# Patient Record
Sex: Female | Born: 1935
Health system: Southern US, Community
[De-identification: ages and names within clinical notes are randomized; demographics above are authoritative.]

## PROBLEM LIST (undated history)

## (undated) DIAGNOSIS — H9110 Presbycusis, unspecified ear: Secondary | ICD-10-CM

## (undated) DIAGNOSIS — H547 Unspecified visual loss: Secondary | ICD-10-CM

## (undated) DIAGNOSIS — H269 Unspecified cataract: Secondary | ICD-10-CM

## (undated) DIAGNOSIS — F0391 Unspecified dementia with behavioral disturbance: Secondary | ICD-10-CM

## (undated) HISTORY — DX: Unspecified dementia with behavioral disturbance: F03.91

## (undated) HISTORY — DX: Presbycusis, unspecified ear: H91.10

## (undated) HISTORY — DX: Unspecified cataract: H26.9

## (undated) HISTORY — DX: Unspecified visual loss: H54.7

---

## 2010-03-23 HISTORY — PX: CATARACT EXTRACTION: SUR2

## 2015-06-13 ENCOUNTER — Encounter: Payer: Self-pay | Admitting: Family Medicine

## 2015-06-13 ENCOUNTER — Ambulatory Visit (INDEPENDENT_AMBULATORY_CARE_PROVIDER_SITE_OTHER): Payer: Medicare Other | Admitting: Family Medicine

## 2015-06-13 VITALS — BP 153/72 | HR 67 | Temp 98.3°F | Wt 157.4 lb

## 2015-06-13 DIAGNOSIS — Z23 Encounter for immunization: Secondary | ICD-10-CM

## 2015-06-13 DIAGNOSIS — Z7689 Persons encountering health services in other specified circumstances: Secondary | ICD-10-CM

## 2015-06-13 DIAGNOSIS — R413 Other amnesia: Secondary | ICD-10-CM | POA: Diagnosis not present

## 2015-06-13 DIAGNOSIS — Z7189 Other specified counseling: Secondary | ICD-10-CM

## 2015-06-13 NOTE — Progress Notes (Signed)
Patient ID: Ebony PitchSawai Pierce, female   DOB: 09-May-1935, 80 y.o.   MRN: [id number]   [facility] Yolande Jollyaleb G Jamaine Quintin, MD Phone: [phone]  Subjective:   # Encounter to Establish Care - pt. Presents today to establish care.   Medical History - GERD  Surgical History - Tubal Ligation.   Medications - Multivitamin  Allergies - No known allergies  Family History Husband - DM, Brain Cancer - Deceased Parents - HTN  Social History - Does not Smoke or use tobacco products.  - No other illegal substances - No ETOH - No [place] nut.  - Lives in [place], and travels frequently back and forth to [place]. [place].   Upon ROS - daughter mentions that she has poor memory.  - She would like to get her to take the test for citizenship, but she is unsure if she can complete it since her cognitive function has been declining. - She is worried that her mother has dementia.  - She has been leaving the water on, forgetting that she is eating.   All relevant systems were reviewed and were negative unless otherwise noted in the HPI  Past Medical History Reviewed problem list.  Medications- reviewed and updated Current Outpatient Prescriptions  Medication Sig Dispense Refill  . MULTIPLE VITAMIN PO Take by mouth daily.     No current facility-administered medications for this visit.   Chief complaint-noted No additions to family history Social history- patient is a non smoker  Objective: BP 153/72 mmHg  Pulse 67  Temp(Src) 98.3 F (36.8 C) (Oral)  Wt 157 lb 6.4 oz (71.396 kg) Gen: NAD, alert, cooperative with exam HEENT: NCAT, EOMI, PERRL Neck: FROM, supple CV: RRR, good S1/S2, no murmur Resp: CTABL, no wheezes, non-labored Abd: SNTND, BS present, no guarding or organomegaly Ext: No edema, warm, normal tone, moves UE/LE spontaneously Neuro: Alert and oriented, No gross deficits Skin: no rashes no lesions  Assessment/Plan:  #  Healthcare Maintenance - Has had an EGD but no colonoscopy.  - Needs Flu vaccine - Needs Lipid panel - Needs hepatitis screening.  - CMET, CBC - Needs Dexa - Has had negative mammogram in the past.   # Poor Memory - CMET - CBC - B12, TSH, Folate - Hep C - RPR - Needs MMSE at next office visit.  - F/U for full eval in 1 month.

## 2015-06-13 NOTE — Patient Instructions (Signed)
Thanks for coming in today.   Make an appointment for one 2 weeks to one month to return for further mental evaluation.   We will get routine and screening lab work in the mean time.   On your way out today set up an appointment to get labs drawn one day next week.   If you need anything in the meantime just let me know.   Thanks for letting us take care of you.   Sincerely, Devota Pacealeb Ceniyah Thorp, MD Family Medicine - PGY 2

## 2015-06-18 ENCOUNTER — Other Ambulatory Visit: Payer: Medicare Other

## 2015-06-18 DIAGNOSIS — Z7689 Persons encountering health services in other specified circumstances: Secondary | ICD-10-CM

## 2015-06-18 DIAGNOSIS — R413 Other amnesia: Secondary | ICD-10-CM

## 2015-06-18 LAB — CBC
HEMATOCRIT: 42.6 % (ref 36.0–46.0)
HEMOGLOBIN: 14.4 g/dL (ref 12.0–15.0)
MCH: 29.4 pg (ref 26.0–34.0)
MCHC: 33.8 g/dL (ref 30.0–36.0)
MCV: 86.9 fL (ref 78.0–100.0)
MPV: 9.4 fL (ref 8.6–12.4)
Platelets: 345 10*3/uL (ref 150–400)
RBC: 4.9 MIL/uL (ref 3.87–5.11)
RDW: 14.1 % (ref 11.5–15.5)
WBC: 5.5 10*3/uL (ref 4.0–10.5)

## 2015-06-18 LAB — TSH: TSH: 0.91 m[IU]/L

## 2015-06-18 LAB — HEPATITIS PANEL, ACUTE
HCV AB: NEGATIVE
HEP B C IGM: NONREACTIVE
Hep A IgM: NONREACTIVE
Hepatitis B Surface Ag: NEGATIVE

## 2015-06-18 LAB — LIPID PANEL
CHOLESTEROL: 199 mg/dL (ref 125–200)
HDL: 51 mg/dL (ref >=46)
LDL Cholesterol: 124 mg/dL (ref <130)
TRIGLYCERIDES: 118 mg/dL (ref <150)
Total CHOL/HDL Ratio: 3.9 Ratio (ref <=5.0)
VLDL: 24 mg/dL (ref <30)

## 2015-06-18 LAB — COMPLETE METABOLIC PANEL WITH GFR
ALT: 10 U/L (ref 6–29)
AST: 19 U/L (ref 10–35)
Albumin: 3.7 g/dL (ref 3.6–5.1)
Alkaline Phosphatase: 65 U/L (ref 33–130)
BUN: 17 mg/dL (ref 7–25)
CALCIUM: 9.5 mg/dL (ref 8.6–10.4)
CHLORIDE: 103 mmol/L (ref 98–110)
CO2: 26 mmol/L (ref 20–31)
Creat: 0.83 mg/dL (ref 0.60–0.93)
GFR, Est African American: 79 mL/min (ref >=60)
GFR, Est Non African American: 68 mL/min (ref >=60)
Glucose, Bld: 97 mg/dL (ref 65–99)
POTASSIUM: 4.4 mmol/L (ref 3.5–5.3)
Sodium: 141 mmol/L (ref 135–146)
Total Bilirubin: 0.4 mg/dL (ref 0.2–1.2)
Total Protein: 7.2 g/dL (ref 6.1–8.1)

## 2015-06-18 LAB — PHOSPHORUS: Phosphorus: 3.8 mg/dL (ref 2.1–4.3)

## 2015-06-18 LAB — VITAMIN B12: VITAMIN B 12: 265 pg/mL (ref 200–1100)

## 2015-06-18 LAB — MAGNESIUM: Magnesium: 2 mg/dL (ref 1.5–2.5)

## 2015-06-18 NOTE — Progress Notes (Signed)
Labs done today Ebony Pierce 

## 2015-06-19 LAB — RPR

## 2015-06-20 ENCOUNTER — Telehealth: Payer: Self-pay | Admitting: Family Medicine

## 2015-06-20 NOTE — Telephone Encounter (Signed)
Called with normal lab results. Left VM. Told to call back if any questions.   CGM MD

## 2015-07-02 ENCOUNTER — Encounter: Payer: Self-pay | Admitting: Family Medicine

## 2015-07-02 ENCOUNTER — Ambulatory Visit (INDEPENDENT_AMBULATORY_CARE_PROVIDER_SITE_OTHER): Payer: Medicare Other | Admitting: Family Medicine

## 2015-07-02 VITALS — BP 138/75 | HR 66 | Temp 99.2°F | Ht 60.0 in | Wt 157.3 lb

## 2015-07-02 DIAGNOSIS — F03B0 Unspecified dementia, moderate, without behavioral disturbance, psychotic disturbance, mood disturbance, and anxiety: Secondary | ICD-10-CM

## 2015-07-02 DIAGNOSIS — F0390 Unspecified dementia without behavioral disturbance: Secondary | ICD-10-CM | POA: Diagnosis present

## 2015-07-02 DIAGNOSIS — F0391 Unspecified dementia with behavioral disturbance: Secondary | ICD-10-CM

## 2015-07-02 DIAGNOSIS — F03918 Unspecified dementia, unspecified severity, with other behavioral disturbance: Secondary | ICD-10-CM

## 2015-07-02 HISTORY — DX: Unspecified dementia with behavioral disturbance: F03.91

## 2015-07-02 HISTORY — DX: Unspecified dementia, unspecified severity, with other behavioral disturbance: F03.918

## 2015-07-02 NOTE — Assessment & Plan Note (Signed)
Pt. Here for follow up. She has moderate dementia by physical exam, history, and MMSE. She is very young. No known family history of early dementia.  - Daughter needs letter excluding her from taking written exam for citizenship test.  - Considering Aricept, may start soon.  - Referral to Geriatrics clinic for further evaluation and recommendations.  - No medications at this time except multivitamin.  - F/U in 4-6 weeks

## 2015-07-02 NOTE — Patient Instructions (Signed)
Thanks for coming in today.   We will refer you for evaluation in Geriatrics Clinic. Please set up an appointment with them on your way out.   I will write a letter, and the nurse will call you when this is ready.   You are very healthy otherwise.   Follow up with me in 4- 6 weeks.   Thanks for letting us take care of you.   Sincerely, Devota Pacealeb Mehtab Dolberry, MD Family Medicine - PGY 2

## 2015-07-02 NOTE — Progress Notes (Signed)
Patient ID: Ebony Pierce, female   DOB: 1935-06-03, 80 y.o.   MRN: [id number]   [facility] Yolande Jollyaleb G Nettye Flegal, MD Phone: [phone]  Subjective:   # F/U Dementia Evaluation - pt. Is here for follow up.  - She recently had screening labwork in addition to workup for dementia.  - All labs were normal at that time.  - Her daughter says that she is forgetting the faucet on at home at times.  - She forgets that she has taken her vitamins, or sometimes thinks that she has already taken them when she hasn't.  - She is able to dress herself, bathe herself, feed herself, and is involved in helping to cook and clean.  - She lives at home with her daughter and her daughter's husband.  - Her daughter worries about her during the day when she and her husband go to work.  - She speaks some english, but her primary language is [place].  - She graduated [employer] in [place].  - She has no other health problems.  - she does not smoke or drink alcohol.  - No family history of early dementia.  - [place] came [place] the [place] some [place] ago, but travels back and forth from here to [place].   All relevant systems were reviewed and were negative unless otherwise noted in the HPI  Past Medical History Reviewed problem list.  Medications- reviewed and updated Current Outpatient Prescriptions  Medication Sig Dispense Refill  . MULTIPLE VITAMIN PO Take by mouth daily.     No current facility-administered medications for this visit.   Chief complaint-noted No additions to family history Social history- patient is a non smoker  Objective: BP 138/75 mmHg  Pulse 66  Temp(Src) 99.2 F (37.3 C)  Ht 5' (1.524 m)  Wt 157 lb 4.8 oz (71.351 kg)  BMI 30.72 kg/m2  SpO2 97% Gen: NAD, alert, cooperative with exam HEENT: NCAT, EOMI, PERRL, TMs nml Neck: FROM, supple CV: RRR, good S1/S2, no murmur, cap refill <3 Resp: CTABL, no wheezes, non-labored Abd: SNTND, BS present, no guarding or  organomegaly Ext: No edema, warm, normal tone, moves UE/LE spontaneously Neuro: Alert and oriented, No gross deficits, Gait is appropriate, she walks without a cane.  MMSE: Score was 17, though should be noted that language barrier exists.  Skin: no rashes no lesions  Assessment/Plan:  Moderate dementia Pt. Here for follow up. She has moderate dementia by physical exam, history, and MMSE. She is very young. No known family history of early dementia.  - Daughter needs letter excluding her from taking written exam for citizenship test.  - Considering Aricept, may start soon.  - Referral to Geriatrics clinic for further evaluation and recommendations.  - No medications at this time except multivitamin.  - F/U in 4-6 weeks

## 2015-08-06 ENCOUNTER — Encounter: Payer: Self-pay | Admitting: Family Medicine

## 2015-08-06 ENCOUNTER — Ambulatory Visit (INDEPENDENT_AMBULATORY_CARE_PROVIDER_SITE_OTHER): Payer: Medicare Other | Admitting: Family Medicine

## 2015-08-06 VITALS — BP 133/79 | HR 76 | Temp 98.6°F | Wt 160.0 lb

## 2015-08-06 DIAGNOSIS — F0390 Unspecified dementia without behavioral disturbance: Secondary | ICD-10-CM

## 2015-08-06 NOTE — Patient Instructions (Signed)
Thanks for letting us take care of you.   We will get the form for Citizenship test waiver filled out for you and call you when it is ready.   I will refer her to Neuropsychology for evaluation and treatment.   You will be called with the appointment for the specialist.   Thanks for letting us take care of you and so good to see you today.   Sincerely, Devota Pacealeb Aerielle Stoklosa, MD Family Medicine - PGY 2

## 2015-08-06 NOTE — Progress Notes (Signed)
Patient ID: Ebony Pierce, female   DOB: [birth year], 80 y.o.   MRN: [id number]   [facility] Yolande Jollyaleb G Quetzal Meany, MD Phone: [phone]  Subjective:   # F/U Dementia - She returns for follow up.  - Has been more forgetful since last time.  - She does not want to start any new edications.  - no other changes.  - she has been forgetting things, and she will begin crying. - overall they continue to have a difficult time with her.  - No acting out, no other abnormal behaviors.    All relevant systems were reviewed and were negative unless otherwise noted in the HPI  Past Medical History Reviewed problem list.  Medications- reviewed and updated Current Outpatient Prescriptions  Medication Sig Dispense Refill  . MULTIPLE VITAMIN PO Take by mouth daily.     No current facility-administered medications for this visit.   Chief complaint-noted No additions to family history Social history- patient is a non smoker  Objective: BP 133/79 mmHg  Pulse 76  Temp(Src) 98.6 F (37 C) (Oral)  Wt 160 lb (72.576 kg) Gen: NAD, alert, cooperative with exam HEENT: NCAT, EOMI, PERRL, TMs nml Neck: FROM, supple CV: RRR, good S1/S2, no murmur Resp: CTABL, no wheezes, non-labored Abd: SNTND, BS present, no guarding or organomegaly Ext: No edema, warm, normal tone, moves UE/LE spontaneously Neuro: Alert and oriented, No gross deficits Skin: no rashes no lesions  MMSE at last visit.   Assessment/Plan:  # Dementia - unspecified type. Most likely to be AD, but the context of this diagnosis is related to her needing to take a test for citizenship. Would favor having her evaluated by neuropsychology.  - Sidelined Dr. Perley JainMcDiarmid - Bo MerinoGeri attending who mentioned that Neuropsych would probably be a good idea for her.  - Holding off on starting any medication at this time, initial laboratory workup negative.  - No imaging at this time.  - Will work on citizenship documentation  with Dr. Gwendolyn GrantWalden as able.

## 2015-08-16 ENCOUNTER — Ambulatory Visit: Payer: Medicare Other | Admitting: Family Medicine

## 2015-08-21 ENCOUNTER — Encounter: Payer: Self-pay | Admitting: Family Medicine

## 2015-08-21 ENCOUNTER — Ambulatory Visit (INDEPENDENT_AMBULATORY_CARE_PROVIDER_SITE_OTHER): Payer: Medicare Other | Admitting: Family Medicine

## 2015-08-21 VITALS — BP 137/77 | HR 64 | Temp 98.1°F | Wt 159.0 lb

## 2015-08-21 DIAGNOSIS — F0390 Unspecified dementia without behavioral disturbance: Secondary | ICD-10-CM | POA: Diagnosis not present

## 2015-08-21 DIAGNOSIS — F03B0 Unspecified dementia, moderate, without behavioral disturbance, psychotic disturbance, mood disturbance, and anxiety: Secondary | ICD-10-CM

## 2015-08-21 NOTE — Patient Instructions (Signed)
Thanks for coming in today.   We will fill out the paperwork and will send it to the Immigrant law clinic [facility].   We will call you when we have completed the paperwork and sent it to them.   In the meantime, follow up as needed.   Work on creating as familiar a space as possible for her.   Thanks for letting us take care of you.   Sincerely, Devota Pacealeb Narda Fundora, MD Family Medicine -PGY 2

## 2015-08-21 NOTE — Progress Notes (Signed)
Patient ID: Ebony Pierce, female   DOB: 12-31-1935, 80 y.o.   MRN: [id number]   [facility] Yolande Jollyaleb G Emberleigh Reily, MD Phone: [phone]  Subjective:   # Dementia  - Pt. Returns today for follow up evaluation and need for paperwork to be filled out.   PMH: GERD in the past - now resolved PSH: Eye surgery (unknown date - pt. Thinks it was pterygium removal), Tubal ligation (unknown date) Allergies - crab, no known medicine allergies Social - no Tobacco or ETOH Family - Husband (deceased - brain cancer, DMII), Parents - HTN  Hospitalizations - was hospitalized for delivery of her children, and also tubal ligation. Otherwise no significant hospitalization.   Home Life - [place] She was born and raised there. She completed high school there, she can write in [place] but has a hard time seeing.   Came to the [place] after daughter applied for visa in 2009.   - She continues to have poor memory function per daughter.  - The patient agrees that she is forgetful.  - Her poor memory has progressed over the past year.  - She is unable to leave the house by her self per the daughter due to fear that she will get lost.  - she does not drive here in the [place] as the patient and daughter do not feel this would be safe.  - Her daughter now cooks for her as she has begun to forget recipes and how to cook for herself  - She is able to bathe herself - She has forgotten the water on in the bathtub before.  - Additionally, she has left the stove on in the house two times.  - She is hard of hearing. This has been going on for the past 4-5 years with gradual onset.  - She has significant difficulty seeing as well. This also has been of gradual onset.  - She has not fallen at home [place] in the [place].  - She has no numbness or tingling peripherally.   MMSE performed at previous visit - 17.   Interpreter used for the entire visit - [place] : ID [id number] [employer].   All relevant systems were reviewed and were negative unless otherwise noted in the HPI  Past Medical History Reviewed problem list.  Medications- reviewed and updated Current Outpatient Prescriptions  Medication Sig Dispense Refill  . MULTIPLE VITAMIN PO Take by mouth daily.     No current facility-administered medications for this visit.   Chief complaint-noted No additions to family history Social history- patient is a non smoker  Objective: BP 137/77 mmHg  Pulse 64  Temp(Src) 98.1 F (36.7 C) (Oral)  Wt 159 lb (72.122 kg)  SpO2 100% Gen: NAD, alert, cooperative with exam HEENT: NCAT, EOMI, PERRL, Pterygium medial in the right eye without intrusion on the iris, arcus sinilis bilaterally, TM's with tympanic sclerosis without perforation or irritation of the TM or canal. No cerumen.  Neck: FROM, supple, no LAD  CV: RRR, good S1/S2, Grade 2 SEM, no JVD Resp: CTABL though slight crackles in the left lower base, no wheezes, non-labored Abd: SNTND, BS present, no guarding or organomegaly Ext: No edema, warm, normal tone, moves UE/LE spontaneously Neuro: Alert and oriented, CN II-XII tested and in tact, Motor with 5/5 motor in all major muscle groups, no focal sensory deficits, Patellar reflexes 2+ bilaterally, ambulation without diffculity.  Skin: no rashes no lesions  Assessment/Plan:  # Moderate  Dementia - she meets this criteria by history and physical exam.  - Recommend creating as familiar an environment as possible.  - remove trip hazards.  - Pt. Will require ongoing 24 hours supervision. She lives with her daughter and her husband.  - Had referred to neuropsych to see her at last visit.  - Pending neuropsych eval for consideration of medication intervention.   # Paperwork - pt. / daughter Requesting Form [id number] to be filled out - Medical Certification for Disability Exceptions. DHS.  - Will fill out paperwork. - Additionally, referred to [facility] immigration  law clinic.   Visit performed with attending Dr. Renold Don present through the entire visit.   Greater than 40 minutes spent in face to face consultation with the patient at this visit.

## 2015-08-27 ENCOUNTER — Ambulatory Visit: Payer: Medicare Other | Admitting: Family Medicine

## 2015-09-26 ENCOUNTER — Telehealth: Payer: Self-pay | Admitting: Psychology

## 2015-09-26 ENCOUNTER — Telehealth: Payer: Self-pay | Admitting: Family Medicine

## 2015-09-26 NOTE — Telephone Encounter (Signed)
At last visit, pt was told her "case" would be sent to [facility] [employer].  Daughter is following up and wondering what is the status of her case

## 2015-09-26 NOTE — Telephone Encounter (Signed)
Will forward to Dr. Gwendolyn GrantWalden who was present with previous PCP during this visit.  Will need to find out if this referral was done. Jazmin Hartsell,CMA

## 2015-09-26 NOTE — Telephone Encounter (Signed)
Ms. Ebony Pierce was scheduled for neurocognitive evaluation tomorrow on 09/27/15. Patient's daughter called today to cancel stating that she has to go out of town with the [employer] and will not be able to bring her mother in. The daughter stated that she will call back in the future to reschedule.

## 2015-09-27 ENCOUNTER — Ambulatory Visit: Payer: Medicare Other | Admitting: Psychology

## 2015-09-27 NOTE — Telephone Encounter (Signed)
I have forwarded the case to the law office.  Their coordinator is out of town until Tuesday of next week.  They should hear something back then.  They can call their office at Phone: [phone]

## 2015-10-01 NOTE — Telephone Encounter (Signed)
Daughter is aware of this. Ebony Pierce,CMA  

## 2015-11-11 ENCOUNTER — Telehealth: Payer: Self-pay | Admitting: Family Medicine

## 2015-11-11 NOTE — Telephone Encounter (Signed)
Will forward to Dr. Gwendolyn GrantWalden to advise. Brantlee Penn,CMA

## 2015-11-11 NOTE — Telephone Encounter (Signed)
Daughter has talked to [employer] [facility] [employer] and they will not take her mothers case. She needs a "form" completed by Dr Gwendolyn GrantWalden. This form is to be taken to another immigration service that was recommend by [facility].  Daughter says Dr Gwendolyn GrantWalden will know what form she is talking about. Please contact daughter when the form is ready to be picked up.f

## 2015-11-13 NOTE — Telephone Encounter (Signed)
I will make a copy of her form and place it for her to be picked up.  Unfortunately I'm not back in the office until Friday so won't be able to do it until then.

## 2015-11-27 NOTE — Telephone Encounter (Signed)
Daughter is calling back to check the status of the forms she needed to fill out. Please let her know once this is done. jw

## 2015-11-27 NOTE — Telephone Encounter (Signed)
Routing to Dr. Gwendolyn GrantWalden, I did not see this up front.  Just checking to see if you had put it up there. Lamonte SakaiZimmerman Rumple, April D, [place]

## 2015-11-28 NOTE — Telephone Encounter (Signed)
Somehow this fell off my list and out of my inbox.  I will complete this tomorrow.

## 2015-11-28 NOTE — Telephone Encounter (Signed)
Pt's daughter would like someone to call her as soon as form is completed. [phone] Please advise. Thanks! ep

## 2015-12-03 NOTE — Telephone Encounter (Signed)
I have completed this and left it up front for the patient.

## 2015-12-09 NOTE — Telephone Encounter (Signed)
Daughter informed. Will pick up form Tues. Sunday SpillersSharon T Amily Depp, CMA

## 2016-09-16 ENCOUNTER — Encounter: Payer: Self-pay | Admitting: Student

## 2016-09-16 ENCOUNTER — Ambulatory Visit (INDEPENDENT_AMBULATORY_CARE_PROVIDER_SITE_OTHER): Payer: Medicaid Other | Admitting: Student

## 2016-09-16 DIAGNOSIS — H547 Unspecified visual loss: Secondary | ICD-10-CM | POA: Diagnosis not present

## 2016-09-16 DIAGNOSIS — F03B0 Unspecified dementia, moderate, without behavioral disturbance, psychotic disturbance, mood disturbance, and anxiety: Secondary | ICD-10-CM

## 2016-09-16 DIAGNOSIS — F0390 Unspecified dementia without behavioral disturbance: Secondary | ICD-10-CM | POA: Diagnosis present

## 2016-09-16 HISTORY — DX: Unspecified visual loss: H54.7

## 2016-09-16 NOTE — Assessment & Plan Note (Addendum)
Concern for reduced vision - patient to have a formal eye exam with optometry

## 2016-09-16 NOTE — Patient Instructions (Addendum)
Follow up with geriatrics clinic Call the office with questions or concerns

## 2016-09-16 NOTE — Progress Notes (Signed)
   Subjective:    Patient ID: Ebony Pierce, female    DOB: 06/17/1935, 81 y.o.   MRN: [id number]   CC: memory issues  HPI: 81 y/o F presents with daughter for concern of memory issues  Memory issues - seen for this over 1 year ago, at that time MMSE was 17 however limited due to language barrier - she did to not follow up after that - daughter feels she has gotten worse - she is able to dress and toilet her self, but daughter does not trust her with money or to cook   Reduced vision - she feels she has difficulty with vision  - she has not seen an eye doctor  Smoking status reviewed  Review of Systems  Per HPI, else denies recent illness, fever, chest pain, shortness of breath,    Objective:  BP 100/64   Pulse 71   Temp 98.6 F (37 C) (Oral)   Wt 155 lb (70.3 kg)   SpO2 98%   BMI 30.27 kg/m  Vitals and nursing note reviewed  General: NAD Cardiac: RRR, Respiratory: CTAB, normal effort Extremities: no edema or cyanosis. WWP. Skin: warm and dry, no rashes noted Neuro: alert  no focal deficits MMSE performed with score 12, but unable to perform 3 of the 1 point tasks due to language barried ( repeat phrase no ifs ands or buts, read this and do what it says, make up a sentence and write it)  Vision - able to count fingers on the examiners hand with each eye closed successively - able to copy diagram portion of the MMSE without issue   Assessment & Plan:    Moderate dementia Will refer to geriatrics clinic. Appt made today - will test and treat as needed  Reduced vision Concern for reduced vision - patient to have a formal eye exam with optometry    Ebony Pascale A. Kennon RoundsHaney MD, MS Family Medicine Resident PGY-3 Pager [phone]

## 2016-09-16 NOTE — Assessment & Plan Note (Signed)
Will refer to geriatrics clinic. Appt made today - will test and treat as needed

## 2016-09-21 ENCOUNTER — Encounter: Payer: Medicare Other | Admitting: Family Medicine

## 2016-09-24 ENCOUNTER — Ambulatory Visit: Payer: Medicaid Other

## 2016-10-08 ENCOUNTER — Ambulatory Visit: Payer: Medicaid Other

## 2016-10-19 ENCOUNTER — Ambulatory Visit (INDEPENDENT_AMBULATORY_CARE_PROVIDER_SITE_OTHER): Payer: Medicaid Other | Admitting: Family Medicine

## 2016-10-19 ENCOUNTER — Encounter: Payer: Self-pay | Admitting: Family Medicine

## 2016-10-19 VITALS — BP 156/88 | HR 61 | Temp 98.1°F | Ht 60.0 in | Wt 157.0 lb

## 2016-10-19 DIAGNOSIS — H259 Unspecified age-related cataract: Secondary | ICD-10-CM | POA: Diagnosis not present

## 2016-10-19 DIAGNOSIS — H9110 Presbycusis, unspecified ear: Secondary | ICD-10-CM | POA: Insufficient documentation

## 2016-10-19 DIAGNOSIS — E2839 Other primary ovarian failure: Secondary | ICD-10-CM | POA: Diagnosis not present

## 2016-10-19 DIAGNOSIS — H9113 Presbycusis, bilateral: Secondary | ICD-10-CM | POA: Diagnosis not present

## 2016-10-19 DIAGNOSIS — Z23 Encounter for immunization: Secondary | ICD-10-CM

## 2016-10-19 DIAGNOSIS — H547 Unspecified visual loss: Secondary | ICD-10-CM | POA: Diagnosis not present

## 2016-10-19 DIAGNOSIS — R030 Elevated blood-pressure reading, without diagnosis of hypertension: Secondary | ICD-10-CM | POA: Diagnosis not present

## 2016-10-19 HISTORY — DX: Presbycusis, unspecified ear: H91.10

## 2016-10-19 MED ORDER — TETANUS-DIPHTH-ACELL PERTUSSIS 5-2.5-18.5 LF-MCG/0.5 IM SUSP: 0.5000 mL | Freq: Once | INTRAMUSCULAR | 0 refills | Status: AC

## 2016-10-19 NOTE — Patient Instructions (Addendum)
It was good to see you today!  For your eyes, - I have placed a referral for an eye doctor, please let us know if you do not hear back about an appointment in 2 weeks  For your general health - Please get your bone density scan done - You can get your tetanus booster at your pharmacy - Thank you for getting your pneumonia vaccine today   Please check-out at the front desk before leaving the clinic. Make an appointment in 3 months.  Sign up for My Chart to have easy access to your labs results, and communication with your primary care physician.  Feel free to call with any questions or concerns at any time, at [phone].   Take care,  Dr. Leland HerElsia J Yoo, DO [facility]

## 2016-10-19 NOTE — Progress Notes (Signed)
    Subjective:  Ebony Pierce is a 81 y.o. female who [facility].  HPI:  Vision changes - Has h/o cataracts, had R cataract surgery in [place] in the past - Thinks having more age related changes but thinks could also be cataracts. - Blurry vision without peripheral vision loss or double vision.  - no HA, lightheadness, dizziness, or falls - wants to see eye doctor but does not know where to go, would like referral  Hearing loss - Per daughter, patient has been complaining of not being able to hear as well as she used to - Interacts socially appropriately, only rarely does not hear things.  Health maintenance  - Would like PNA vaccine today  - Amenable to getting TDAP at pharmacy - Agreeable to DEXA scan   ROS: Per HPI. No CP, SOB, abd pain, n/v/d  Objective:  Physical Exam: BP (!) 154/88   Pulse 61   Temp 98.1 F (36.7 C) (Oral)   Ht 5' (1.524 m)   Wt 157 lb (71.2 kg)   BMI 30.66 kg/m  Recheck BP 156/88 Gen: NAD, resting comfortably HEENT: [place], AT. PERRL, EOMI. Hearing intact to voice bilaterally CV: RRR with no murmurs appreciated Pulm: NWOB, CTAB with no crackles, wheezes, or rhonchi GI: Normal bowel sounds present. Soft, Nontender, Nondistended. MSK: no edema, cyanosis, or clubbing noted Skin: warm, dry Neuro: grossly normal, moves all extremities Psych: Normal affect and thought content   Assessment/Plan:  Age-related hearing loss Patient interactive and able to participate well in conversation today. Suspect age related decline that is mild, no role for further assessment at this time.  Reduced vision Patient needs formal evaluation by optometry, referral placed.  Elevated blood-pressure reading without diagnosis of hypertension BP today appears more elevated that past visits. Regardless, goal would be <150/90 given patient's age and lack of other risk factors. Will monitor at next visit as well and hold off starting any  medications at this time.  Health maintenance  - PCV13 given today - TDAP sent to pharmacy - DEXA ordered   3 month follow up to recheck BP   Leland HerElsia J Cielle Aguila, DO PGY-2, [facility] 10/19/2016 4:05 PM

## 2016-10-20 DIAGNOSIS — R030 Elevated blood-pressure reading, without diagnosis of hypertension: Secondary | ICD-10-CM | POA: Insufficient documentation

## 2016-10-20 NOTE — Assessment & Plan Note (Signed)
Patient needs formal evaluation by optometry, referral placed.

## 2016-10-20 NOTE — Assessment & Plan Note (Signed)
BP today appears more elevated that past visits. Regardless, goal would be <150/90 given patient's age and lack of other risk factors. Will monitor at next visit as well and hold off starting any medications at this time.

## 2016-10-20 NOTE — Assessment & Plan Note (Signed)
Patient interactive and able to participate well in conversation today. Suspect age related decline that is mild, no role for further assessment at this time.

## 2016-11-12 ENCOUNTER — Ambulatory Visit (INDEPENDENT_AMBULATORY_CARE_PROVIDER_SITE_OTHER): Payer: Medicaid Other | Admitting: Family Medicine

## 2016-11-12 VITALS — BP 128/70 | HR 65 | Temp 98.2°F | Ht 60.0 in | Wt 156.0 lb

## 2016-11-12 DIAGNOSIS — F0391 Unspecified dementia with behavioral disturbance: Secondary | ICD-10-CM

## 2016-11-12 DIAGNOSIS — R413 Other amnesia: Secondary | ICD-10-CM | POA: Diagnosis present

## 2016-11-13 ENCOUNTER — Encounter: Payer: Self-pay | Admitting: Family Medicine

## 2016-11-13 NOTE — Progress Notes (Signed)
Patient ID: Ebony Pierce, female   DOB: January 14, 1936, 81 y.o.   MRN: [id number] [facility]:   Patient is accompanied by: daughter, Remi Deter Interpreter: Simmaly - Lang. Res.  Primary caregiver: daughter and son-in-law Patient's lives with their family. Patient information was obtained from patient, relative(s) and past medical records. History/Exam limitations: dementia. Primary Care Provider: Leland Her, DO Referring provider: Einar Pheasant, DO Reason for referral:  Chief Complaint  Patient presents with  . Memory Loss    History Chief Complaint  Patient presents with  . Memory Loss    HPI by problems:   Cognitive impairment concern What problems with thinking are there?  memory loss  When were the changes first noticed?  last year   Did this change occur abruptly or gradually?  gradual  How have the changes progressed since then?  gradually worsening  Does their level of alertness change throughout the day?  no  Is their speech disorganized, rambling?  no  Has there been any tremors or abnormal movements?  no  Have they had in hallucinations or delusions:  no  Have they appeared more anxious or sad lately?  no  Do they still have interests or activities they enjor doing?  yes  How has their appetite been lately?  show no change  How has their sleep been lately?  No change  Problem behaviors:  Suspiciousness: hides her money, then forgets where she hid it.  Suspicious that it may have been stolen.    Compared to 5 to 10 years ago, how is the patient at:  Problems with Judgment, e.g., problem making decisions, bad financial decisions, problems with thinking? are worsening   Less interested in hobbies or previously enjoyed activities? show no change   Trouble remembering appointments? are worsening    Remembering things about family and friends e.g. names,  occupations, birthdays, addresses?  are  worsening  Remembering things that have happened recently?  are worsening  Recalling conversations a few days later?  are worsening  Remembering what day and month it is? are worsening  Remembering where things are usually kept?  are worsening  Losing things?  are worsening  Learning to use a new gadget or machine around the house,e.g., computer, microwave, remote control?  are worsening  Learning new things in general?  are worsening  Following a story in a book or on TV?  are worsening  Handling money for shopping?  are worsening  Handling financial matters, e.g. their pension,  dealing with the bank?  are worsening  Able to cope with unexpected events?  are worsening  Getting lost?  Not a problem  Asking same questions repeatedly or telling  the same story repeatedly to the same person(s)?  yes     Outpatient Encounter Prescriptions as of 11/12/2016  Medication Sig  . MULTIPLE VITAMIN PO Take by mouth daily.     History Patient Active Problem List   Diagnosis Date Noted  . Elevated blood-pressure reading without diagnosis of hypertension 10/20/2016  . Age-related hearing loss 10/19/2016  . Reduced vision 09/16/2016  . Moderate dementia 07/02/2015   Past Medical History:  Diagnosis Date  . Cataract    s/p R cataract surgery   Past Surgical History:  Procedure Laterality Date  . CATARACT EXTRACTION Right 2012   Family History  Problem Relation Age of Onset  . Hypertension Mother    Social History   Social History  . Marital status: Widowed  Spouse name: N/A  . Number of children: N/A  . Years of education: 34   Social History Main Topics  . Smoking status: Never Smoker  . Smokeless tobacco: Never Used  . Alcohol use No  . Drug use: No  . Sexual activity: Not Currently   Other Topics Concern  . None   Social History Narrative   Lives at home with daughter and son-in-law.     Cardiovascular Risk Factors: None  Personal  History of Seizures: No -  Personal History of Stroke: No -  Personal History of Head Trauma: No -  Personal History of Psychiatric Disorders: No -  Educational History: 12 years formal education  Basic Activities of Daily Living  AADLs IIndependent [employer] Assistance DDependent  Bbathing xx    Ddressing xx    AAmbulation xx    TToileting xx    EEating xx         Instrumental Activities of Daily Living IADL Independent Needs Assistance Dependent  Cooking   x  Housework   x  Manage Medications   x  Manage the telephone   x  Shopping for food, clothes, Meds, etc   x  Use transportation   x  Manage Finances   x    Caregivers in home: daughter   FALLS in last five office visits:  Fall Risk  11/12/2016 09/16/2016 08/21/2015 08/06/2015 07/02/2015  Falls in the past year? No No No No No    Health Maintenance reviewed: Immunization History  Administered Date(s) Administered  . Influenza,inj,Quad PF,6+ Mos 06/13/2015  . Pneumococcal Conjugate-13 10/19/2016   Health Maintenance Topics with due status: Overdue     Topic Date Due   TETANUS/TDAP 07/06/1956   DEXA SCAN 07/07/2002   Health Maintenance Topics with due status: Due On     Topic Date Due   INFLUENZA VACCINE 10/21/2016    Diet: Regular Nutritional supplements: none  ROS Denies fevers/chills; denies changes in appetite; denies changes in weight;  Denies changes hearing / smell / taste; (+) vision change Denies runny nose / ear pain or discharge / sore throat / sinus congestion / cough/w phlegm; Denies chest congestion / wheezing;  Denies chest pain; denies heart beating slower/thumps inside chest; denies racing heart/flutter; Denies dysuria; denies hematuria;  Denies constipation; denies melena/hematochezia; denies diarrhea;  Denies abdominal discomfort/gaseous bloating; denies N/V; denies heart burn;  Denies recent falls/unsteady gait;  Denies unilateral weakness / clumsiness / tingling / numbness; denies  tremors;  Denies sadness / anxiety / suicidal tendencies  Vital Signs Weight: 156 lb (70.8 kg) Body mass index is 30.47 kg/m. CrCl cannot be calculated (Patient's most recent lab result is older than the maximum 21 days allowed.). Body surface area is 1.73 meters squared. Vitals:   11/12/16 1543  BP: 128/70  Pulse: 65  Temp: 98.2 F (36.8 C)  TempSrc: Oral  SpO2: 98%  Weight: 156 lb (70.8 kg)  Height: 5' (1.524 m)   Wt Readings from Last 3 Encounters:  11/12/16 156 lb (70.8 kg)  10/19/16 157 lb (71.2 kg)  09/16/16 155 lb (70.3 kg)    Hearing Screening   Method: Audiometry   125Hz  250Hz  500Hz  1000Hz  2000Hz  3000Hz  4000Hz  6000Hz  8000Hz   Right ear:   40 40 40  40    Left ear:   0 0 0  0    Vision Screening Comments: C/o of blurry vision and waiting to see ophtho.   Physical Examination:  VS reviewed GEN: Alert, Cooperative, Groomed, NAD  HEENT: PERRL; EAC bilaterally not occluded, TM's translucent with normal LM, (+) LR;                No cervical LAN, No thyromegaly, No palpable masses COR: RRR, No M/G/R, No JVD, Normal PMI size and location LUNGS: BCTA, No Acc mm use, speaking in full sentences Gait: No significant path deviation, Step-through present   Mini-Mental State Examination or Montreal Cognitive Assessment:  Patient did  require additional cues or prompts to complete tasks. Patient was cooperative and attentive to testing tasks Patient did  appear motivated to perform well   Labs  Lab Results  Component Value Date   VITAMINB12 265 06/18/2015    No results found for: FOLATE  Lab Results  Component Value Date   TSH 0.91 06/18/2015    No results found for: RPR    Chemistry      Component Value Date/Time   NA 141 06/18/2015 0845   K 4.4 06/18/2015 0845   CL 103 06/18/2015 0845   CO2 26 06/18/2015 0845   BUN 17 06/18/2015 0845   CREATININE 0.83 06/18/2015 0845      Component Value Date/Time   CALCIUM 9.5 06/18/2015 0845   ALKPHOS 65  06/18/2015 0845   AST 19 06/18/2015 0845   ALT 10 06/18/2015 0845   BILITOT 0.4 06/18/2015 0845        Lab Results  Component Value Date   WBC 5.5 06/18/2015   HGB 14.4 06/18/2015   HCT 42.6 06/18/2015   MCV 86.9 06/18/2015   PLT 345 06/18/2015    Results for orders placed or performed in visit on 11/12/16 (from the past 24 hour(s))  Sedimentation Rate   Collection Time: 11/12/16  5:12 PM  Result Value Ref Range   Sed Rate 13 0 - 40 mm/hr  HIV antibody (with reflex)   Collection Time: 11/12/16  5:12 PM  Result Value Ref Range   HIV Screen 4th Generation wRfx Non Reactive Non Reactive  VITAMIN D 25 Hydroxy (Vit-D Deficiency, Fractures)   Collection Time: 11/12/16  5:12 PM  Result Value Ref Range   Vit D, 25-Hydroxy 35.1 30.0 - 100.0 ng/mL  Methylmalonic Acid, Serum   Collection Time: 11/12/16  5:12 PM  Result Value Ref Range   Methylmalonic Acid WILL FOLLOW    Disclaimer: Comment     Imaging No head nor brain imaging   Assessment and Plan: Problem List Items Addressed This Visit    None    Visit Diagnoses    Memory difficulty    -  Primary   Relevant Orders   Sedimentation Rate (Completed)   HIV antibody (with reflex) (Completed)   VITAMIN D 25 Hydroxy (Vit-D Deficiency, Fractures) (Completed)   Methylmalonic Acid, Serum (Completed)      Personal Strengths Active sense of humor Supportive family/friends  Support System Strengths Family   40 minutes face to face where spent in total with counseling / coordination of care took more than 50% of the total time. Counseling involved discussion differential diagnosis, testing, prognosis, adherence, risk reduction, benefits of treatment, instructions, compliance.

## 2016-11-13 NOTE — Assessment & Plan Note (Addendum)
Evidence of progression of cognitive and functional impairments from daughter, Ebony Pierce, report since around last year. MoCa 10/30 greater than two standard deviations from mean.  Cognitive test's limitation in this situation was need for interpretation and question of cross-cultural appropriateness of some test components.  Mild-to-Moderate stage of Dementia with resultant impaired abilities instrumental activities of daily living finances, housekeeping, reading, shopping, telephoning and travel outside home. Patient remains independent in ADLs.   Behavioral and Psychological Symptoms are primarily suspiciousness related to hiding then forgetting where she hid money.  toleratance, reassurance and distraction are ways to manage these situations.  Have patient keep her money in a very conspicuous container/purse or attaching GPS Tile to container helps find the money when the hiding place is forgotten.   - Counseled patient and family regarding the diagnosis of dementia and progressive nature of the neurodegenerative disorder.  Provided packet of materials assembled for patients and families with more information about dementia and resources to assist with coping with impairments and behaviors. Ebony Pierce gave verbal permission to send a referral in to the Alzheimer's Association to contact her about resources and support in the community for caregivers of others with Dementia.    We did not have time to start the discussion with patient and her daughter about advanced planning for patient's financial estate, future legal questions of competency and desired medical care the patient desires currently for resuscitation and for future care, including health care power of attorney agent, should patient become severely incapacitated.  Dr Artist Pais may start these discussions on future office visits.

## 2016-11-15 LAB — HIV ANTIBODY (ROUTINE TESTING W REFLEX): HIV Screen 4th Generation wRfx: NONREACTIVE

## 2016-11-15 LAB — METHYLMALONIC ACID, SERUM: METHYLMALONIC ACID: 1524 nmol/L — AB (ref 0–378)

## 2016-11-15 LAB — SEDIMENTATION RATE: Sed Rate: 13 mm/hr (ref 0–40)

## 2016-11-15 LAB — VITAMIN D 25 HYDROXY (VIT D DEFICIENCY, FRACTURES): VIT D 25 HYDROXY: 35.1 ng/mL (ref 30.0–100.0)

## 2016-12-23 ENCOUNTER — Encounter: Payer: Self-pay | Admitting: Family Medicine

## 2016-12-23 NOTE — Telephone Encounter (Signed)
Daughter is calling to see when the letter might be ready for pick up. She doesn't want to leave her mother there any longer than possible since she doesn't even know where she is at.jw

## 2016-12-29 ENCOUNTER — Telehealth: Payer: Self-pay

## 2016-12-29 NOTE — Telephone Encounter (Signed)
Patient called in to say that she was concerned that her nephew who is 37 would not be considered an adult appropriate to bring her mother back into the [place].  I told her that he is considered an adult her in the states and if there was any problem that his name needs to be added to the letter to let us know.Glennie Hawk

## 2017-04-02 ENCOUNTER — Ambulatory Visit: Payer: Medicare Other | Admitting: Family Medicine

## 2017-04-02 ENCOUNTER — Other Ambulatory Visit: Payer: Self-pay

## 2017-04-02 ENCOUNTER — Encounter: Payer: Self-pay | Admitting: Family Medicine

## 2017-04-02 VITALS — BP 118/80 | HR 68 | Temp 98.4°F | Wt 144.0 lb

## 2017-04-02 DIAGNOSIS — F0391 Unspecified dementia with behavioral disturbance: Secondary | ICD-10-CM | POA: Diagnosis present

## 2017-04-02 NOTE — Progress Notes (Signed)
    Subjective:  Ebony Pierce is a 82 y.o. female who [facility] the [facility] with paperwork.  HPI:  Daughter is historian. Has paperwork for [employer] for Disabled Adults (CAP/DA) that they received and would like some assistance filling out. No new concerns today.   ROS: Per HPI  Objective:  Physical Exam: BP 118/80   Pulse 68   Temp 98.4 F (36.9 C) (Oral)   Wt 144 lb (65.3 kg)   SpO2 98%   BMI 28.12 kg/m   Gen: NAD, resting comfortably Neuro: grossly normal, moves all extremities  Assessment/Plan:  Dementia with behavioral disturbance Precepted with Dr. McDiarmid who recommended submitting form to Sammuel Hineseborah Moore CSW. Form placed in her inbox today. Appreciate assistance. Informed daughter that we would call regarding any updates.   Ebony HerElsia J Saphronia Ozdemir, DO PGY-2, [facility] 04/02/2017 4:52 PM

## 2017-04-02 NOTE — Patient Instructions (Signed)
I'll give the form to our social worker Sammuel HinesDeborah Moore. She will call you if she has any questions.

## 2017-04-02 NOTE — Assessment & Plan Note (Addendum)
Precepted with Dr. McDiarmid who recommended submitting form to Sammuel Hineseborah Moore CSW. Form placed in her inbox today. Appreciate assistance. Informed daughter that we would call regarding any updates.

## 2017-04-06 ENCOUNTER — Telehealth: Payer: Self-pay | Admitting: Licensed Clinical Social Worker

## 2017-04-06 ENCOUNTER — Telehealth: Payer: Self-pay | Admitting: *Deleted

## 2017-04-06 NOTE — Progress Notes (Signed)
Type of Service: Clinical Social Work  Social work consult from Dr. Artist PaisYoo reference assisting patient's daughter with completing CAP application.   LCSW called patient's daughter Remi DeterBo Mills to assess the needs for the above consult.   Says she only needs the medical part of the form completed which is "Conditions and Related Support Needs".  This section is best completed by PCP which ask for ICD coded for diagnosis, Hospitalization, etc.    LCSW will complete other sections of the application based on information received from the daughter. Form placed in mailbox with blue tabs for the section PCP need to complete. Update provided to PCP via in-basket.    Sammuel Hineseborah Garl Speigner, LCSW Licensed Clinical Social Worker [facility]   [phone] 11:51 AM

## 2017-04-06 NOTE — Telephone Encounter (Signed)
-----   Message from Leland HerElsia J Yoo, DO sent at 04/06/2017  1:51 PM EST ----- Regarding: CAP application   ----- Message ----- From: Soundra PilonMoore, Deborah H, LCSW Sent: 04/06/2017  10:36 AM To: Leighton Roachodd D McDiarmid, MD, Leland HerElsia J Yoo, DO Subject: CAP application                                 I reviewed the form and called patient's dau for clarification on how I could help with completing it. Dau. states you told her I needed to complete the form.  Says she only needs the medical part completed which is "Conditions and Related Support Needs".  She states she can complete everything else.   This section is best completed by PCP which ask for ICD coded for diagnosis, Hospitalization, etc.    I will complete the sections based on information the daughter shared with me over the phone and place the form back in your mailbox with blue tabs for the section you need to complete.  Please let me know if you have any question.  Gavin Poundeborah    ----- Message ----- From: Leland HerYoo, Elsia J, DO Sent: 04/02/2017   6:35 PM To: Soundra Piloneborah H Moore, LCSW  Deborah,  Dr. McDiarmid suggested that I bring this form to you. Please let me know if you need anything from me.   Thank you, Jeneen RinksElsia Yoo

## 2017-04-06 NOTE — Telephone Encounter (Signed)
Daughter informed that paperwork is completed and ready for pick up.  Storie Heffern,CMA

## 2017-04-06 NOTE — Progress Notes (Signed)
Form completed and ready for pick up. Can daughter please be informed that I will place up at front desk.

## 2017-04-07 NOTE — Progress Notes (Signed)
Type of Service: Clinical Social Work  LCSW received phone call from patient's daughter Morton PetersBo, states appreciative of assistance with form.   The following was discussed Personal Care Services and other options while she waits for CAP services.    Plan: Daughter will think about PCS as an option and call back if she decides to move forward with the application/referral.  Sammuel Hineseborah Cordney Barstow, LCSW Licensed Clinical Social Worker [facility]   [phone] 3:36 PM

## 2017-05-26 ENCOUNTER — Ambulatory Visit: Payer: Medicare Other | Admitting: Internal Medicine

## 2017-06-24 ENCOUNTER — Other Ambulatory Visit: Payer: Self-pay

## 2017-06-24 ENCOUNTER — Encounter: Payer: Self-pay | Admitting: Family Medicine

## 2017-06-24 ENCOUNTER — Ambulatory Visit (INDEPENDENT_AMBULATORY_CARE_PROVIDER_SITE_OTHER): Payer: Medicare Other | Admitting: Family Medicine

## 2017-06-24 VITALS — BP 118/68 | HR 70 | Temp 98.1°F | Wt 146.4 lb

## 2017-06-24 DIAGNOSIS — Z23 Encounter for immunization: Secondary | ICD-10-CM

## 2017-06-24 DIAGNOSIS — Z0001 Encounter for general adult medical examination with abnormal findings: Secondary | ICD-10-CM | POA: Diagnosis not present

## 2017-06-24 MED ORDER — TETANUS-DIPHTH-ACELL PERTUSSIS 5-2-15.5 LF-MCG/0.5 IM SUSP: 0.5000 mL | Freq: Once | INTRAMUSCULAR | 0 refills | Status: AC

## 2017-06-24 NOTE — Progress Notes (Signed)
Subjective:  Ebony Pierce is a 82 y.o. female who presents to the [facility] today for annual wellness. Daughter is historian.  HPI:  Overall doing well recently. Paperwork for CAP services was denied and while hopeful for appeal would like to proceed with Personal Care Services. No acute concerns. Wants to see a dentist. Needs to see an eye doctor for her cataracts. No acute vision changes, has difficulty seeing at baseline. Still has memory problems and hearing problems but has not significantly worsened.   ROS: per HPI, otherwise all systems reviewed and negative  Past Medical History:  Diagnosis Date  . Age-related hearing loss 10/19/2016  . Cataract    s/p R cataract surgery  . Dementia with behavioral disturbance 07/02/2015   MoCA 10/30 (Administered with assistance of [place] interpreter in exam room (11/12/16)  . Reduced vision 09/16/2016   Social History   Socioeconomic History  . Marital status: Widowed    Spouse name: Not on file  . Number of children: Not on file  . Years of education: 101  . Highest education level: Not on file  Occupational History  . Not on file  Social Needs  . Financial resource strain: Not on file  . Food insecurity:    Worry: Not on file    Inability: Not on file  . Transportation needs:    Medical: Not on file    Non-medical: Not on file  Tobacco Use  . Smoking status: Never Smoker  . Smokeless tobacco: Never Used  Substance and Sexual Activity  . Alcohol use: No    Alcohol/week: 0.0 oz  . Drug use: No  . Sexual activity: Not Currently  Lifestyle  . Physical activity:    Days per week: 0 days    Minutes per session: Not on file  . Stress: Not on file  Relationships  . Social connections:    Talks on phone: Not on file    Gets together: Not on file    Attends religious service: Not on file    Active member of club or organization: Not on file    Attends meetings of clubs or organizations: Not on file    Relationship status:  Not on file  . Intimate partner violence:    Fear of current or ex partner: Not on file    Emotionally abused: Not on file    Physically abused: Not on file    Forced sexual activity: Not on file  Other Topics Concern  . Not on file  Social History Narrative   Lives at home with daughter and son-in-law.    Enjoys karaoke     Objective:  Physical Exam: BP 118/68   Pulse 70   Temp 98.1 F (36.7 C) (Oral)   Wt 146 lb 6.4 oz (66.4 kg)   SpO2 99%   BMI 28.59 kg/m   Gen: NAD, resting comfortably CV: RRR with no murmurs appreciated Pulm: NWOB, CTAB with no crackles, wheezes, or rhonchi GI: Normal bowel sounds present. Soft, Nontender, Nondistended. MSK: no edema, cyanosis, or clubbing noted Skin: warm, dry Neuro: grossly normal, moves all extremities Psych: Normal affect and thought content   Assessment/Plan:  Encounter for general adult medical examination with abnormal findings Overall stable and doing well.  -Personal Care Services form completed and placed in fax pile today.  -tetanus booster sent to patient pharmacy -instructed daughter to take patient for DEXA -dental resources list given today  -[place] to [place] was [place] in July 2018,  daughter to call if needs new referral    Leland HerElsia J Pamelia Botto, DO PGY-2, [facility] 06/24/2017 3:15 PM

## 2017-06-24 NOTE — Patient Instructions (Addendum)
[facility]  [address], [place], [place] [address] Phone: [phone]  Melatonin at bedtime. Available over the counter.  Please get your bone density scan.  Dental Resources Dentistry from the Heart Free dental services provided to individuals in need.  13  DHMoore 06/11/17  [email or web address]    Free Dentistry Day Free dental days hosted by dental practices. http://www.freedentistryday.org/dates.php [address] [place] [facility]) [phone] Portable free dental clinics through [facility] . Provides free dental care. To see upcoming clinic dates, visit   [email or web address].ncdental.org/meetings-events/ncmissionsof-mercy    Donated Dental Services Applicants must not be able to afford dental care.  Applicants must be permanently disabled, elderly, or medically fragile.  Accepting applications for Eli Lilly and Companymilitary veterans and/or people who cannot receive essential medical treatment due to their dental condition, e.g., a kidney transplant. [phone] RipHit.[email or web address]-content/uploads/[email or web address]/08/[place].pdf Applications being accepted in [facility]    The [place] [employer]'s (AACDCF) Give Back a Smile (GBAS) program heals some of the most devastating effects of domestic and sexual violence by restoring the smiles of adult women and men who have suffered dental injuries from a former intimate partner/spouse, family member, or due to sexual assault. [phone] or http://www.aacd.com/aboutGBAS    [facility] [facility] [phone] ext. [phone] or jhoneycutt@ncbaptist .org    [facility] Erskine Emery/[facility] Card  (need referral from PCP)  Monday - Tuesday (8am-5pm)Evening Clinics (Tuesday and Thursday) [address]  [place], [place] [address] [phone] . Must be eligible for the [facility]     [facility]   [phone]  ext [phone]  Services offered  include: Oral prophylaxis (teeth cleaning), Pit and fissure sealants, Dental x-rays, Fluoride treatments, Plaque control instruction . Appointment times vary semester-to-semester    [facility] [phone] Information, Appointments and fees [phone] [facility] Information Desk February - June ONLY: clinic provides additional services, including complete exams, fillings, extractions, root canals, crowns, and temporary partial fillings.     [facility]  14  [facility] 06/11[facility]17  [address] [place], [place] [address] Free dental clinic provided by the [facility] [employer] [facility]. GreatReverseMortgage.[email or web address]://www.med.unc.edu/shac/services/clinics/dental Patients selected by a lottery system.  Those with urgent dental needs should call [facility] at [phone]  Monday-Friday 8:00 am-5:00 pm.  Modest fee charged

## 2017-06-25 DIAGNOSIS — Z0001 Encounter for general adult medical examination with abnormal findings: Secondary | ICD-10-CM | POA: Insufficient documentation

## 2017-06-25 LAB — BASIC METABOLIC PANEL
BUN/Creatinine Ratio: 13 (ref 12–28)
BUN: 14 mg/dL (ref 8–27)
CO2: 25 mmol/L (ref 20–29)
Calcium: 9.8 mg/dL (ref 8.7–10.3)
Chloride: 102 mmol/L (ref 96–106)
Creatinine, Ser: 1.04 mg/dL — ABNORMAL HIGH (ref 0.57–1.00)
GFR calc Af Amer: 59 mL/min/{1.73_m2} — ABNORMAL LOW (ref >59)
GFR, EST NON AFRICAN AMERICAN: 51 mL/min/{1.73_m2} — AB (ref >59)
GLUCOSE: 94 mg/dL (ref 65–99)
POTASSIUM: 4.9 mmol/L (ref 3.5–5.2)
SODIUM: 140 mmol/L (ref 134–144)

## 2017-06-25 LAB — CBC
HEMATOCRIT: 42.8 % (ref 34.0–46.6)
Hemoglobin: 14.1 g/dL (ref 11.1–15.9)
MCH: 31.3 pg (ref 26.6–33.0)
MCHC: 32.9 g/dL (ref 31.5–35.7)
MCV: 95 fL (ref 79–97)
Platelets: 343 10*3/uL (ref 150–379)
RBC: 4.5 x10E6/uL (ref 3.77–5.28)
RDW: 12.8 % (ref 12.3–15.4)
WBC: 5.8 10*3/uL (ref 3.4–10.8)

## 2017-06-25 NOTE — Assessment & Plan Note (Signed)
Overall stable and doing well.  -Personal Care Services form completed and placed in fax pile today.  -tetanus booster sent to patient pharmacy -instructed daughter to take patient for DEXA -dental resources list given today  -[facility] [facility] [place] [facility] in July 2018, daughter to call if needs new referral

## 2017-06-28 ENCOUNTER — Encounter: Payer: Self-pay | Admitting: Family Medicine

## 2017-12-28 ENCOUNTER — Telehealth: Payer: Self-pay | Admitting: *Deleted

## 2017-12-28 DIAGNOSIS — F0390 Unspecified dementia without behavioral disturbance: Secondary | ICD-10-CM

## 2017-12-28 NOTE — Telephone Encounter (Signed)
Acknowledged. Would like to discuss with Dr. Perley Jain

## 2017-12-28 NOTE — Telephone Encounter (Signed)
Pt lmovm for a callback.  No additional info left.   LMOVM for callback and more info. Fleeger, Maryjo [place], CMA

## 2017-12-28 NOTE — Telephone Encounter (Signed)
pts daughter in law calls to discuss dementia meds.  These were discussed @ her Bo Merino appt last year and at the time she was not interested.    She states that her mom has gotten worse and will leave the house when she is not there.  She is interested in trying the medications now.  Will forward to Dr. McDiarmid and Dr. Artist Pais.  Fleeger, Maryjo Rochester, CMA

## 2017-12-29 MED ORDER — DONEPEZIL HCL 5 MG PO TABS: 5.0000 mg | ORAL_TABLET | Freq: Every day | ORAL | 1 refills | Status: DC

## 2017-12-29 NOTE — Telephone Encounter (Signed)
Pt daughter in law is returning Dr. Olegario Messier call.

## 2017-12-29 NOTE — Telephone Encounter (Signed)
Attempted to call daughter, no response.  Left a message asking for her to call back

## 2017-12-29 NOTE — Addendum Note (Signed)
Addended by: Leland Her on: 12/29/2017 03:35 PM   Modules accepted: Orders

## 2017-12-29 NOTE — Telephone Encounter (Signed)
Will forward to MD. Jazmin Hartsell,CMA  

## 2017-12-29 NOTE — Telephone Encounter (Signed)
Pt daughter is caling back and said if Dr. Artist Pais is going to call her before 5:30 please call her work office number which is [phone]. When she calls this number please ask for Bo.

## 2017-12-29 NOTE — Telephone Encounter (Signed)
Late entry. When I had spoken to the daughter earlier she mentioned wanted to contact Gavin Pound CSW regarding reapplication for CAP services. Will forward.

## 2017-12-29 NOTE — Telephone Encounter (Signed)
The difference in placebo and active arms for dementia medications is about 3 points on a 70 point scale.  Global assessments of improvement by physician or family have shown no difference.  While there is a significant difference with new starts or acetylcholinesterase inhibitors in advanced dementia, but the effect is likely less than in new starts in earlier dementia.  With that said, if a medication is started, have an agreed to target behavior against which the success of the treatment may be judged, like out of chair more, less agitated, more cooperative with care, etc..    If in the judgement of the caretaker, there is no improvement in this target behavior at 2 months, then stop the medication.   If there is judged to be a difference, then consider continuing it for a year, with a trial off at the end to see if it is still perceived to be helping.

## 2017-12-29 NOTE — Telephone Encounter (Signed)
Called and spoke with daughter. Patient memory appears to have worsened as daughter went to store and the patient ended up at the neighbor's house. Discussed trial for aricept for 2 months, target behavior will be hopefully will be less repetitive questions.  She currently keeps repeating the same thing over and over again and sometimes gets paranoid. Discussed that if patient is at risk or harming herself or others that daughter needs to contact the office.

## 2017-12-30 NOTE — Telephone Encounter (Signed)
Gavin Pound CSW recommended that daughter will need to apply for CAP program at [facility]. The CAP program has a long waiting list. Daughter was informed of this in the past but may need a reminder. Can daughter please be informed.

## 2018-01-03 NOTE — Telephone Encounter (Signed)
Spoke with daughter and reminded her about the CAP process.  She voiced understanding and will call them today. Jazmin Hartsell,CMA

## 2018-01-28 ENCOUNTER — Telehealth: Payer: Self-pay | Admitting: Family Medicine

## 2018-01-28 NOTE — Telephone Encounter (Signed)
Form for CAP services found in paper inbox. Physician section completed but missing patient/daughter signatures. Placed upfront for pick up along with information regarding Personal Care Services. Please inform daughter to pick up form.  If daughter would like PCS then can initiate that process as well. Needs a visit within 90 days, if they want PCS then please help them schedule an appointment.

## 2018-01-31 NOTE — Telephone Encounter (Signed)
Daughter informed that form is ready for pick up.  Jazmin Hartsell,CMA

## 2018-04-04 ENCOUNTER — Other Ambulatory Visit: Payer: Self-pay

## 2018-04-04 DIAGNOSIS — F0390 Unspecified dementia without behavioral disturbance: Secondary | ICD-10-CM

## 2018-04-05 MED ORDER — DONEPEZIL HCL 5 MG PO TABS: 5.0000 mg | ORAL_TABLET | Freq: Every day | ORAL | 0 refills | Status: DC

## 2018-04-05 NOTE — Telephone Encounter (Signed)
Needs follow up appt to see if any improvement on aricept.

## 2018-04-06 NOTE — Telephone Encounter (Signed)
Tried to call daughter Ebony Pierce but there was no answer or voicemail available.  Mescal Flinchbaugh,CMA

## 2018-04-14 ENCOUNTER — Ambulatory Visit (INDEPENDENT_AMBULATORY_CARE_PROVIDER_SITE_OTHER): Payer: Medicare Other | Admitting: Family Medicine

## 2018-04-14 ENCOUNTER — Encounter: Payer: Self-pay | Admitting: Family Medicine

## 2018-04-14 ENCOUNTER — Other Ambulatory Visit: Payer: Self-pay

## 2018-04-14 DIAGNOSIS — R634 Abnormal weight loss: Secondary | ICD-10-CM | POA: Diagnosis not present

## 2018-04-14 DIAGNOSIS — H547 Unspecified visual loss: Secondary | ICD-10-CM

## 2018-04-14 DIAGNOSIS — F0390 Unspecified dementia without behavioral disturbance: Secondary | ICD-10-CM | POA: Diagnosis not present

## 2018-04-14 DIAGNOSIS — Z638 Other specified problems related to primary support group: Secondary | ICD-10-CM | POA: Diagnosis not present

## 2018-04-14 DIAGNOSIS — F0391 Unspecified dementia with behavioral disturbance: Secondary | ICD-10-CM

## 2018-04-14 HISTORY — DX: Abnormal weight loss: R63.4

## 2018-04-14 MED ORDER — DONEPEZIL HCL 10 MG PO TABS: 10.0000 mg | ORAL_TABLET | Freq: Every day | ORAL | 3 refills | Status: DC

## 2018-04-14 NOTE — Assessment & Plan Note (Signed)
Daughter has not yet been to any support groups.  She is feeling very stressed.

## 2018-04-14 NOTE — Progress Notes (Signed)
Established Patient Office Visit  Subjective:  Patient ID: Ebony Pierce, female    DOB: 08/09/1935  Age: 83 y.o. MRN: [id number]  CC:  Chief Complaint  Patient presents with  . not eating much    HPI Ebony Pierce presents for worsening dementia and more 1. Worsening dementia.  Less communicative per daughter who is also acting a Nurse, learning disabilitytranslator.  Started on aricept x 1 month.  No side effects but has not yet seen improvement.  Has had the blood work for reversible causes of dementia.  No head imaging yet.   2. Weight loss.  Forgets to eat.  Lives with daughter who prepares breakfast and lunch then goes to work.  Comes home and mom has often not eaten.  When I mentioned, daughter agrees that mom does not drink much either.  Reviewed flow sheet.  Has 20 lb wt loss over past 2 years but still has normal BMI 3. Visual loss.  Previous cataract surgery.  Seems to be worsening vision.  Will refer back to ophthalmology.  Past Medical History:  Diagnosis Date  . Age-related hearing loss 10/19/2016  . Cataract    s/p R cataract surgery  . Dementia with behavioral disturbance 07/02/2015   [facility] 10/30 (Administered with assistance of [place] interpreter in exam room (11/12/16)  . Reduced vision 09/16/2016    Past Surgical History:  Procedure Laterality Date  . CATARACT EXTRACTION Right 2012    Family History  Problem Relation Age of Onset  . Hypertension Mother     Social History   Socioeconomic History  . Marital status: Widowed    Spouse name: Not on file  . Number of children: Not on file  . Years of education: 6812  . Highest education level: Not on file  Occupational History  . Not on file  Social Needs  . Financial resource strain: Not on file  . Food insecurity:    Worry: Not on file    Inability: Not on file  . Transportation needs:    Medical: Not on file    Non-medical: Not on file  Tobacco Use  . Smoking status: Never Smoker  . Smokeless tobacco: Never Used    Substance and Sexual Activity  . Alcohol use: No    Alcohol/week: 0.0 standard drinks  . Drug use: No  . Sexual activity: Not Currently  Lifestyle  . Physical activity:    Days per week: 0 days    Minutes per session: Not on file  . Stress: Not on file  Relationships  . Social connections:    Talks on phone: Not on file    Gets together: Not on file    Attends religious service: Not on file    Active member of club or organization: Not on file    Attends meetings of clubs or organizations: Not on file    Relationship status: Not on file  . Intimate partner violence:    Fear of current or ex partner: Not on file    Emotionally abused: Not on file    Physically abused: Not on file    Forced sexual activity: Not on file  Other Topics Concern  . Not on file  Social History Narrative   Lives at home with daughter and son-in-law.    Enjoys karaoke     Outpatient Medications Prior to Visit  Medication Sig Dispense Refill  . MULTIPLE VITAMIN PO Take by mouth daily.    Marland Kitchen. donepezil (ARICEPT) 5 MG tablet Take 1  tablet (5 mg total) by mouth at bedtime. 30 tablet 0   No facility-administered medications prior to visit.     No Known Allergies  ROS Review of Systems    Objective:    Physical Exam  BP (!) 114/58   Pulse 62   Temp 98.1 F (36.7 C) (Oral)   Ht 5' (1.524 m)   Wt 140 lb 6.4 oz (63.7 kg)   SpO2 96%   BMI 27.42 kg/m  Wt Readings from Last 3 Encounters:  04/14/18 140 lb 6.4 oz (63.7 kg)  06/24/17 146 lb 6.4 oz (66.4 kg)  04/02/17 144 lb (65.3 kg)  Lungs clear [place] RRR without m or g   Health Maintenance Due  Topic Date Due  . TETANUS/TDAP  07/06/1956  . DEXA SCAN  07/07/2002  . PNA vac Low Risk Adult (2 of 2 - PPSV23) 10/19/2017  . INFLUENZA VACCINE  10/21/2017    There are no preventive care reminders to display for this patient.  Lab Results  Component Value Date   TSH 0.91 06/18/2015   Lab Results  Component Value Date   WBC 5.8  06/24/2017   HGB 14.1 06/24/2017   HCT 42.8 06/24/2017   MCV 95 06/24/2017   PLT 343 06/24/2017   Lab Results  Component Value Date   NA 140 06/24/2017   K 4.9 06/24/2017   CO2 25 06/24/2017   GLUCOSE 94 06/24/2017   BUN 14 06/24/2017   CREATININE 1.04 (H) 06/24/2017   BILITOT 0.4 06/18/2015   ALKPHOS 65 06/18/2015   AST 19 06/18/2015   ALT 10 06/18/2015   PROT 7.2 06/18/2015   ALBUMIN 3.7 06/18/2015   CALCIUM 9.8 06/24/2017   Lab Results  Component Value Date   CHOL 199 06/18/2015   Lab Results  Component Value Date   HDL 51 06/18/2015   Lab Results  Component Value Date   LDLCALC 124 06/18/2015   Lab Results  Component Value Date   TRIG 118 06/18/2015   Lab Results  Component Value Date   CHOLHDL 3.9 06/18/2015   No results found for: HGBA1C    Assessment & Plan:   Problem List Items Addressed This Visit    Visual loss   Relevant Orders   Ambulatory referral to Ophthalmology   Dementia with behavioral disturbance (HCC)   Relevant Medications   donepezil (ARICEPT) 10 MG tablet    Other Visit Diagnoses    Dementia without behavioral disturbance, unspecified dementia type (HCC)       Relevant Medications   donepezil (ARICEPT) 10 MG tablet      Meds ordered this encounter  Medications  . donepezil (ARICEPT) 10 MG tablet    Sig: Take 1 tablet (10 mg total) by mouth at bedtime.    Dispense:  30 tablet    Refill:  3    Follow-up: No follow-ups on file.    Moses Manners, MD

## 2018-04-14 NOTE — Progress Notes (Addendum)
Subjective:     Ebony Pierce, is a 83 y.o. female   History provider by patient and daughter Daughter translated for patient.  Chief Complaint  Patient presents with  . not eating much    HPI:  Reduced Appetite  Ebony Pierce is an 83 yo F with Dementia accompanied by Daughter who presents with complaints of eating less, worsened vision, and continued memory impairment. Her daughter states every morning she cooks her mother breakfast and lunch. Over the last week, she has come home in the evening to find both meals were either wholly untouched or only slightly eaten. When asked about not eating, Pt tells daughter she forgot to eat, gets full fast, or did not have an appetite. Some days her daughter has come home for lunch and has eaten with her mother, which leads to Pt eating most of a meal. She had 2 teeth pulled in the last year but has healed well and reports minimal difficulty chewing, no pain. She reports no diarrhea, blood in stool, nausea, vomiting. She has lost about 15 pounds over last 2 years.  Memory Loss  Daughter also reports continued memory loss with little to no benefit of Donepezil. She finds her mother is often brushing her teeth multiple times a day as she forgets she has already done it. One incident stands out where daughter stated she was going to [employer]. While she was at the store, she received a call from a neighbor who said Pt had gone to her house in tears as she could not find her daughter. Daughter also reports occasional urinary incontinence and repeating same statements multiple times.  Vision Loss Patient's main complaint is with her vision. She has a hx of cataracts including surgery in [place] 3-4 years prior to fix. However now she reports decreased vision BL with poor night vision. She reports no eye pain, itchiness, or increased tearing.  ROS negative besides previously mentioned in HPI  Patient's history was reviewed and updated as  appropriate: allergies, current medications, past family history, past medical history, past social history, past surgical history and problem list.     Objective:     BP (!) 114/58   Pulse 62   Temp 98.1 F (36.7 C) (Oral)   Ht 5' (1.524 m)   Wt 140 lb 6.4 oz (63.7 kg)   SpO2 96%   BMI 27.42 kg/m   Physical Exam Constitutional:      General: She is not in acute distress. Eyes:     Comments: BL cloudy lens, white lesion in R eye   Cardiovascular:     Rate and Rhythm: Normal rate. Rhythm irregular.     Heart sounds: Normal heart sounds.  Pulmonary:     Effort: Pulmonary effort is normal.     Breath sounds: Normal breath sounds.  Abdominal:     General: Bowel sounds are normal.     Tenderness: There is no abdominal tenderness.  Neurological:     Mental Status: She is alert.       Assessment & Plan:   Ms. Zablocki is an 83yo F who presents with worsening dementia and vision loss. While dementia is unspecified, reversible causes have been ruled out and MOCA score of 10/30 in 2018 supports diagnosis of dementia. Vision loss is likely due to cataracts given visible cloudiness and lesion in lens.  Dementia -Discussed with daughter the worsening trajectory of Pt's memory loss -Increase Donepezil dose to 10mg  daily -Follow up in 1 month to  evaluate benefit of meds -Will fill out paperwork for Personal Care Services  Cataracts -Will refer to Ophthalmology  Supportive care and return precautions reviewed.  No follow-ups on file.  Barbara Cower, Medical Student

## 2018-04-14 NOTE — Assessment & Plan Note (Signed)
Refer back to ophthalmology 

## 2018-04-14 NOTE — Patient Instructions (Addendum)
Your mom has dementia.  It will get worse over time.  I hope the medicine helps some. I sent in a new prescription for a higher dose of the medicine she on.  She can use up the pills she has by taking two pills per day. See [place] again in one month to see if the higher dose is helping.  There is a second medicine that we might add at that time.   I will fill out the form for personal care services and call when it is ready.   We commonly see weight loss with dementia.  You are right, she needs to be reminded to eat.  She has not lost an important amount weight yet.  I would like it if she would quit losing weight.  You might want to look into dementia support groups.  ARDA - is [employer]heimers and Related Disorders Association support groups.

## 2018-04-14 NOTE — Assessment & Plan Note (Signed)
Increase aricept

## 2018-04-14 NOTE — Assessment & Plan Note (Signed)
Almost certainly due to progressive dementia.

## 2018-04-15 ENCOUNTER — Telehealth: Payer: Self-pay | Admitting: *Deleted

## 2018-04-15 NOTE — Telephone Encounter (Signed)
Dr. Leveda Anna has completed his portion of form and returned to RN clinic.  Clinical portion completed and placed in Fax pile.  Copy made for batch scanning. Fleeger, Maryjo Rochester, CMA

## 2018-05-18 ENCOUNTER — Ambulatory Visit: Payer: Medicare Other | Admitting: Family Medicine

## 2018-05-25 ENCOUNTER — Ambulatory Visit (INDEPENDENT_AMBULATORY_CARE_PROVIDER_SITE_OTHER): Payer: Medicare Other | Admitting: Family Medicine

## 2018-05-25 ENCOUNTER — Encounter: Payer: Self-pay | Admitting: Family Medicine

## 2018-05-25 ENCOUNTER — Other Ambulatory Visit: Payer: Self-pay

## 2018-05-25 VITALS — BP 120/66 | Temp 98.3°F | Wt 139.0 lb

## 2018-05-25 DIAGNOSIS — Z23 Encounter for immunization: Secondary | ICD-10-CM | POA: Diagnosis not present

## 2018-05-25 DIAGNOSIS — F0391 Unspecified dementia with behavioral disturbance: Secondary | ICD-10-CM | POA: Diagnosis not present

## 2018-05-25 DIAGNOSIS — H547 Unspecified visual loss: Secondary | ICD-10-CM | POA: Diagnosis not present

## 2018-05-25 NOTE — Progress Notes (Signed)
    Subjective:  Ebony Pierce is a 83 y.o. female who presents to the [facility] today for dementia follow-up.  Daughter is historian.  HPI:  Seems to be improving on increased Aricept dosing.  She is taking 10 mg daily, compliant and tolerating well.  She is still having some memory issues and forgetfulness.  She will try to bathe herself and forget to rinse out the soap.  She will forget to eat. Daughter only recently found out that they have been denied for personal care services because the patient had received the mail and did not know what it was and hid it. They have not yet heard back about scheduling for an eye appointment. Patient fell recently and scraped up her right shin.  Daughter thinks it is because she could not see where she was going and there are many steps in their house.  They have been putting railings up through CAP services.  ROS: Per HPI   Objective:  Physical Exam: BP 120/66   Temp 98.3 F (36.8 C) (Oral)   Wt 139 lb (63 kg)   BMI 27.15 kg/m   Gen: NAD, resting comfortably CV: RRR with no murmurs appreciated Pulm: NWOB, CTAB with no crackles, wheezes, or rhonchi GI: Normal bowel sounds present. Soft, Nontender, Nondistended. MSK: no edema, cyanosis, or clubbing noted Skin: warm, dry. Shallow 2 small abrasions scabbed over and healing well on R shin without erythema.  Assessment/Plan:  Dementia with behavioral disturbance Doing better on increased dose of aricept. Continue at 10mg  daily. Daughter to appeal PCS denial.  Visual loss Referral went to [facility] Daughter to call and inquire about scheduling appointment.   Leland Her, DO PGY-3, [facility] 05/25/2018 3:52 PM

## 2018-05-25 NOTE — Assessment & Plan Note (Signed)
Doing better on increased dose of aricept. Continue at 10mg  daily. Daughter to appeal PCS denial.

## 2018-05-25 NOTE — Patient Instructions (Addendum)
[facility] Address: [address] [place], [place], [place] [address] Phone: [phone]  Continue aricept

## 2018-05-25 NOTE — Assessment & Plan Note (Signed)
Referral went to [facility]. Daughter to call and inquire about scheduling appointment.

## 2018-07-05 ENCOUNTER — Telehealth: Payer: Self-pay | Admitting: Family Medicine

## 2018-07-05 DIAGNOSIS — F0390 Unspecified dementia without behavioral disturbance: Secondary | ICD-10-CM

## 2018-07-05 NOTE — Telephone Encounter (Signed)
Daughter states that patient has been accepted for CAP services. Needs shower chair and shampoo chair. Placed printed rx up front for pickup by daughter

## 2019-01-03 ENCOUNTER — Other Ambulatory Visit: Payer: Self-pay

## 2019-01-03 ENCOUNTER — Ambulatory Visit (INDEPENDENT_AMBULATORY_CARE_PROVIDER_SITE_OTHER): Payer: Medicare Other | Admitting: Family Medicine

## 2019-01-03 ENCOUNTER — Encounter: Payer: Self-pay | Admitting: Family Medicine

## 2019-01-03 VITALS — BP 122/58 | HR 80 | Wt 138.8 lb

## 2019-01-03 DIAGNOSIS — F05 Delirium due to known physiological condition: Secondary | ICD-10-CM

## 2019-01-03 DIAGNOSIS — Z23 Encounter for immunization: Secondary | ICD-10-CM

## 2019-01-03 DIAGNOSIS — F0391 Unspecified dementia with behavioral disturbance: Secondary | ICD-10-CM

## 2019-01-03 NOTE — Patient Instructions (Addendum)
Today we gave you a card to speak to our financial coordinator.  Were also including some information below people you can call.     Insurance can be expensive---there are resources for you!   You can always call our clinic to schedule an appointment with Leory Plowman, who can help with insurance questions and applications for assistance programs.  You can also schedule with her at the front desk.   Department of Social Services in [place] Address: [address], [place], [place] [address] Phone: [phone]  Medication [place] (Front Desk at [facility]) about this  [employer] you to obtain medications from the health department at low cost and see doctors in the [employer]  Your orange card gives you access to everything that the [facility] has to offer. All you need to do to take part in your health care is to give [place] a call at [phone].  Pharmaceutical Companies--- [employer] and [employer] have insulin available---please check out online resources for this    We also gave you a written prescription for the shower chair, and told that we do not think that there is anything that requires significant treatment going on with mom's feet.

## 2019-01-05 ENCOUNTER — Telehealth: Payer: Self-pay

## 2019-01-05 DIAGNOSIS — Z23 Encounter for immunization: Secondary | ICD-10-CM | POA: Insufficient documentation

## 2019-01-05 DIAGNOSIS — F05 Delirium due to known physiological condition: Secondary | ICD-10-CM | POA: Insufficient documentation

## 2019-01-05 MED ORDER — MELATONIN 5 MG PO CAPS: 1.0000 | ORAL_CAPSULE | Freq: Every day | ORAL | 0 refills | Status: DC

## 2019-01-05 NOTE — Telephone Encounter (Signed)
Community message sent to [employer] and Darlina Guys @ [facility] to process DME order for shower stool.

## 2019-01-05 NOTE — Assessment & Plan Note (Signed)
Patient and daughter both say that patient often gets confused.  They mention that she does not have much of an appetite although there is minimal weight loss over the last 6 months.  They are requesting assistance with getting home health aides and resources given her advancing dementia.  Her daughter does have a job.  I filled out [place] access program referral sheet which will be faxed over to [facility].  I have also printed DME shower chair form for patient to hand to her home health program.  I will refer this chart to social work to ensure that patient is connected with home health agency as I was unable to locate one in the epic chart.

## 2019-01-05 NOTE — Progress Notes (Signed)
 *  Patient was offered video interpreter but refused and requested that her daughter translate for her instead. Subjective:  Ebony Pierce is a 83 y.o. female who presents to the [facility] today with a chief complaint of wanting a DME order for shower chair and home health assistance.Marland Kitchen   HPI: Sundowning Daughter says her mom will often wander in the middle the night, she has been safe so far but her daughter is concerned that she is not sleeping.  They would like to talk about getting something for sleep.  Dementia with behavioral disturbance Patient and daughter both say that patient often gets confused.  They mention that she does not have much of an appetite although there is minimal weight loss over the last 6 months.  They are requesting assistance with getting home health aides and resources given her advancing dementia.  Her daughter does have a job, she thinks that they are establishing with the cap program but is unsure, she is also stating that she thinks she has a home health agency but does not remember their name.  Need for immunization against influenza Consents to flu shot  Objective:  Physical Exam: BP (!) 122/58   Pulse 80   Wt 138 lb 12.8 oz (63 kg)   SpO2 96%   BMI 27.11 kg/m   Gen: NAD, resting comfortably CV: RRR with no murmurs appreciated Pulm: NWOB, CTAB with no crackles, wheezes, or rhonchi GI: Normal bowel sounds present. Soft, Nontender, Nondistended. MSK: no edema, cyanosis, or clubbing noted Skin: warm, dry Neuro: grossly normal, moves all extremities Psych: Pleasant and conversing but clear signs of dementia, not able to give reliable history herself.  No results found for this or any previous visit (from the past 72 hour(s)).   Assessment/Plan:  Need for immunization against influenza Consents to flu shot  Sundowning Daughter says her mom will often wander in the middle the night, she has been safe so far but her daughter is concerned that she is  not sleeping.  They would like to talk about getting something for sleep.  We will start with 5 mg melatonin which she said she could buy over-the-counter.  Dementia with behavioral disturbance Patient and daughter both say that patient often gets confused.  They mention that she does not have much of an appetite although there is minimal weight loss over the last 6 months.  They are requesting assistance with getting home health aides and resources given her advancing dementia.  Her daughter does have a job.  I filled out [place] access program referral sheet which will be faxed over to [facility].  I have also printed DME shower chair form for patient to hand to her home health program.  I will refer this chart to social work to ensure that patient is connected with home health agency as I was unable to locate one in the epic chart.   Sherene Sires, DO FAMILY MEDICINE RESIDENT - PGY3 01/05/2019 9:09 AM

## 2019-01-05 NOTE — Assessment & Plan Note (Signed)
Consents to flu shot

## 2019-01-05 NOTE — Assessment & Plan Note (Signed)
Daughter says her mom will often wander in the middle the night, she has been safe so far but her daughter is concerned that she is not sleeping.  They would like to talk about getting something for sleep.  We will start with 5 mg melatonin which she said she could buy over-the-counter.

## 2019-01-10 ENCOUNTER — Telehealth: Payer: Self-pay | Admitting: Licensed Clinical Social Worker

## 2019-01-10 NOTE — Telephone Encounter (Signed)
   Care Coordination Phone outreach Note Social Work   01/10/2019 Name: Ebony Pierce MRN: [id number] DOB: 1935-08-20  Ebony Pierce is a 83 y.o. year old female who sees Sherene Sires, DO for primary care. LCSW was consulted for assistance with care coordination related to shower chair and home health. Called patient's daughter to assess needs and barriers. Assessment : Patient received shower chair yesterday.  No home health needs at this time.  Patient is receiving [employer] for disabled adults ( CAPS) via the department of social services who is paying patient's daughter to provider personal care needs. Per daughter they do not have a need for home health services at this time. Interventions:Patient interviewed and appropriate assessments performed Plan: No further follow up required: by LCSW    Dr. Criss Rosales has been informed of this outreach and plan.  Casimer Lanius, LCSW Clinical Social Worker [place] [facility] [place]   [phone] 2:17 PM

## 2019-01-11 NOTE — Telephone Encounter (Signed)
[facility] received and will process now . Ebony Pierce, CMA  

## 2019-05-02 ENCOUNTER — Telehealth: Payer: Self-pay | Admitting: Family Medicine

## 2019-05-02 NOTE — Telephone Encounter (Signed)
Daughter called to speak with doctor to questions if it is ok for her mother to get the COVID shot.  Daughter wants you to call her at [phone]. Mother's appointment is Friday. vb

## 2019-05-03 NOTE — Telephone Encounter (Signed)
LVM to have a return call to our office. If pt calls, please give her the information below or have her speak with Melvenia Beam or April. Sunday Spillers, CMA  Marthenia Rolling, DO  [facility] 16 hours ago (6:49 PM)   Please call patient, " Dr. Parke Simmers is working overnights and he apologizes that he was unable to call you himself. He said that he is reviewed your mom's chart and that he does not see any reason for her to not get the coronavirus vaccine. He does want to mention that some people have some muscle aches and flulike symptoms right after getting it but that it is generally well-tolerated. If you have a more specific question we can schedule you to speak with one of the physicians for the day on a telemedicine visit."   Message text

## 2019-05-03 NOTE — Telephone Encounter (Signed)
LVM to call office to inform pt/daughter of below.  If she calls back please inform her and if she does want to schedule an appointment please assist in getting this scheduled.Florrie Ramires Zimmerman Rumple, CMA               Bandon, LeChee, DO  P [facility]  Caller: Unspecified (Yesterday, 9:47 AM)  Please call patient, " Dr. Parke Simmers is working overnights and he apologizes that he was unable to call you himself. He said that he is reviewed your mom's chart and that he does not see any reason for her to not get the coronavirus vaccine. He does want to mention that some people have some muscle aches and flulike symptoms right after getting it but that it is generally well-tolerated. If you have a more specific question we can schedule you to speak with one of the physicians for the day on a telemedicine visit."

## 2019-05-04 NOTE — Telephone Encounter (Signed)
2nd attempt to reach pt/daughter to inform her of below.  VM was left. Lamberto Dinapoli Zimmerman Rumple, CMA

## 2019-05-04 NOTE — Telephone Encounter (Signed)
Pt returned call from nurse line and informed of below.   Veronda Prude, RN

## 2019-09-12 ENCOUNTER — Ambulatory Visit (INDEPENDENT_AMBULATORY_CARE_PROVIDER_SITE_OTHER): Payer: Medicare Other | Admitting: Family Medicine

## 2019-09-12 ENCOUNTER — Other Ambulatory Visit: Payer: Self-pay

## 2019-09-12 ENCOUNTER — Encounter: Payer: Self-pay | Admitting: Family Medicine

## 2019-09-12 VITALS — BP 112/60 | HR 82 | Ht 60.0 in | Wt 152.2 lb

## 2019-09-12 DIAGNOSIS — H547 Unspecified visual loss: Secondary | ICD-10-CM

## 2019-09-12 DIAGNOSIS — H9193 Unspecified hearing loss, bilateral: Secondary | ICD-10-CM

## 2019-09-12 DIAGNOSIS — F0390 Unspecified dementia without behavioral disturbance: Secondary | ICD-10-CM | POA: Diagnosis not present

## 2019-09-12 DIAGNOSIS — R5383 Other fatigue: Secondary | ICD-10-CM

## 2019-09-12 DIAGNOSIS — D539 Nutritional anemia, unspecified: Secondary | ICD-10-CM

## 2019-09-12 NOTE — Patient Instructions (Signed)
Today we tested your hearing and vision, your vision in the left eye was a little bit decreased but not too bad.  We are still going to send you to the eye doctor because I really want to make sure that your eye health is as good as possible given that you are having trouble seeing out of the right eye at all.  Were going to send you to a person called an audiologist to check you out for a hearing aid.  I will let you know how the labs come back and see if they give [place] any indication of why you might be getting tired when you walk around.  But I do want to say that walking around for 20 minutes is still actually pretty impressive and I like to encourage that you stay active as possible  Dr. Parke Simmers

## 2019-09-13 ENCOUNTER — Telehealth: Payer: Self-pay | Admitting: *Deleted

## 2019-09-13 ENCOUNTER — Ambulatory Visit (INDEPENDENT_AMBULATORY_CARE_PROVIDER_SITE_OTHER): Payer: Medicare Other

## 2019-09-13 DIAGNOSIS — R5383 Other fatigue: Secondary | ICD-10-CM | POA: Insufficient documentation

## 2019-09-13 DIAGNOSIS — D539 Nutritional anemia, unspecified: Secondary | ICD-10-CM | POA: Diagnosis not present

## 2019-09-13 DIAGNOSIS — H9193 Unspecified hearing loss, bilateral: Secondary | ICD-10-CM | POA: Insufficient documentation

## 2019-09-13 DIAGNOSIS — E538 Deficiency of other specified B group vitamins: Secondary | ICD-10-CM

## 2019-09-13 LAB — BASIC METABOLIC PANEL
BUN/Creatinine Ratio: 10 — ABNORMAL LOW (ref 12–28)
BUN: 9 mg/dL (ref 8–27)
CO2: 23 mmol/L (ref 20–29)
Calcium: 9.1 mg/dL (ref 8.7–10.3)
Chloride: 104 mmol/L (ref 96–106)
Creatinine, Ser: 0.93 mg/dL (ref 0.57–1.00)
GFR calc Af Amer: 66 mL/min/{1.73_m2} (ref >59)
GFR calc non Af Amer: 57 mL/min/{1.73_m2} — ABNORMAL LOW (ref >59)
Glucose: 101 mg/dL — ABNORMAL HIGH (ref 65–99)
Potassium: 3.6 mmol/L (ref 3.5–5.2)
Sodium: 140 mmol/L (ref 134–144)

## 2019-09-13 LAB — CBC
Hematocrit: 20.9 % — ABNORMAL LOW (ref 34.0–46.6)
Hemoglobin: 7 g/dL — CL (ref 11.1–15.9)
MCH: 35 pg — ABNORMAL HIGH (ref 26.6–33.0)
MCHC: 33.5 g/dL (ref 31.5–35.7)
MCV: 105 fL — ABNORMAL HIGH (ref 79–97)
NRBC: 1 % — ABNORMAL HIGH (ref 0–0)
Platelets: 186 10*3/uL (ref 150–450)
RBC: 2 x10E6/uL — CL (ref 3.77–5.28)
WBC: 3.3 10*3/uL — ABNORMAL LOW (ref 3.4–10.8)

## 2019-09-13 LAB — VITAMIN B12: Vitamin B-12: <50 pg/mL — ABNORMAL LOW (ref 232–1245)

## 2019-09-13 MED ORDER — CYANOCOBALAMIN 1000 MCG/ML IJ SOLN
1000.0000 ug | INTRAMUSCULAR | Status: DC | PRN
  Administered 2019-09-13 – 2020-09-17 (×7): 1000 ug via INTRAMUSCULAR

## 2019-09-13 NOTE — Progress Notes (Signed)
SUBJECTIVE:   CHIEF COMPLAINT / HPI: Fatigue  Fatigue No shortness of breath, angina, claudication.  Patient has started to get fatigue 20 to 30 minutes into walking around the store as opposed to her prior baseline of hours.  This is been a slow decline over months.  No injuries, sick symptoms, signs of bleeding  Vision decreased Long-term right eye blindness, daughter says that her mom had a surgery in [place] for which she thinks was a cataract.  Mom has been complaining that she has to hold items closer to her to be able to see with her left eye.  Bilateral hearing loss Patient with increasing need to have people speak louder and louder volumes to her.  She did fail her hearing screening and so we will refer to audiology for evaluation for potential of hearing aid.  This has been a slow decline with no rapid increase or recent injury/illness  PERTINENT  PMH / PSH: High methylmalonic acid, low/normal B12  OBJECTIVE:   BP 112/60    Pulse 82    Ht 5' (1.524 m)    Wt 152 lb 3.2 oz (69 kg)    SpO2 98%    BMI 29.72 kg/m   General: Alert, hard of hearing, pleasant, no distress Cardiac: Regular rate and rhythm, no pitting edema Pulmonary: No increased work of breathing, no cough, no wheeze, good air movement throughout Gait: Patient was able to ambulate with a stable gait without assistance, no claudication or chest pain/shortness of breath described in a walk around her clinic. 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  EOMI intact bilaterally, sclera clear, visual acuity 20/50 left eye, right eye with no vision  ASSESSMENT/PLAN:   Macrocytic anemia Anemia to 7 with fatigue complaints, patient now only able to walk 20 to 30 minutes before she gets tired as opposed hours which has been her long-term baseline.  This is been a slow decline over the last few months.  No known bleeding.  Does have a history of high methylmalonic acid and low normal B12.     B12 still pending, have ordered B12 shots to get started.  Discussed with daughter this plan and that if the B12 comes back normal we will need to continue further diagnostic work-up.  Will do 1 shot per week for 4 weeks and then go to monthly assuming B12 still stays low.  We will get follow-up labs in mid July along with follow-up appointment with Dr. Foster Simpson will be assuming her care.  Discussed with daughter how important it is for this care plan not to be interrupted because we are still unsure if it will be B12 although it seems most likely issue.  Fatigue No shortness of breath, angina, claudication.  Patient has started to get fatigue 20 to 30 minutes into walking around the store as opposed to her prior baseline of hours.  This is been a slow decline over months.  No injuries, sick symptoms, signs of bleeding  When further described in problem list for macrocytic anemia problem  Vision decreased Long-term right eye blindness, daughter says that her mom had a surgery in [place] for which she thinks was a cataract.  Mom has been complaining that she has to hold items closer to her to be able to see with her left eye.  Visual acuity on the left 20/50, will refer to ophthalmology.  No other left eye abnormality identified in my exam  Bilateral hearing loss Patient  with increasing need to have people speak louder and louder volumes to her.  She did fail her hearing screening and so we will refer to audiology for evaluation for potential of hearing aid.  This has been a slow decline with no rapid increase or recent injury/illness     Sherene Sires, [place]

## 2019-09-13 NOTE — Assessment & Plan Note (Signed)
No shortness of breath, angina, claudication.  Patient has started to get fatigue 20 to 30 minutes into walking around the store as opposed to her prior baseline of hours.  This is been a slow decline over months.  No injuries, sick symptoms, signs of bleeding  When further described in problem list for macrocytic anemia problem

## 2019-09-13 NOTE — Progress Notes (Signed)
Patient presents in nurse clinic for B12 injection (1 of 4.)   Injection given RD, site unremarkable.   Patient scheduled for #2 on 7/1 @3 :30.

## 2019-09-13 NOTE — Assessment & Plan Note (Signed)
Long-term right eye blindness, daughter says that her mom had a surgery in [place] for which she thinks was a cataract.  Mom has been complaining that she has to hold items closer to her to be able to see with her left eye.  Visual acuity on the left 20/50, will refer to ophthalmology.  No other left eye abnormality identified in my exam

## 2019-09-13 NOTE — Assessment & Plan Note (Signed)
Patient with increasing need to have people speak louder and louder volumes to her.  She did fail her hearing screening and so we will refer to audiology for evaluation for potential of hearing aid.  This has been a slow decline with no rapid increase or recent injury/illness

## 2019-09-13 NOTE — Telephone Encounter (Signed)
-----   Message from Marthenia Rolling, DO sent at 09/13/2019  9:38 AM EDT ----- Dr. Nobie Putnam, this patient will need you to complete her f/u for anemia (looking like b12 so far, hope to confirm prior to leaving).  [facility], please schedule with Dr. Foster Simpson mid july

## 2019-09-13 NOTE — Assessment & Plan Note (Signed)
Anemia to 7 with fatigue complaints, patient now only able to walk 20 to 30 minutes before she gets tired as opposed hours which has been her long-term baseline.  This is been a slow decline over the last few months.  No known bleeding.  Does have a history of high methylmalonic acid and low normal B12.    B12 still pending, have ordered B12 shots to get started.  Discussed with daughter this plan and that if the B12 comes back normal we will need to continue further diagnostic work-up.  Will do 1 shot per week for 4 weeks and then go to monthly assuming B12 still stays low.  We will get follow-up labs in mid July along with follow-up appointment with Dr. Foster Simpson will be assuming her care.  Discussed with daughter how important it is for this care plan not to be interrupted because we are still unsure if it will be B12 although it seems most likely issue.

## 2019-09-14 ENCOUNTER — Ambulatory Visit: Payer: Medicare Other

## 2019-09-14 NOTE — Telephone Encounter (Signed)
Contacted pt and appointment scheduled for 10/13/2019. Ebony Pierce, CMA

## 2019-09-21 ENCOUNTER — Other Ambulatory Visit: Payer: Self-pay

## 2019-09-21 ENCOUNTER — Ambulatory Visit (INDEPENDENT_AMBULATORY_CARE_PROVIDER_SITE_OTHER): Payer: Medicare Other

## 2019-09-21 DIAGNOSIS — D539 Nutritional anemia, unspecified: Secondary | ICD-10-CM

## 2019-09-21 DIAGNOSIS — E538 Deficiency of other specified B group vitamins: Secondary | ICD-10-CM

## 2019-09-21 NOTE — Progress Notes (Signed)
Patient presents in nurse clinic for B12 injection (2 of 4.)   Injection given LD, site unremarkable.   Patient scheduled for #3 on 7/8 @3 :30.

## 2019-09-27 ENCOUNTER — Ambulatory Visit: Payer: Medicare Other | Admitting: Audiologist

## 2019-09-28 ENCOUNTER — Ambulatory Visit (INDEPENDENT_AMBULATORY_CARE_PROVIDER_SITE_OTHER): Payer: Medicare Other

## 2019-09-28 ENCOUNTER — Other Ambulatory Visit: Payer: Self-pay

## 2019-09-28 DIAGNOSIS — E538 Deficiency of other specified B group vitamins: Secondary | ICD-10-CM

## 2019-09-28 DIAGNOSIS — D539 Nutritional anemia, unspecified: Secondary | ICD-10-CM | POA: Diagnosis not present

## 2019-09-29 ENCOUNTER — Ambulatory Visit: Payer: Medicare Other | Admitting: Audiologist

## 2019-09-29 MED ORDER — CYANOCOBALAMIN 1000 MCG/ML IJ SOLN: 1000.0000 ug | Freq: Once | INTRAMUSCULAR | Status: DC

## 2019-09-29 NOTE — Progress Notes (Signed)
Pt is here for a b12 injection today.    Date of last office visit that b12 was discussed 09/12/2019  Last injection was 09/21/2019  Injection given in right deltoid, pt tolerated well and scheduled nurse visit for Thursday, 7/15 at 1530.  *Delay in charting, nurse visit on 07/08 at 1530.   Veronda Prude, RN

## 2019-10-02 ENCOUNTER — Ambulatory Visit: Payer: Medicare Other | Admitting: Audiologist

## 2019-10-05 ENCOUNTER — Other Ambulatory Visit: Payer: Self-pay

## 2019-10-05 ENCOUNTER — Ambulatory Visit (INDEPENDENT_AMBULATORY_CARE_PROVIDER_SITE_OTHER): Payer: Medicare Other

## 2019-10-05 DIAGNOSIS — D539 Nutritional anemia, unspecified: Secondary | ICD-10-CM

## 2019-10-05 DIAGNOSIS — E538 Deficiency of other specified B group vitamins: Secondary | ICD-10-CM | POA: Diagnosis not present

## 2019-10-05 NOTE — Progress Notes (Signed)
Pt is here for a b12 injection today.    Date of last office visit that b12 was discussed 09/12/2019  Last injection was 09/28/2019  Injection given in left deltoid, pt tolerated well and is scheduled for follow up office visit next Friday 7/23.   Veronda Prude, RN

## 2019-10-13 ENCOUNTER — Ambulatory Visit (INDEPENDENT_AMBULATORY_CARE_PROVIDER_SITE_OTHER): Payer: Medicare Other | Admitting: Family Medicine

## 2019-10-13 ENCOUNTER — Other Ambulatory Visit: Payer: Self-pay

## 2019-10-13 ENCOUNTER — Encounter: Payer: Self-pay | Admitting: Family Medicine

## 2019-10-13 VITALS — BP 128/68 | HR 71 | Wt 156.0 lb

## 2019-10-13 DIAGNOSIS — E538 Deficiency of other specified B group vitamins: Secondary | ICD-10-CM | POA: Diagnosis not present

## 2019-10-13 DIAGNOSIS — D519 Vitamin B12 deficiency anemia, unspecified: Secondary | ICD-10-CM

## 2019-10-13 NOTE — Assessment & Plan Note (Signed)
Patient presents with her daughter.  Reports that since receiving her B12 infusions once a week over the last 4 weeks she feels like her energy has gotten much better.  She is not near as fatigued.  She is able to ambulate without difficulty.  Her strength has also improved.  Denies any bleeding.  Has no other concerns at this time.  B12 levels at last check were less than 50.  Supplementation was discussed at previous visits and she has completed 4 weeks of infusions.  We will check B12 levels today and if they are within normal limits continue monthly B12 infusions. -Vitamin B12 levels collected -CBC ordered to assess anemia status -Strict return precautions given

## 2019-10-13 NOTE — Progress Notes (Signed)
    SUBJECTIVE:   CHIEF COMPLAINT / HPI:   B12 deficiency Patient with known B12 deficiency.  Was seen on 6/22 and started on weekly injections of B12.  Patient reports that since that time she has felt that her fatigue has greatly improved.  Her weakness is also greatly improved.  Denies any other issues or concerns at this time.  Says that she was just here to get her B12 level checked.  Other preventative measures were discussed at this time such as Tdap and COVID-19 vaccine as well as referral for audiology.  Patient reports that she is not going to see the audiologist and she does not want her Tdap vaccine right now.  Patient has had her Covid vaccine since February but does not have the card with her.  OBJECTIVE:   BP 128/68   Pulse 71   Wt 156 lb (70.8 kg)   SpO2 98%   BMI 30.47 kg/m   General: Well-appearing, no acute distress, 84 year old female Cardiac: Regular rate and rhythm, no murmur noted Respiratory: Normal work of breathing, lungs clear to auscultation bilaterally Abdomen: Soft, nontender, positive bowel sounds MSK: Patient has good upper extremity strength with no drift noted.  Patient is able to ambulate without difficulty. ASSESSMENT/PLAN:   B12 deficiency anemia Patient presents with her daughter.  Reports that since receiving her B12 infusions once a week over the last 4 weeks she feels like her energy has gotten much better.  She is not near as fatigued.  She is able to ambulate without difficulty.  Her strength has also improved.  Denies any bleeding.  Has no other concerns at this time.  B12 levels at last check were less than 50.  Supplementation was discussed at previous visits and she has completed 4 weeks of infusions.  We will check B12 levels today and if they are within normal limits continue monthly B12 infusions. -Vitamin B12 levels collected -CBC ordered to assess anemia status -Strict return precautions given     Derrel Nip, MD [facility]  [facility]  \

## 2019-10-13 NOTE — Patient Instructions (Addendum)
It was wonderful to meet you today!  I am glad that you are feeling better after your B12 injections.  We will check your B12 levels today and I will call you with those results.  Once we have the results we will determine if you need more supplementation right now or if we should continue with the monthly injections.  If you have any questions or concerns please feel free to call our clinic.  I hope you have a wonderful afternoon!  ????????????????????????????! ??????????????????????????????????? B12 ?????? ????????????????? B12 ??????????????????????????????????????? ??????????????????? ??????????????????????????????????????????????????????????? ?????????????????????????? ??????????????????????????? ? ????????????????????????? ?????????????????????????????????????! W?n n?? m?n wi??es?? m?k th?? d?? phb khu?! C?h?n d?c? th?? khu? r??s??k d? k?h??n h?l?ngc?k c?h?d B12 k?hxng khu? re? ca trwc s?xb rad?b B12 k?hxng khu? w?n n?? la c?h?n ca thor h?? khu? phe???x c?ng p?hl me???x d?? p?hll?ph?h? l?w re? ca phic?r?? ?? khu? t?xngk?r x?h??r s?erim phemteim n? txn n?? h?r??x m?? h?r??x re? khwr c?h?d t?x p? thuk de??xn h??k khu? m? kh?t?h?m h?r??x k??x k?ngwl d? pord thor tidt?x khlinik k?hxng re? c?h?n h?w?ng ?? khu? ca m? ch?wng b??y th?? yxd ye??ym!

## 2019-10-14 LAB — CBC
Hematocrit: 35.3 % (ref 34.0–46.6)
Hemoglobin: 11.5 g/dL (ref 11.1–15.9)
MCH: 31.8 pg (ref 26.6–33.0)
MCHC: 32.6 g/dL (ref 31.5–35.7)
MCV: 98 fL — ABNORMAL HIGH (ref 79–97)
Platelets: 386 10*3/uL (ref 150–450)
RBC: 3.62 x10E6/uL — ABNORMAL LOW (ref 3.77–5.28)
RDW: 15 % (ref 11.7–15.4)
WBC: 6.6 10*3/uL (ref 3.4–10.8)

## 2019-10-14 LAB — VITAMIN B12: Vitamin B-12: 833 pg/mL (ref 232–1245)

## 2019-12-15 ENCOUNTER — Telehealth: Payer: Self-pay

## 2019-12-15 NOTE — Telephone Encounter (Signed)
Patient's daughter calls nurse line with questions regarding medication management. Daughter wants to make sure it is okay for mother to continue daily multi vitamin with melatonin supplement. Advised daughter that there were no interactions between multi vitamin and melatonin and that provider Parke Simmers) recommended that patient could take 5 mg tablet melatonin nightly.   Daughter also has questions regarding B12 injections. Per last labs, B12 levels were normal. Please advise if patient is to continue monthly injections   To PCP  Veronda Prude, RN

## 2019-12-15 NOTE — Telephone Encounter (Signed)
The patient's B12 levels are within normal limits due to her monthly B12 injections.  She will need to continue these injections monthly.  I called the patient's daughter and discussed this with her and she says that she will call the clinic to schedule this appointment.

## 2020-01-31 ENCOUNTER — Ambulatory Visit (INDEPENDENT_AMBULATORY_CARE_PROVIDER_SITE_OTHER): Payer: Medicare Other

## 2020-01-31 ENCOUNTER — Telehealth: Payer: Self-pay

## 2020-01-31 ENCOUNTER — Other Ambulatory Visit: Payer: Self-pay

## 2020-01-31 DIAGNOSIS — D539 Nutritional anemia, unspecified: Secondary | ICD-10-CM | POA: Diagnosis not present

## 2020-01-31 DIAGNOSIS — D519 Vitamin B12 deficiency anemia, unspecified: Secondary | ICD-10-CM

## 2020-01-31 NOTE — Progress Notes (Signed)
Patient presents in nurse clinic for monthly b12 injections.   Injection administered RD without complication.  Will due again in 1 month. Advised daughter to come in before they go to [place].

## 2020-01-31 NOTE — Telephone Encounter (Signed)
FMLA and Disability Parking Placard form dropped off for at front desk for completion.  Verified that patient section of form has been completed.  Last DOS/WCC with PCP was 01/31/20. Placed form in team folder to be completed by clinical staff.  [employer]

## 2020-01-31 NOTE — Telephone Encounter (Signed)
Clinical info completed on [employer] and FMLA forms.  Placed form's in Dr. Geanie Logan box for completion.  Sunday Spillers, CMA

## 2020-02-05 NOTE — Telephone Encounter (Signed)
Called patient regarding form completion.  She reports that her mother does not have any difficulty ambulating and does not use a walker or cane.  She reports she is not sure if the patient would qualify for a disability placard but she thought she would try.  From the conversation that we had it sounds like she may not need the disability placard so I will not be filling that form out at this time.  Regarding the FMLA paperwork she left paperwork to fill out for the patient rather than for a caregiver.  She also did not bring me the form that I need to sign because she did not realize that the signature page was on the last page.  I have an appointment scheduled with this patient next Monday 11/22 and her daughter will bring the paperwork to that appointment to be filled out.

## 2020-02-12 ENCOUNTER — Encounter: Payer: Self-pay | Admitting: Family Medicine

## 2020-02-12 ENCOUNTER — Other Ambulatory Visit: Payer: Self-pay

## 2020-02-12 ENCOUNTER — Ambulatory Visit (INDEPENDENT_AMBULATORY_CARE_PROVIDER_SITE_OTHER): Payer: Medicare Other | Admitting: Family Medicine

## 2020-02-12 VITALS — BP 132/70 | HR 67 | Ht 60.0 in | Wt 156.0 lb

## 2020-02-12 DIAGNOSIS — Z638 Other specified problems related to primary support group: Secondary | ICD-10-CM

## 2020-02-12 DIAGNOSIS — Z23 Encounter for immunization: Secondary | ICD-10-CM | POA: Diagnosis not present

## 2020-02-12 NOTE — Patient Instructions (Signed)
It was wonderful to see you today!  I did not hear any wheeze in your mother's physical exam and she does not seem short of breath.  If you notice shortness of breath or worsening wheezing please seek medical attention but otherwise there is nothing to do at this time.  Regarding the FMLA paperwork I have filled it out to the best of my ability.  Please let me know if there is anything that needs to be changed.  I hope you have a wonderful afternoon and a safe trip if I do not see you before then!

## 2020-02-12 NOTE — Progress Notes (Signed)
    SUBJECTIVE:   CHIEF COMPLAINT / HPI:   Concerns for wheezing Patient's daughter reports that her husband was concerned that he heard the patient wheezing while she was walking around.  The patient's daughter reports she has not heard any of the wheezing and the patient does not complain of any shortness of breath or difficulty breathing.  FMLA paperwork Patient's daughter needs caregiver FMLA paperwork filled out.  The patient is going to [place] December 20 for 6 weeks.  Her daughter is her primary caregiver who assist her with ADLs such as bathing, going to the restroom, helping her get dressed.  She would be unable to travel to [place] without her daughter's assistance.  OBJECTIVE:   BP 132/70   Pulse 67   Ht 5' (1.524 m)   Wt 156 lb (70.8 kg)   SpO2 98%   BMI 30.47 kg/m   General: Well-appearing 84 year old female in no acute distress Respiratory: No wheezes noted, normal work of breathing, lungs clear to auscultation bilaterally, moist mucous membranes with no rhinorrhea or upper airway congestion noted Cardiac: Regular rate and rhythm Abdomen: Soft, nontender, positive bowel sounds MSK: Patient is able to ambulate without difficulty.  She does not become short of breath with ambulation.  ASSESSMENT/PLAN:   Caregiver role strain Patient's daughter is requesting FMLA paperwork so that she can assist her mother with travel to [place].  FMLA paperwork completed today at this visit.   Concern for wheezing Patient has no shortness of breath or signs of wheezing on exam today.  She ambulates without any increased work of breathing.  Lung exam is reassuring. -Recommend follow-up as needed -No need for treatment at this time given she has no respiratory symptoms  Derrel Nip, MD [facility]

## 2020-02-13 NOTE — Assessment & Plan Note (Signed)
Patient's daughter is requesting FMLA paperwork so that she can assist her mother with travel to [place].  FMLA paperwork completed today at this visit.

## 2020-02-20 ENCOUNTER — Encounter: Payer: Self-pay | Admitting: Family Medicine

## 2020-03-01 ENCOUNTER — Ambulatory Visit (INDEPENDENT_AMBULATORY_CARE_PROVIDER_SITE_OTHER): Payer: Medicare Other

## 2020-03-01 ENCOUNTER — Other Ambulatory Visit: Payer: Self-pay

## 2020-03-01 DIAGNOSIS — D539 Nutritional anemia, unspecified: Secondary | ICD-10-CM

## 2020-03-01 DIAGNOSIS — D519 Vitamin B12 deficiency anemia, unspecified: Secondary | ICD-10-CM

## 2020-03-01 MED ORDER — CYANOCOBALAMIN 1000 MCG/ML IJ SOLN: 1000.0000 ug | Freq: Once | INTRAMUSCULAR | 0 refills | Status: AC

## 2020-03-01 NOTE — Progress Notes (Signed)
Patient present in nurse clinic for monthly B12 injection.   Injection administered LD without complication.   Patient leaving for [place] on 12/20 and will return in February.   Advised to call when they get back for apt.

## 2020-03-08 DIAGNOSIS — Z20822 Contact with and (suspected) exposure to covid-19: Secondary | ICD-10-CM | POA: Diagnosis not present

## 2020-03-10 DIAGNOSIS — Z20822 Contact with and (suspected) exposure to covid-19: Secondary | ICD-10-CM | POA: Diagnosis not present

## 2020-05-13 ENCOUNTER — Other Ambulatory Visit: Payer: Self-pay

## 2020-05-13 ENCOUNTER — Ambulatory Visit (INDEPENDENT_AMBULATORY_CARE_PROVIDER_SITE_OTHER): Payer: Medicare Other | Admitting: Family Medicine

## 2020-05-13 ENCOUNTER — Encounter: Payer: Self-pay | Admitting: Family Medicine

## 2020-05-13 VITALS — BP 139/81 | HR 76 | Ht 60.0 in | Wt 150.8 lb

## 2020-05-13 DIAGNOSIS — Z23 Encounter for immunization: Secondary | ICD-10-CM | POA: Diagnosis not present

## 2020-05-13 DIAGNOSIS — F0391 Unspecified dementia with behavioral disturbance: Secondary | ICD-10-CM

## 2020-05-13 MED ORDER — DONEPEZIL HCL 10 MG PO TABS: 10.0000 mg | ORAL_TABLET | Freq: Every day | ORAL | 3 refills | Status: DC

## 2020-05-13 NOTE — Patient Instructions (Signed)
It was a pleasure seeing you today and I am glad that you had a great trip to [place].  I want her mother to start taking her donepezil.  I have placed a referral for her to be seen in our geriatrics clinic.  I also want her to take her melatonin nightly but if this does not help her we can consider other medications.  I do not want her taking any more of the medication she was prescribed from [place].  I hope you have a wonderful afternoon!

## 2020-05-13 NOTE — Progress Notes (Signed)
    SUBJECTIVE:   CHIEF COMPLAINT / HPI:   Worsening dementia along with medication concerns Patient's daughter reports that they recently returned from [place].  She says that on the way to [place] and as well as on the way back she kept getting out of her seat confused about where she was thinking she was at home.  She would be be sleeping just prior to these episodes of confusion.  They went to the doctor while in [place] and the doctor prescribed her lorazepam, Seroquel, Haldol, and sertraline.  She reports that her mother got extremely lethargic after taking these medications and that she took them for short period but has not been taking them recently out of concern.  She also reports that she has not been taking the donepezil out of concerns for side effects from that.  She was okay with the 5 mg dose but is concerned about the 10 mg dose.  The patient's daughter reports that she still helps the patient with her ADLs and that she is doing fine from that standpoint.  She will occasionally become disoriented but is easily reoriented to where she is.  She never gets anxious or violent.  OBJECTIVE:   BP 139/81   Pulse 76   Ht 5' (1.524 m)   Wt 150 lb 12.8 oz (68.4 kg)   SpO2 96%   BMI 29.45 kg/m   General: Pleasant appearing 85 year old female, no acute distress Cardiac: Regular rate and rhythm Respiratory: Normal work of breathing Psych: Patient is oriented to place and person but not time.  The language barrier makes it difficult but with translator she reports that she is in [place] and at a doctor's office.  ASSESSMENT/PLAN:   Dementia with behavioral disturbance Patient's daughter reports that she is getting more confused more often.  She was lethargic on the medications that were prescribed in [place].  Recommended that the patient discontinue these medications.  She is going to initiate the donepezil 10 mg daily.  I have also placed a referral for geriatrics clinic at the [facility]  which the patient's daughter is open to.  The patient's daughter is also concerned because the patient will sometimes sleep through the night but also sometimes has difficulty falling asleep.  Recommended she try melatonin.  Could consider some type of medication for sleep in the future.     Derrel Nip, MD [facility]

## 2020-05-13 NOTE — Assessment & Plan Note (Addendum)
Patient's daughter reports that she is getting more confused more often.  She was lethargic on the medications that were prescribed in [place].  Recommended that the patient discontinue these medications.  She is going to initiate the donepezil 10 mg daily.  I have also placed a referral for geriatrics clinic at the [facility] which the patient's daughter is open to.  The patient's daughter is also concerned because the patient will sometimes sleep through the night but also sometimes has difficulty falling asleep.  Recommended she try melatonin.  Could consider some type of medication for sleep in the future.

## 2020-05-17 ENCOUNTER — Telehealth: Payer: Self-pay

## 2020-05-17 DIAGNOSIS — F0391 Unspecified dementia with behavioral disturbance: Secondary | ICD-10-CM

## 2020-05-17 NOTE — Telephone Encounter (Signed)
Patient's daughter calls nurse line regarding medication side effects of Donepezil 10 mg. Reports that after increasing the dose from 5 mg to 10 mg that mother is having fatigue and dizziness. States that she held the dose last night and only gave melatonin and symptoms went away. Patient is currently asymptomatic.   Daughter would like to decrease rx back to 5 mg. Also, daughter wants to make sure that mother can take donepezil with melatonin.   Please advise.   Veronda Prude, RN

## 2020-05-17 NOTE — Telephone Encounter (Signed)
Called patients daughter regarding medication concerns.  She reports that she was feeling lightheaded and dizzy before she took the medication and that she has had this occur on several occasions.  It typically occurs after she does not eat her dinner.  She reports that she will try the medication again tonight and if it is making her dizzy she can break the pills in half and she can take 5 mg rather than 10.  She will let me know the outcome of this.  I also discussed with her that it is safe to take donepezil and melatonin together.  She had no further questions or concerns.

## 2020-05-20 NOTE — Telephone Encounter (Signed)
Patient's daughter calls nurse line regarding concerns for patient having headache over the last two days. Daughter believes this is related to the 10 mg Aricept. Daughter is going to trial discontinuing Aricept, as she believes that the two are related.   Daughter will call back to update provider as needed. Return precautions given.   Veronda Prude, RN

## 2020-05-22 ENCOUNTER — Other Ambulatory Visit: Payer: Self-pay | Admitting: *Deleted

## 2020-05-22 DIAGNOSIS — Z0189 Encounter for other specified special examinations: Secondary | ICD-10-CM

## 2020-05-22 NOTE — Progress Notes (Signed)
Referral placed for assessment of needs prior to geri appt in April.  Daughter [name] spoke with LCSW in 2019 regarding CAP forms.  Riyanna Crutchley,CMA

## 2020-05-28 ENCOUNTER — Telehealth: Payer: Self-pay | Admitting: *Deleted

## 2020-05-28 NOTE — Chronic Care Management (AMB) (Signed)
  Care Management   Note  05/28/2020 Name: Johnae Friley MRN: [id number] DOB: November 06, 1935  Kikue Gerhart is a 85 y.o. year old female who is a primary care patient of Derrel Nip, MD. I reached out to Raoul Pitch by phone today in response to a referral sent by Ms. Anjolie Power's health plan.    Ms. Highley was given information about care management services today including:  1. Care management services include personalized support from designated clinical staff supervised by her physician, including individualized plan of care and coordination with other care providers 2. 24/7 contact phone numbers for assistance for urgent and routine care needs. 3. The patient may stop care management services at any time by phone call to the office staff.  Patient daughter Remi Deter  agreed to services and verbal consent obtained.   Follow up plan: Telephone appointment with care management team member scheduled for:07/04/2020  [facility] Guide, Embedded Care Coordination [facility] Management

## 2020-06-03 ENCOUNTER — Telehealth: Payer: Self-pay

## 2020-06-03 NOTE — Telephone Encounter (Signed)
-----   Message from Henri Medal, [place] sent at 05/15/2020  9:34 AM EST ----- Regarding: RE: geri clinic I have scheduled her for 4-21 at 2pm.  Can you please let her daughter BO know about it?  Thanks    ----- Message ----- From: McDiarmid, Leighton Roach, MD Sent: 05/14/2020  12:14 PM EST To: Henri Medal, CMA, Derrel Nip, MD Subject: FW: geri clinic                                Let's schedule the patient for evaluation of possible behavioral symptoms of dementia. Thank you ----- Message ----- From: Henri Medal, CMA Sent: 05/14/2020  10:11 AM EST To: Leighton Roach McDiarmid, MD Subject: geri clinic                                    Hi,   Can you review this patient from Dr. Nobie Putnam for dementia?  Let me know if they need to be scheduled and I will work on getting that done.    Thanks [employer]

## 2020-06-03 NOTE — Telephone Encounter (Signed)
Spoke with pts daughter [name]. Informed of Geri appt on 4/21 at 2:00. I also informed that a packet will be sent to her address with her name on it. To please fill out and bring to appt along with all medication currently take and ones not taking. Aquilla Solian, CMA

## 2020-07-04 ENCOUNTER — Ambulatory Visit: Payer: Medicare Other | Admitting: Licensed Clinical Social Worker

## 2020-07-04 ENCOUNTER — Encounter: Payer: Self-pay | Admitting: Licensed Clinical Social Worker

## 2020-07-04 DIAGNOSIS — Z7189 Other specified counseling: Secondary | ICD-10-CM

## 2020-07-04 DIAGNOSIS — Z719 Counseling, unspecified: Secondary | ICD-10-CM

## 2020-07-04 DIAGNOSIS — Z139 Encounter for screening, unspecified: Secondary | ICD-10-CM

## 2020-07-04 NOTE — Chronic Care Management (AMB) (Signed)
Care Management Clinical Social Work  [facility] Psychosocial     07/04/2020 Name: Ebony Pierce MRN: [id number] DOB: [birth year]  Ebony Pierce is a 85 y.o. year old female who sees Derrel Nip, MD for primary care.    Engaged with patient's daughter by telephone for initial visit in response to a referral from Dr. McDiarmid for social work care coordination services to assess needs, support and barriers to care prior to [facility] clinic appointment  Ms.  Glasby 's daughter was given information about Care Management services today including:  1. Care Management services includes personalized support from designated clinical staff supervised by her physician, including individualized plan of care and coordination with other care providers 2. 24/7 contact phone numbers for assistance for urgent and routine care needs.  3. The patient may stop case management services at any time by phone call to the office staff. Patient's daughter agreed to services and consent obtained. Marland Kitchen   SDOH (Social Determinants of Health) assessments performed: Yes: No needs identified  Advanced Directives Status: See Care Plan for related entries.    (Look under patient's history for social information from assessment). Patient  was accompanied by her daughter during the phone visit. Daughte provided all information Assessment: Daughter's main concern is patient having difficulty sleeping,  She is taking 5mg  of melatonin but doesn't seem to be working.  Daughter also expressed concerns with Donepezil 10mg  stats she has discontinued this medication due to patient complaining of headache. Daughter was instructed to bring all medication to [facility] clinic appointment.  Plan: LCSW will share information with Geri clinic providers and f/u with patient's daughter in 2 weeks.  Review of patient past medical history, allergies, medications, and health status, including review of relevant consultants reports was  performed today as part of a comprehensive evaluation and provision of chronic care management and care coordination services.   Conditions to be addressed/monitored: Dementia; Advance Directive Patient Care Plan: General Social Work (Adult)  Problem Identified: No Advance Directive   Goal: Effective Long-Term Care Planning   Start Date: 07/04/2020  This Visit's Progress: On track  Priority: Medium  Current barriers:   . Patient does not have an advance directive.  . Needs education, support and coordination in order to meet this need. Clinical Goal(s): Over the next 30 days, the patient and daughter will review e-mailed EMMI education on advance directive, complete advance directive packet and have notarized.  Interventions: . Collaboration with PCP regarding development and update of comprehensive plan of care as evidenced by provider attestation and co-signature. [facility] care team collaboration (see longitudinal plan of care) . A voluntary discussion about advanced care planning including importance of advanced directives, healthcare proxy and living will was discussed with the patient's daughter.  07/06/2020 EMMI educational information on Advance Directives as well as an mailed advance directive packet. . Community resource involvement encouraged Patient Goals/Self-Care Activities : Over the next 14 days . Review mailed EMMI education on Advance Directive  . Complete Advance Directive packet,  . Call LCSW if you have questions  . Have advance directive notarized and provide a copy to provider office      Outpatient Encounter Medications as of 07/04/2020  Medication Sig  . donepezil (ARICEPT) 10 MG tablet Take 1 tablet (10 mg total) by mouth at bedtime.  . Melatonin 5 MG CAPS Take 1 capsule (5 mg total) by mouth at bedtime.  . MULTIPLE VITAMIN PO Take by mouth daily.   Facility-Administered Encounter Medications as of  07/04/2020  Medication  . cyanocobalamin  ((VITAMIN B-12)) injection 1,000 mcg   Sammuel Hines, LCSW Care Management & Coordination  [facility] Family [facility] / [facility]   [phone] 3:12 PM

## 2020-07-04 NOTE — Patient Instructions (Signed)
  Ms. Mckenna  it was nice speaking with you. Please call me directly if you have questions about the goals we discussed. Goals Addressed            This Visit's Progress   . Effective Long-Term Care Planning       Patient Goals/Self-Care Activities : Over the next 14 days . Review mailed EMMI education on Advance Directive  . Complete Advance Directive packet,  . Call LCSW if you have questions  . Have advance directive notarized and provide a copy to provider office      Ms. Wilhelmi received Care Coordination services today:  1. Care Coordination services include personalized support from designated clinical staff supervised by her physician, including individualized plan of care and coordination with other care providers 2. 24/7 contact [phone] for assistance for urgent and routine care needs. 3. Care Coordination are voluntary services and be declined at any time by calling the office.  Patient verbalizes understanding of instructions provided today.    Follow up plan: Appointment scheduled for SW follow up with client by phone on: 07/18/20  Soundra Pilon, LCSW  [phone]

## 2020-07-11 ENCOUNTER — Ambulatory Visit (INDEPENDENT_AMBULATORY_CARE_PROVIDER_SITE_OTHER): Payer: Medicare Other | Admitting: Family Medicine

## 2020-07-11 ENCOUNTER — Encounter: Payer: Self-pay | Admitting: *Deleted

## 2020-07-11 ENCOUNTER — Other Ambulatory Visit: Payer: Self-pay

## 2020-07-11 DIAGNOSIS — F0391 Unspecified dementia with behavioral disturbance: Secondary | ICD-10-CM | POA: Diagnosis not present

## 2020-07-11 DIAGNOSIS — H547 Unspecified visual loss: Secondary | ICD-10-CM | POA: Diagnosis not present

## 2020-07-11 DIAGNOSIS — R634 Abnormal weight loss: Secondary | ICD-10-CM | POA: Diagnosis not present

## 2020-07-11 MED ORDER — MIRTAZAPINE 7.5 MG PO TABS: 7.5000 mg | ORAL_TABLET | Freq: Every day | ORAL | 2 refills | Status: DC

## 2020-07-11 NOTE — Progress Notes (Addendum)
S:Pharmacy consulted to complete medication reconciliation for geriatric clinic. Patient arrives in good spirits accompaniedwith herdaughter.Medication reconciliation completed by patient's daughter and brought medications to visit today.Medication adherence appears to be good with a multivitamin and melatonin nightly. Daughter reports patient was initially prescribed donepezil (Aricept) 5 mg daily, however never started due to daughter's fear of potential side-effects. Daughter reports after last PCP visit in February 2022, donepezil was increased to 10 mg daily and after 3-4 days of taking donepezil, patient reported headaches and daughter discontinued donepezil. Additionally, daughter reports patient is not sleeping well at night and inquired about increasing melatonin to 10 mg daily.   Medication Issues Identified: 1. Pt not taking donepezil 10 mg daily as prescribed - of note, pt never started the 5 mg dose prior to taking 10 mg  Plan: 1. Will defer to provider whether to restart donepezil (aricept) at a lower dose of 5 mg daily or discontinue therapy 2. Will defer to provider regarding insomnia management   Coralyn Helling, PharmD Candidate Isaias Sakai, PGY1 HSPAL Resident Fabio Neighbors, PharmD, BCPS, PGY2 Ambulatory Care Resident

## 2020-07-12 ENCOUNTER — Encounter: Payer: Self-pay | Admitting: Family Medicine

## 2020-07-12 NOTE — Progress Notes (Addendum)
1  Physical Exam Vitals and nursing note reviewed. Exam conducted with a chaperone present.  Constitutional:      General: She is not in acute distress.    Appearance: She is not ill-appearing or toxic-appearing.  Neck:     Vascular: No carotid bruit.  Cardiovascular:     Rate and Rhythm: Normal rate and regular rhythm.     Pulses: Normal pulses.     Heart sounds: No murmur (systolic ejection) heard.   Pulmonary:     Effort: Pulmonary effort is normal. No respiratory distress.     Breath sounds: Normal breath sounds. No wheezing.  Musculoskeletal:     Cervical back: No tenderness.  Skin:    Capillary Refill: Capillary refill takes less than 2 seconds.  Neurological:     General: No focal deficit present.     Mental Status: She is alert. She is confused.     GCS: GCS eye subscore is 4. GCS verbal subscore is 5. GCS motor subscore is 6.     Cranial Nerves: No cranial nerve deficit.     Sensory: Sensation is intact. No sensory deficit.     Motor: No weakness.     Coordination: Coordination normal. Finger-Nose-Finger Test normal.     Gait: Gait normal.     Deep Tendon Reflexes:     Reflex Scores:      Bicep reflexes are 3+ on the right side and 3+ on the left side.      Brachioradialis reflexes are 2+ on the right side and 2+ on the left side.      Patellar reflexes are 2+ on the right side and 2+ on the left side.

## 2020-07-15 ENCOUNTER — Encounter: Payer: Self-pay | Admitting: Family Medicine

## 2020-07-15 NOTE — Assessment & Plan Note (Signed)
Weight stable. Resolved

## 2020-07-15 NOTE — Assessment & Plan Note (Signed)
Established problem Mild-to-moderate dementia with dependence in iADLs and requiring some supervision for ADLs.  Behavioral issues primary related to anxiety which is a symptom of dementia.   We discussed coping strategies with caretaker.  Recommended a trial of mirtazapine 7.5 mg at bedtime initial dose titrating to effect and tolerance with target behavior her knocking on her daughter's bedroom door and ease of falling asleep at bedtime.    daughter told she may continue to use 5-10 mg of melatonin with the mirtazapine as needed.  RTC 2-4 weeks with PCP to assess effect and tolerance of mirtazapine  Note than patient has not been taking either the 5 nor 10 mg dose of donepezil - see pharmacy team note attached.

## 2020-07-15 NOTE — Assessment & Plan Note (Signed)
Uncertain if patient ever went to ophthalmology for evaluation of left eye cataract.

## 2020-07-15 NOTE — Progress Notes (Signed)
Provider:  Acquanetta Belling, MD Location:      Place of Service:     PCP: Derrel Nip, MD Patient Care Team: Derrel Nip, MD as PCP - General (Family Medicine) Soundra Pilon, LCSW as Social Worker (Licensed Clinical Social Worker)  Extended Emergency Contact Information Primary Emergency Contact: Clydia Llano [place] Home Phone: [phone] Mobile Phone: [phone] Relation: Daughter  Code Status: DNR Goals of Care: Advanced Directive information Advanced Directives 07/11/2020  Does Patient Have a Medical Advance Directive? No  Does patient want to make changes to medical advance directive? -  Would patient like information on creating a medical advance directive? No - Patient declined     [facility] Geriatrics Clinic:   Patient is accompanied by Dgt, Remi Deter Primary caregiver:  daughter Patient's Currently living arrangement:  daughter and SIL Patient information was obtained from historical medical records, lab reports, office notes and radiology reports  . History/Exam limitations:  dementia and communication barrier Language Primary Care Provider:   Janene Madeira Referring provider:  Janene Madeira Reason for referral:  Nocturnal behavior  ----------------------------------------------------------------------------------------------------------------------------------------------------------------------------------------------------------------------------------------------------------------   HPI by problems:  Chief Complaint  Patient presents with  . Medication Management   History obtained from daughter, Remi Deter Patient with prior diagnosis of dementia, 11/13/2016 in [facility] geri clinic. Ms Brayton Caves. Was independent in all her ADLs but dependent in her iADLs at that time. Currently, Ms Brayton Caves needs only supervision for her ADLs.   Her daughter believes there has been decline in her thinking and functional ability since that time.   Bo's  concern that brought her for the consult visit is her mother's behavior at bedtime.  Her mother will go to bed around 9 pm, then is up over the next hours knocking on her daughter's bedroom.  Her daughter wakes to go to work at 5 am; she becomes irritated with her mother because this behavior.   Her mother worries about her and her families safety a good deal according to [facility]. She frequently inquires if the doors and windows are locked.  She does not go out on her own, though going out shopping and to restaurants was once a favorite activity.   She can become irritable and stubborn when family tries to get her to do something she does not want to do.  Her daughter has tried melatonin 5 mg at bedtime that sometimes helps patient to fall asleep.  There has been no evidence of delusions or hallucinations. She is not wandering at night. No tearfulness, hand ringing, no sad facial affect, eating well per daughter   Patient has not been taking donepezil, 5 or 10 mg tablets.  See Pharmacy Team note attached.      Outpatient Encounter Medications as of 07/11/2020  Medication Sig  . Melatonin 5 MG CAPS Take 1 capsule (5 mg total) by mouth at bedtime.  . MULTIPLE VITAMIN PO Take by mouth daily.   Facility-Administered Encounter Medications as of 07/11/2020  Medication  . cyanocobalamin ((VITAMIN B-12)) injection 1,000 mcg    History Patient Active Problem List   Diagnosis Date Noted  . Weight loss, non-intentional 04/14/2018    Priority: High  . Dementia with behavioral disturbance (HCC) 07/02/2015    Priority: High  . B12 deficiency anemia 10/13/2019    Priority: Medium  . Macrocytic anemia 09/13/2019    Priority: Medium  . Fatigue 09/13/2019    Priority: Medium  . Sundowning 01/05/2019    Priority: Medium  . Caregiver  role strain 04/14/2018    Priority: Medium  . Bilateral hearing loss 09/13/2019    Priority: Low  . Age-related hearing loss 10/19/2016    Priority: Low  . Vision  decreased 09/16/2016    Priority: Low   Past Medical History:  Diagnosis Date  . Age-related hearing loss 10/19/2016  . Cataract    s/p R cataract surgery  . Dementia with behavioral disturbance (HCC) 07/02/2015   [facility] 10/30 (Administered with assistance of [place] interpreter in exam room (11/12/16)  . Reduced vision 09/16/2016   Past Surgical History:  Procedure Laterality Date  . CATARACT EXTRACTION Right 2012   Family History  Problem Relation Age of Onset  . Hypertension Mother    Social History   Socioeconomic History  . Marital status: Widowed    Spouse name: Not on file  . Number of children: Not on file  . Years of education: 97  . Highest education level: Not on file  Occupational History  . Not on file  Tobacco Use  . Smoking status: Never Smoker  . Smokeless tobacco: Never Used  Vaping Use  . Vaping Use: Never used  Substance and Sexual Activity  . Alcohol use: No    Alcohol/week: 0.0 standard drinks  . Drug use: No  . Sexual activity: Not Currently  Other Topics Concern  . Not on file  Social History Narrative   Lives at home with daughter and son-in-law.    Enjoys Retail banker and walking     Primary family support persons is daughter who is also her paid Merchandiser, retail.    Transportation to appointments provided by daughter [name].   Receives the following community support services SSI, food stamps and CAPS services.       Social Determinants of Health   Financial Resource Strain: Low Risk   . Difficulty of Paying Living Expenses: Not hard at all  Food Insecurity: No Food Insecurity  . Worried About Programme researcher, broadcasting/film/video in the Last Year: Never true  . Ran Out of Food in the Last Year: Never true  Transportation Needs: No Transportation Needs  . Lack of Transportation (Medical): No  . Lack of Transportation (Non-Medical): No  Physical Activity: Not on file  Stress: Not on file  Social Connections: Not on file     Basic Activities of Daily Living  Dressing:  Self-care Eating: Self-care Ambulation: Self-care Toileting: Self-care Bathing: Partial assistance  Instrumental Activities of Daily Living Shopping: Total assistance House/Yard Work: Total assistance Administration of medications: Total assistance Finances: Total assistance Telephone: Total assistance Transportation: Total assistance  Caregivers in home: daughter and SIL - they work on different shift.    FALLS in last five office visits:  Fall Risk  07/11/2020 05/13/2020 02/12/2020 09/12/2019 01/03/2019  Falls in the past year? 0 1 0 1 0  Number falls in past yr: 0 1 0 0 -  Injury with Fall? - 0 - 0 -  Follow up - - - Falls evaluation completed -    Health Maintenance reviewed: Immunization History  Administered Date(s) Administered  . Influenza,inj,Quad PF,6+ Mos 06/13/2015, 05/25/2018, 01/03/2019, 05/13/2020  . PFIZER(Purple Top)SARS-COV-2 Vaccination 05/05/2019, 05/30/2019, 02/12/2020  . Pneumococcal Conjugate-13 10/19/2016  . Pneumococcal Polysaccharide-23 05/25/2018  . Tdap 05/13/2020   Health Maintenance Topics with due status: Overdue     Topic Date Due   DEXA SCAN Never done     Vital Signs Weight: 147 lb (66.7 kg) Body mass index is 28.71 kg/m. CrCl cannot be calculated (  Patient's most recent lab result is older than the maximum 21 days allowed.). Body surface area is 1.68 meters squared. Vitals:   07/11/20 1403  Weight: 147 lb (66.7 kg)  Height: 5' (1.524 m)   Wt Readings from Last 3 Encounters:  07/11/20 147 lb (66.7 kg)  05/13/20 150 lb 12.8 oz (68.4 kg)  02/12/20 156 lb (70.8 kg)    Hearing Screening   Method: Audiometry   125Hz  250Hz  500Hz  1000Hz  2000Hz  3000Hz  4000Hz  6000Hz  8000Hz   Right ear:   Fail Fail Fail  Fail    Left ear:   Fail Fail Fail  Fail      Visual Acuity Screening   Right eye Left eye Both eyes  Without correction:  20/70   With correction:     Comments: Cataract in right eye  See medical student physical exam that was  conducted with in-room supervision by Dr C. Anderson.    No flowsheet data found.  No flowsheet data found.      Montreal Cognitive Assessment  11/13/2016  Visuospatial/ Executive (0/5) 1  Naming (0/3) 1  Attention: Read list of digits (0/2) 2  Attention: Read list of letters (0/1) 0  Attention: Serial 7 subtraction starting at 100 (0/3) 1  Language: Repeat phrase (0/2) 2  Language : Fluency (0/1) 0  Abstraction (0/2) 0  Delayed Recall (0/5) 0  Orientation (0/6) 2  Total 9  Adjusted Score (based on education) 10     Labs   Lab Results  Component Value Date   VITAMINB12 833 10/13/2019   CrCl cannot be calculated (Patient's most recent lab result is older than the maximum 21 days allowed.).   No results found for: HGBA1C   @10RELATIVEDAYS @  Hearing Screening   Method: Audiometry   125Hz  250Hz  500Hz  1000Hz  2000Hz  3000Hz  4000Hz  6000Hz  8000Hz   Right ear:   Fail Fail Fail  Fail    Left ear:   Fail Fail Fail  Fail      Visual Acuity Screening   Right eye Left eye Both eyes  Without correction:  20/70   With correction:     Comments: Cataract in right eye   39 minutes face to face were spent in total with interdisciplinary discussion, patient and caretaker counseling and coordination of care took more than 20 minutes. The Geriatric interdisciplinary team meet to discuss the patient's assessment, problem list, and recommendations.  The interdisciplinary team consisted of representatives from medicine, pharmacy,  and social work. The interdisciplinary team meet with the patient and caretakers to review the team's findings, assessments, and recommendations.  [email or web address]

## 2020-07-18 ENCOUNTER — Ambulatory Visit: Payer: Medicare Other | Admitting: Licensed Clinical Social Worker

## 2020-07-18 NOTE — Chronic Care Management (AMB) (Signed)
Care Management   Clinical Social Work Note  07/18/2020 Name: Ebony Pierce MRN: [id number] DOB: 1935-05-28  Ebony Pierce is a 85 y.o. year old female who is a primary care patient of Derrel Nip, MD. The CCM team was consulted to assist the patient with chronic disease management and/or care coordination needs related to: Advanced Directive Education and assessment of needs for [facility] clinic.   Engaged with patient's daughter by telephone for follow up visit in response to provider referral for social work chronic care management and care coordination services.   Consent to Services:  The patient and daughter was given information about Chronic Care Management services, agreed to services, and gave verbal consent prior to initiation of services.  Please see initial visit note for detailed documentation.   Patient agreed to services and consent obtained.   Assessment: Patient's daughter Ebony Pierce provided all information during this encounter.. See Care Plan below for interventions and patient self-care actives. Recent life changes Ebony Pierce: none reported Recommendation: Patient may benefit from, and is in agreement to bring advance directive to next office appointment.  Follow up Plan: Patient does not require or desire continued follow-up. Will contact the office if needed, Patient may benefit from and is in agreement for CCM LCSW to remain part of care team for the next 90 days.  If no needs are identified in the next 90 days, CCM LCSW will disconnect from the care team.     Review of patient past medical history, allergies, medications, and health status, including review of relevant consultants reports was performed today as part of a comprehensive evaluation and provision of chronic care management and care coordination services.     SDOH (Social Determinants of Health) assessments and interventions performed:    Advanced Directives Status: See Care Plan for related  entries.  CCM Care Plan  No Known Allergies  Outpatient Encounter Medications as of 07/18/2020  Medication Sig  . donepezil (ARICEPT) 10 MG tablet Take 1 tablet (10 mg total) by mouth at bedtime. (Patient not taking: No sig reported)  . Melatonin 5 MG CAPS Take 1 capsule (5 mg total) by mouth at bedtime.  . mirtazapine (REMERON) 7.5 MG tablet Take 1 tablet (7.5 mg total) by mouth at bedtime.  . MULTIPLE VITAMIN PO Take by mouth daily.   Facility-Administered Encounter Medications as of 07/18/2020  Medication  . cyanocobalamin ((VITAMIN B-12)) injection 1,000 mcg    Patient Active Problem List   Diagnosis Date Noted  . B12 deficiency anemia 10/13/2019  . Macrocytic anemia 09/13/2019  . Bilateral hearing loss 09/13/2019  . Fatigue 09/13/2019  . Sundowning 01/05/2019  . Caregiver role strain 04/14/2018  . Age-related hearing loss 10/19/2016  . Vision decreased 09/16/2016  . Dementia with behavioral disturbance (HCC) 07/02/2015    Conditions to be addressed/monitored: Dementia;   Care Plan : General Social Work (Adult)  Updates made by Ebony Pilon, LCSW since 07/18/2020 12:00 AM  Problem: No Advance Directive   Goal: Effective Long-Term Care Planning   Start Date: 07/04/2020  Recent Progress: On track  Priority: Medium  Current barriers:   . Patient does not have an advance directive.  . Needs education, support and coordination in order to meet this need. Clinical Goal(s): Over the next 30 days, the patient and daughter will review e-mailed EMMI education on advance directive, complete advance directive packet and have notarized.  Interventions: . Inter-disciplinary care team and PCP collaboration (see longitudinal plan of care) . A voluntary discussion about  advanced care planning including importance of advanced directives, healthcare proxy and living will was discussed with the patient's daughter. Answered all questions . Reminded daughter of 08/13/20 appointment   Patient Goals/Self-Care Activities : Over the next 30 days . Complete Advance Directive packet,  . Call LCSW if you have questions  . Have advance directive notarized at providers office during appointment 08/13/20      Ebony Hines, LCSW Care Management & Coordination  [facility]   [phone] 2:03 PM

## 2020-07-18 NOTE — Patient Instructions (Signed)
Visit Information  Goals Addressed            This Visit's Progress   . Effective Long-Term Care Planning   On track    Patient Goals/Self-Care Activities :  . Complete Advance Directive packet,  . Call LCSW if you have questions  . Have advance directive notarized at providers office during appointment 08/13/20      Patient's daughter verbalizes understanding of instructions provided today.   No further follow up required: by LCSW at this time  Sammuel Hines, [facility] Care Management & Coordination  [phone]

## 2020-08-13 ENCOUNTER — Other Ambulatory Visit: Payer: Self-pay

## 2020-08-13 ENCOUNTER — Ambulatory Visit (INDEPENDENT_AMBULATORY_CARE_PROVIDER_SITE_OTHER): Payer: Medicare Other | Admitting: Family Medicine

## 2020-08-13 ENCOUNTER — Encounter: Payer: Self-pay | Admitting: Family Medicine

## 2020-08-13 VITALS — BP 128/74 | HR 74 | Ht 60.0 in | Wt 149.8 lb

## 2020-08-13 DIAGNOSIS — F0391 Unspecified dementia with behavioral disturbance: Secondary | ICD-10-CM

## 2020-08-13 DIAGNOSIS — E538 Deficiency of other specified B group vitamins: Secondary | ICD-10-CM | POA: Diagnosis not present

## 2020-08-13 DIAGNOSIS — E785 Hyperlipidemia, unspecified: Secondary | ICD-10-CM

## 2020-08-13 DIAGNOSIS — F05 Delirium due to known physiological condition: Secondary | ICD-10-CM

## 2020-08-13 DIAGNOSIS — D519 Vitamin B12 deficiency anemia, unspecified: Secondary | ICD-10-CM | POA: Diagnosis not present

## 2020-08-13 MED ORDER — MELATONIN 5 MG PO CAPS: 1.0000 | ORAL_CAPSULE | Freq: Every day | ORAL | 0 refills | Status: AC

## 2020-08-13 NOTE — Patient Instructions (Signed)
It was wonderful seeing you today.  I am sorry that that medication did not help much with the sleep but it is okay for you to continue giving your mother 10 mg melatonin nightly.  I am collecting some lab work and will let you know if we need to resume the vitamin B12 injections.  I will also let you know if there are any other abnormalities in her lab work.  If you have any questions or concerns please feel free to call the clinic.  I hope you have a wonderful afternoon!

## 2020-08-13 NOTE — Progress Notes (Signed)
    SUBJECTIVE:   CHIEF COMPLAINT / HPI:   Patient presents for follow-up after geriatric clinic Patient was recently seen in the geriatric clinic for further evaluation of her dementia.  The major concern was the fact that the patient repeatedly woke up and would knock on her daughter's door concerned that the doors and windows were not locked.  After full evaluation geriatric clinic they recommended a trial of mirtazapine 7.5 mg daily on top of 5-10 mg of melatonin that the patient was taking nightly.  Patient's daughter reports that she has not been giving the mirtazapine because it took too long to work and that the 10 mg of melatonin works better.  Vitamin B12 deficiency Patient's daughter is concerned about if her mother needs continued B12 shots.  It has been almost 1 year since her last B12 check.  We will collect lab work today to determine the need for future injections.  Denies any new symptoms of signs of deficiency.  OBJECTIVE:   BP 128/74   Pulse 74   Ht 5' (1.524 m)   Wt 149 lb 12.8 oz (67.9 kg)   SpO2 97%   BMI 29.26 kg/m   General: Pleasant well-appearing 85 year old female in no acute distress Cardiac: Regular rate and rhythm, no murmurs appreciated Respiratory: Normal breathing, lungs clear to auscultation bilaterally Abdomen: Soft, nontender, positive bowel sounds MSK: Ambulates without difficulty   ASSESSMENT/PLAN:   Dementia with behavioral disturbance [employer] Patient's daughter reports that the patient has been doing well but that she has not been giving her the mirtazapine because it was not effective.  She has been giving her 10 mg of melatonin which is effective.  She will continue using 10 mg melatonin and I have discontinued the mirtazapine on her med list.  We will follow-up as needed.  B12 deficiency anemia Patient has not been receiving her monthly B12 injections and her daughter is concerned that she may need further injections.  Checking a B12 level  today but feel that she will most likely need this.  B12 level came back at 99.  I have ordered B12 injections which the patient will receive weekly for 1 month followed by monthly.  We we will recheck a vitamin B12 level in 6 months. -Patient has standing order for B12 injections - Weekly B12 injections 1000 mcg for 4 weeks followed by monthly injections - Recheck B12 level in 6 months     Ebony Nip, MD [facility]

## 2020-08-14 ENCOUNTER — Telehealth: Payer: Self-pay | Admitting: Family Medicine

## 2020-08-14 DIAGNOSIS — E538 Deficiency of other specified B group vitamins: Secondary | ICD-10-CM

## 2020-08-14 LAB — LIPID PANEL
Chol/HDL Ratio: 3.1 ratio (ref 0.0–4.4)
Cholesterol, Total: 166 mg/dL (ref 100–199)
HDL: 54 mg/dL (ref >39)
LDL Chol Calc (NIH): 98 mg/dL (ref 0–99)
Triglycerides: 74 mg/dL (ref 0–149)
VLDL Cholesterol Cal: 14 mg/dL (ref 5–40)

## 2020-08-14 LAB — CBC
Hematocrit: 43.5 % (ref 34.0–46.6)
Hemoglobin: 14.1 g/dL (ref 11.1–15.9)
MCH: 28 pg (ref 26.6–33.0)
MCHC: 32.4 g/dL (ref 31.5–35.7)
MCV: 87 fL (ref 79–97)
Platelets: 332 10*3/uL (ref 150–450)
RBC: 5.03 x10E6/uL (ref 3.77–5.28)
RDW: 13.5 % (ref 11.7–15.4)
WBC: 6.2 10*3/uL (ref 3.4–10.8)

## 2020-08-14 LAB — COMPREHENSIVE METABOLIC PANEL
ALT: 16 IU/L (ref 0–32)
AST: 19 IU/L (ref 0–40)
Albumin/Globulin Ratio: 1.5 (ref 1.2–2.2)
Albumin: 4.3 g/dL (ref 3.6–4.6)
Alkaline Phosphatase: 80 IU/L (ref 44–121)
BUN/Creatinine Ratio: 17 (ref 12–28)
BUN: 15 mg/dL (ref 8–27)
Bilirubin Total: <0.2 mg/dL (ref 0.0–1.2)
CO2: 21 mmol/L (ref 20–29)
Calcium: 10 mg/dL (ref 8.7–10.3)
Chloride: 104 mmol/L (ref 96–106)
Creatinine, Ser: 0.86 mg/dL (ref 0.57–1.00)
Globulin, Total: 2.9 g/dL (ref 1.5–4.5)
Glucose: 92 mg/dL (ref 65–99)
Potassium: 4.5 mmol/L (ref 3.5–5.2)
Sodium: 143 mmol/L (ref 134–144)
Total Protein: 7.2 g/dL (ref 6.0–8.5)
eGFR: 67 mL/min/{1.73_m2} (ref >59)

## 2020-08-14 LAB — VITAMIN B12: Vitamin B-12: 99 pg/mL — ABNORMAL LOW (ref 232–1245)

## 2020-08-14 MED ORDER — CYANOCOBALAMIN 1000 MCG/ML IJ SOLN: 1000.0000 ug | Freq: Once | INTRAMUSCULAR | Status: DC

## 2020-08-14 NOTE — Assessment & Plan Note (Signed)
Patient's daughter reports that the patient has been doing well but that she has not been giving her the mirtazapine because it was not effective.  She has been giving her 10 mg of melatonin which is effective.  She will continue using 10 mg melatonin and I have discontinued the mirtazapine on her med list.  We will follow-up as needed.

## 2020-08-14 NOTE — Telephone Encounter (Signed)
Attempted to call patient's daughter regarding lab results.  Her vitamin B12 is low and she needs to really start the B12 treatment that she was previously on.  She will need weekly injections for 1 month followed by monthly injections.  She will need a repeat vitamin B12 in approximately 6 months orders have been placed for this.

## 2020-08-14 NOTE — Assessment & Plan Note (Signed)
Patient has not been receiving her monthly B12 injections and her daughter is concerned that she may need further injections.  Checking a B12 level today but feel that she will most likely need this.  B12 level came back at 99.  I have ordered B12 injections which the patient will receive weekly for 1 month followed by monthly.  We we will recheck a vitamin B12 level in 6 months. -Patient has standing order for B12 injections - Weekly B12 injections 1000 mcg for 4 weeks followed by monthly injections - Recheck B12 level in 6 months

## 2020-08-20 ENCOUNTER — Encounter: Payer: Self-pay | Admitting: Family Medicine

## 2020-09-03 ENCOUNTER — Ambulatory Visit (INDEPENDENT_AMBULATORY_CARE_PROVIDER_SITE_OTHER): Payer: Medicare Other

## 2020-09-03 ENCOUNTER — Other Ambulatory Visit: Payer: Self-pay

## 2020-09-03 DIAGNOSIS — D518 Other vitamin B12 deficiency anemias: Secondary | ICD-10-CM | POA: Diagnosis not present

## 2020-09-03 MED ORDER — CYANOCOBALAMIN 1000 MCG/ML IJ SOLN
1000.0000 ug | Freq: Once | INTRAMUSCULAR | Status: AC
  Administered 2020-09-03: 1000 ug via INTRAMUSCULAR

## 2020-09-03 NOTE — Progress Notes (Signed)
Patient presents for weekly B12 injection. (1 of 4) Injection given LD without complication.  Patient scheduled for 6/21 for next injection.  Reminder card given.

## 2020-09-10 ENCOUNTER — Other Ambulatory Visit: Payer: Self-pay

## 2020-09-10 ENCOUNTER — Ambulatory Visit (INDEPENDENT_AMBULATORY_CARE_PROVIDER_SITE_OTHER): Payer: Medicare Other

## 2020-09-10 DIAGNOSIS — E538 Deficiency of other specified B group vitamins: Secondary | ICD-10-CM | POA: Diagnosis not present

## 2020-09-11 MED ORDER — CYANOCOBALAMIN 1000 MCG/ML IJ SOLN
1000.0000 ug | Freq: Once | INTRAMUSCULAR | Status: AC
  Administered 2020-09-10: 1000 ug via INTRAMUSCULAR

## 2020-09-11 NOTE — Progress Notes (Signed)
Pt is here for a b12 injection today.    Date of last office visit that b12 was discussed 08/13/2020  Last injection was 09/03/2020  Injection given in RD, pt tolerated well and scheduled next weekly B12 injection for 6/28 at 1530.   Veronda Prude, RN

## 2020-09-17 ENCOUNTER — Other Ambulatory Visit: Payer: Self-pay

## 2020-09-17 ENCOUNTER — Ambulatory Visit (INDEPENDENT_AMBULATORY_CARE_PROVIDER_SITE_OTHER): Payer: Medicare Other

## 2020-09-17 DIAGNOSIS — D539 Nutritional anemia, unspecified: Secondary | ICD-10-CM

## 2020-09-17 DIAGNOSIS — D518 Other vitamin B12 deficiency anemias: Secondary | ICD-10-CM

## 2020-09-17 NOTE — Progress Notes (Signed)
Pt is here for a b12 injection today.     Date of last office visit that b12 was discussed 08/13/2020.   Last injection was 09/10/2020.   Injection given in LD without complication.   #4 injection scheduled for 7/5.

## 2020-09-24 ENCOUNTER — Ambulatory Visit: Payer: Medicare Other

## 2020-09-24 ENCOUNTER — Other Ambulatory Visit: Payer: Self-pay

## 2020-09-28 DIAGNOSIS — Z20822 Contact with and (suspected) exposure to covid-19: Secondary | ICD-10-CM | POA: Diagnosis not present

## 2020-10-01 ENCOUNTER — Other Ambulatory Visit: Payer: Medicare Other

## 2020-10-01 ENCOUNTER — Other Ambulatory Visit: Payer: Self-pay

## 2020-10-01 DIAGNOSIS — E538 Deficiency of other specified B group vitamins: Secondary | ICD-10-CM

## 2020-10-02 LAB — VITAMIN B12: Vitamin B-12: 669 pg/mL (ref 232–1245)

## 2020-11-03 ENCOUNTER — Encounter: Payer: Self-pay | Admitting: Family Medicine

## 2020-11-04 MED ORDER — PAXLOVID 10 X 150 MG & 10 X 100MG PO TBPK: 2.0000 | ORAL_TABLET | Freq: Two times a day (BID) | ORAL | 0 refills | Status: AC

## 2020-11-04 NOTE — Telephone Encounter (Signed)
Patient's daughter calls nurse line regarding patient testing positive for COVID this morning.   Symptom onset Friday, 8/12. Reports cough and fatigue.   Been giving OTC dayquil and nyquil. Denies current [facility]. Advised of supportive measures and provided with ED precautions.   Please advise if patient would be a good candidate for infusion therapy or oral antivitals.   Veronda Prude, RN

## 2021-02-13 ENCOUNTER — Encounter (HOSPITAL_COMMUNITY): Payer: Self-pay | Admitting: *Deleted

## 2021-02-13 ENCOUNTER — Other Ambulatory Visit: Payer: Self-pay

## 2021-02-13 ENCOUNTER — Emergency Department (HOSPITAL_COMMUNITY): Payer: Medicare Other

## 2021-02-13 ENCOUNTER — Emergency Department (HOSPITAL_COMMUNITY)
Admission: EM | Admit: 2021-02-13 | Discharge: 2021-02-13 | Discharge status: Against Medical Advice | Payer: Medicare Other | Attending: Emergency Medicine | Admitting: Emergency Medicine

## 2021-02-13 DIAGNOSIS — R9431 Abnormal electrocardiogram [ECG] [EKG]: Secondary | ICD-10-CM | POA: Diagnosis not present

## 2021-02-13 DIAGNOSIS — R0602 Shortness of breath: Secondary | ICD-10-CM | POA: Diagnosis not present

## 2021-02-13 DIAGNOSIS — F0390 Unspecified dementia without behavioral disturbance: Secondary | ICD-10-CM | POA: Insufficient documentation

## 2021-02-13 DIAGNOSIS — R1013 Epigastric pain: Secondary | ICD-10-CM | POA: Diagnosis not present

## 2021-02-13 DIAGNOSIS — R61 Generalized hyperhidrosis: Secondary | ICD-10-CM | POA: Diagnosis not present

## 2021-02-13 DIAGNOSIS — R110 Nausea: Secondary | ICD-10-CM | POA: Insufficient documentation

## 2021-02-13 DIAGNOSIS — R109 Unspecified abdominal pain: Secondary | ICD-10-CM | POA: Insufficient documentation

## 2021-02-13 LAB — URINALYSIS, ROUTINE W REFLEX MICROSCOPIC
Bacteria, UA: NONE SEEN
Bilirubin Urine: NEGATIVE
Glucose, UA: NEGATIVE mg/dL
Hgb urine dipstick: NEGATIVE
Ketones, ur: 5 mg/dL — AB
Nitrite: NEGATIVE
Protein, ur: NEGATIVE mg/dL
Specific Gravity, Urine: 1.02 (ref 1.005–1.030)
pH: 6 (ref 5.0–8.0)

## 2021-02-13 LAB — CBC WITH DIFFERENTIAL/PLATELET
Abs Immature Granulocytes: 0.05 10*3/uL (ref 0.00–0.07)
Basophils Absolute: 0 10*3/uL (ref 0.0–0.1)
Basophils Relative: 0 %
Eosinophils Absolute: 0 10*3/uL (ref 0.0–0.5)
Eosinophils Relative: 0 %
HCT: 50.3 % — ABNORMAL HIGH (ref 36.0–46.0)
Hemoglobin: 15.6 g/dL — ABNORMAL HIGH (ref 12.0–15.0)
Immature Granulocytes: 1 %
Lymphocytes Relative: 7 %
Lymphs Abs: 0.6 10*3/uL — ABNORMAL LOW (ref 0.7–4.0)
MCH: 28.3 pg (ref 26.0–34.0)
MCHC: 31 g/dL (ref 30.0–36.0)
MCV: 91.3 fL (ref 80.0–100.0)
Monocytes Absolute: 0.3 10*3/uL (ref 0.1–1.0)
Monocytes Relative: 4 %
Neutro Abs: 8.5 10*3/uL — ABNORMAL HIGH (ref 1.7–7.7)
Neutrophils Relative %: 88 %
Platelets: 305 10*3/uL (ref 150–400)
RBC: 5.51 MIL/uL — ABNORMAL HIGH (ref 3.87–5.11)
RDW: 13.5 % (ref 11.5–15.5)
WBC: 9.5 10*3/uL (ref 4.0–10.5)
nRBC: 0 % (ref 0.0–0.2)

## 2021-02-13 LAB — COMPREHENSIVE METABOLIC PANEL
ALT: 17 U/L (ref 0–44)
AST: 28 U/L (ref 15–41)
Albumin: 4.1 g/dL (ref 3.5–5.0)
Alkaline Phosphatase: 69 U/L (ref 38–126)
Anion gap: 12 (ref 5–15)
BUN: 22 mg/dL (ref 8–23)
CO2: 24 mmol/L (ref 22–32)
Calcium: 10.2 mg/dL (ref 8.9–10.3)
Chloride: 99 mmol/L (ref 98–111)
Creatinine, Ser: 0.85 mg/dL (ref 0.44–1.00)
GFR, Estimated: >60 mL/min (ref >60)
Glucose, Bld: 145 mg/dL — ABNORMAL HIGH (ref 70–99)
Potassium: 4.4 mmol/L (ref 3.5–5.1)
Sodium: 135 mmol/L (ref 135–145)
Total Bilirubin: 0.8 mg/dL (ref 0.3–1.2)
Total Protein: 7.5 g/dL (ref 6.5–8.1)

## 2021-02-13 LAB — LIPASE, BLOOD: Lipase: 31 U/L (ref 11–51)

## 2021-02-13 MED ORDER — MORPHINE SULFATE (PF) 2 MG/ML IV SOLN
2.0000 mg | Freq: Once | INTRAVENOUS | Status: AC
  Administered 2021-02-13: 2 mg via INTRAVENOUS
  Filled 2021-02-13: qty 1

## 2021-02-13 MED ORDER — ONDANSETRON HCL 4 MG/2ML IJ SOLN
4.0000 mg | Freq: Once | INTRAMUSCULAR | Status: AC
  Administered 2021-02-13: 4 mg via INTRAVENOUS
  Filled 2021-02-13: qty 2

## 2021-02-13 NOTE — ED Notes (Signed)
MD at bedside. 

## 2021-02-13 NOTE — ED Notes (Signed)
Pt ambulatory to restroom with daughter  

## 2021-02-13 NOTE — ED Triage Notes (Signed)
Pt states sob since this am that increased after eating.  The pt became diaphoretic and nauseated, so the daughter brought her to the ED.

## 2021-02-13 NOTE — ED Provider Notes (Signed)
[place] EMERGENCY DEPARTMENT Provider Note   CSN: [id number] Arrival date & time: 02/13/21  1845     History Chief Complaint  Patient presents with   Abdominal Pain    Ebony Pierce is a 85 y.o. female.  Patient with history of dementia presents ER chief complaint of abdominal pain.  Daughter states that she notices pain today after eating she became diaphoretic and nauseated.  Denies any vomiting but complaining of right-sided abdominal pain.  No recent reports of fevers or cough.  No diarrhea vomiting reported.      Past Medical History:  Diagnosis Date   Age-related hearing loss 10/19/2016   Cataract    s/p R cataract surgery   Dementia with behavioral disturbance 07/02/2015   [facility] 10/30 (Administered with assistance of [place] interpreter in exam room (11/12/16)   Reduced vision 09/16/2016   Weight loss, non-intentional 04/14/2018    Patient Active Problem List   Diagnosis Date Noted   B12 deficiency anemia 10/13/2019   Macrocytic anemia 09/13/2019   Bilateral hearing loss 09/13/2019   Fatigue 09/13/2019   Sundowning 01/05/2019   Caregiver role strain 04/14/2018   Age-related hearing loss 10/19/2016   Vision decreased 09/16/2016   Dementia with behavioral disturbance 07/02/2015    Past Surgical History:  Procedure Laterality Date   CATARACT EXTRACTION Right 2012     OB History   No obstetric history on file.     Family History  Problem Relation Age of Onset   Hypertension Mother     Social History   Tobacco Use   Smoking status: Never   Smokeless tobacco: Never  Vaping Use   Vaping Use: Never used  Substance Use Topics   Alcohol use: No    Alcohol/week: 0.0 standard drinks   Drug use: No    Home Medications Prior to Admission medications   Medication Sig Start Date End Date Taking? Authorizing Provider  donepezil (ARICEPT) 10 MG tablet Take 1 tablet (10 mg total) by mouth at bedtime. Patient not taking: No sig  reported 05/13/20   Gifford Shave, MD  Melatonin 5 MG CAPS Take 1-2 capsules (5-10 mg total) by mouth at bedtime. 08/13/20   Gifford Shave, MD  MULTIPLE VITAMIN PO Take by mouth daily.    [provider]    Allergies    Patient has no known allergies.  Review of Systems   Review of Systems  Constitutional:  Negative for fever.  HENT:  Negative for ear pain.   Eyes:  Negative for pain.  Respiratory:  Negative for cough.   Cardiovascular:  Negative for chest pain.  Gastrointestinal:  Positive for abdominal pain.  Genitourinary:  Negative for flank pain.  Musculoskeletal:  Negative for back pain.  Skin:  Negative for rash.  Neurological:  Negative for headaches.   Physical Exam Updated Vital Signs BP 118/72   Pulse 62   Temp (!) 97.4 F (36.3 C)   Resp 19   Ht 5' (1.524 m)   Wt 67.6 kg   SpO2 100%   BMI 29.10 kg/m   Physical Exam Constitutional:      General: She is not in acute distress.    Appearance: Normal appearance.  HENT:     Head: Normocephalic.     Nose: Nose normal.  Eyes:     Extraocular Movements: Extraocular movements intact.  Cardiovascular:     Rate and Rhythm: Normal rate.  Pulmonary:     Effort: Pulmonary effort is normal.  Abdominal:  Comments: Moderate tenderness in the right abdominal region  Musculoskeletal:        General: Normal range of motion.     Cervical back: Normal range of motion.  Neurological:     General: No focal deficit present.     Mental Status: She is alert. Mental status is at baseline.    ED Results / Procedures / Treatments   Labs (all labs ordered are listed, but only abnormal results are displayed) Labs Reviewed  CBC WITH DIFFERENTIAL/PLATELET - Abnormal; Notable for the following components:      Result Value   RBC 5.51 (*)    Hemoglobin 15.6 (*)    HCT 50.3 (*)    Neutro Abs 8.5 (*)    Lymphs Abs 0.6 (*)    All other components within normal limits  COMPREHENSIVE METABOLIC PANEL - Abnormal;  Notable for the following components:   Glucose, Bld 145 (*)    All other components within normal limits  URINALYSIS, ROUTINE W REFLEX MICROSCOPIC - Abnormal; Notable for the following components:   Ketones, ur 5 (*)    Leukocytes,Ua TRACE (*)    All other components within normal limits  LIPASE, BLOOD    EKG None  Radiology DG Chest Port 1 View  Result Date: 02/13/2021 CLINICAL DATA:  Short of breath EXAM: PORTABLE CHEST 1 VIEW COMPARISON:  None. FINDINGS: Single frontal view of the chest demonstrates mild enlargement the cardiac silhouette. Background interstitial prominence throughout the lungs is likely related to chronic scarring. No airspace disease, effusion, or pneumothorax. IMPRESSION: 1. No acute intrathoracic process. Electronically Signed   By: Randa Ngo M.D.   On: 02/13/2021 20:28    Procedures Procedures   Medications Ordered in ED Medications  morphine 2 MG/ML injection 2 mg (2 mg Intravenous Given 02/13/21 1956)  ondansetron (ZOFRAN) injection 4 mg (4 mg Intravenous Given 02/13/21 1956)    ED Course  I have reviewed the triage vital signs and the nursing notes.  Pertinent labs & imaging results that were available during my care of the patient were reviewed by me and considered in my medical decision making (see chart for details).    MDM Rules/Calculators/A&P                           Patient presents with right-sided abdominal pain.  Appears right mid abdominal region.  Differential includes gallbladder pain versus acute appendicitis versus constipation versus bowel gas pain versus other.  Labs are sent these are unremarkable vital signs within normal limits urinalysis is negative.  CT abdomen pelvis pursued, however the patient pulled out her IV line.  Nursing staff was about to place a repeat IV, however the daughter states that the patient no longer has any pain and they decline a CT imaging.  Risk and benefits of CT imaging discussed including the  risk of missed aortic pathology such as appendicitis colitis, gallbladder disease or other.  Family states that she has no recurrence of pain still and prefers to go home.  Patient has dementia but daughter is a Media planner and has decision-making capacity.  Family refuses further studies and will be leaving AGAINST MEDICAL ADVICE.  I invited him to return anytime to the change of mind or have recurrent pain or any additional concerns otherwise advised to follow the primary care doctor within the next 2 to 3 days.  Final Clinical Impression(s) / ED Diagnoses Final diagnoses:  Abdominal pain, unspecified abdominal location  Rx / DC Orders ED Discharge Orders     None        Cheryll Cockayne, MD 02/13/21 2137

## 2021-02-13 NOTE — Discharge Instructions (Signed)
Call your primary care doctor or specialist as discussed in the next 2-3 days.   Return immediately back to the ER if:  Your symptoms worsen within the next 12-24 hours. You develop new symptoms such as new fevers, persistent vomiting, new pain, shortness of breath, or new weakness or numbness, or if you have any other concerns.  

## 2021-02-13 NOTE — ED Notes (Signed)
Pt leaving AMA. Daughter at bedside. Pt is dressed and sitting in chair. IV removed by pt. Dicussed the risk of leaving with pt and family member, they verbalized understanding. Pt ambulatory upon departure.

## 2021-02-13 NOTE — ED Notes (Signed)
Urine culture sent down with specimen

## 2021-02-13 NOTE — ED Notes (Signed)
Pt has ripped out IV - daughter wants to speak to MD - daughter is hesitant about staying for CT scan - MD notified

## 2021-02-16 ENCOUNTER — Inpatient Hospital Stay (HOSPITAL_COMMUNITY)
Admission: EM | Admit: 2021-02-16 | Discharge: 2021-02-28 | DRG: 417 | Disposition: A | Discharge status: Home Health | Payer: Medicare Other | Attending: Family Medicine | Admitting: Family Medicine

## 2021-02-16 ENCOUNTER — Observation Stay (HOSPITAL_COMMUNITY): Payer: Medicare Other

## 2021-02-16 ENCOUNTER — Ambulatory Visit (HOSPITAL_COMMUNITY)
Admission: EM | Admit: 2021-02-16 | Discharge: 2021-02-16 | Disposition: A | Discharge status: Home / Self Care | Payer: Medicare Other

## 2021-02-16 ENCOUNTER — Other Ambulatory Visit: Payer: Self-pay

## 2021-02-16 ENCOUNTER — Emergency Department (HOSPITAL_COMMUNITY): Payer: Medicare Other

## 2021-02-16 ENCOUNTER — Encounter (HOSPITAL_COMMUNITY): Payer: Self-pay | Admitting: Emergency Medicine

## 2021-02-16 DIAGNOSIS — K760 Fatty (change of) liver, not elsewhere classified: Secondary | ICD-10-CM | POA: Diagnosis present

## 2021-02-16 DIAGNOSIS — R7989 Other specified abnormal findings of blood chemistry: Secondary | ICD-10-CM | POA: Diagnosis present

## 2021-02-16 DIAGNOSIS — K832 Perforation of bile duct: Secondary | ICD-10-CM | POA: Diagnosis not present

## 2021-02-16 DIAGNOSIS — Z79899 Other long term (current) drug therapy: Secondary | ICD-10-CM | POA: Diagnosis not present

## 2021-02-16 DIAGNOSIS — R1011 Right upper quadrant pain: Secondary | ICD-10-CM | POA: Diagnosis present

## 2021-02-16 DIAGNOSIS — R932 Abnormal findings on diagnostic imaging of liver and biliary tract: Secondary | ICD-10-CM | POA: Diagnosis not present

## 2021-02-16 DIAGNOSIS — K8050 Calculus of bile duct without cholangitis or cholecystitis without obstruction: Secondary | ICD-10-CM | POA: Diagnosis not present

## 2021-02-16 DIAGNOSIS — Q453 Other congenital malformations of pancreas and pancreatic duct: Secondary | ICD-10-CM | POA: Diagnosis not present

## 2021-02-16 DIAGNOSIS — K82A1 Gangrene of gallbladder in cholecystitis: Secondary | ICD-10-CM | POA: Diagnosis present

## 2021-02-16 DIAGNOSIS — F0390 Unspecified dementia without behavioral disturbance: Secondary | ICD-10-CM | POA: Diagnosis present

## 2021-02-16 DIAGNOSIS — F03918 Unspecified dementia, unspecified severity, with other behavioral disturbance: Secondary | ICD-10-CM | POA: Diagnosis present

## 2021-02-16 DIAGNOSIS — R188 Other ascites: Secondary | ICD-10-CM

## 2021-02-16 DIAGNOSIS — R748 Abnormal levels of other serum enzymes: Secondary | ICD-10-CM | POA: Diagnosis not present

## 2021-02-16 DIAGNOSIS — Z539 Procedure and treatment not carried out, unspecified reason: Secondary | ICD-10-CM | POA: Diagnosis not present

## 2021-02-16 DIAGNOSIS — K8063 Calculus of gallbladder and bile duct with acute cholecystitis with obstruction: Secondary | ICD-10-CM | POA: Diagnosis present

## 2021-02-16 DIAGNOSIS — K810 Acute cholecystitis: Secondary | ICD-10-CM

## 2021-02-16 DIAGNOSIS — D539 Nutritional anemia, unspecified: Secondary | ICD-10-CM | POA: Diagnosis not present

## 2021-02-16 DIAGNOSIS — Z9841 Cataract extraction status, right eye: Secondary | ICD-10-CM | POA: Diagnosis not present

## 2021-02-16 DIAGNOSIS — E876 Hypokalemia: Secondary | ICD-10-CM | POA: Diagnosis present

## 2021-02-16 DIAGNOSIS — Z978 Presence of other specified devices: Secondary | ICD-10-CM | POA: Diagnosis not present

## 2021-02-16 DIAGNOSIS — G8929 Other chronic pain: Secondary | ICD-10-CM | POA: Diagnosis not present

## 2021-02-16 DIAGNOSIS — Z8249 Family history of ischemic heart disease and other diseases of the circulatory system: Secondary | ICD-10-CM

## 2021-02-16 DIAGNOSIS — K5710 Diverticulosis of small intestine without perforation or abscess without bleeding: Secondary | ICD-10-CM | POA: Diagnosis present

## 2021-02-16 DIAGNOSIS — K8066 Calculus of gallbladder and bile duct with acute and chronic cholecystitis without obstruction: Secondary | ICD-10-CM | POA: Diagnosis not present

## 2021-02-16 DIAGNOSIS — D75839 Thrombocytosis, unspecified: Secondary | ICD-10-CM | POA: Diagnosis not present

## 2021-02-16 DIAGNOSIS — D72829 Elevated white blood cell count, unspecified: Secondary | ICD-10-CM | POA: Diagnosis not present

## 2021-02-16 DIAGNOSIS — K8012 Calculus of gallbladder with acute and chronic cholecystitis without obstruction: Secondary | ICD-10-CM | POA: Diagnosis not present

## 2021-02-16 DIAGNOSIS — H9190 Unspecified hearing loss, unspecified ear: Secondary | ICD-10-CM | POA: Diagnosis present

## 2021-02-16 DIAGNOSIS — D519 Vitamin B12 deficiency anemia, unspecified: Secondary | ICD-10-CM | POA: Diagnosis present

## 2021-02-16 DIAGNOSIS — Z20822 Contact with and (suspected) exposure to covid-19: Secondary | ICD-10-CM | POA: Diagnosis present

## 2021-02-16 DIAGNOSIS — K831 Obstruction of bile duct: Secondary | ICD-10-CM

## 2021-02-16 DIAGNOSIS — R109 Unspecified abdominal pain: Secondary | ICD-10-CM | POA: Diagnosis not present

## 2021-02-16 DIAGNOSIS — K8043 Calculus of bile duct with acute cholecystitis with obstruction: Secondary | ICD-10-CM | POA: Diagnosis not present

## 2021-02-16 DIAGNOSIS — Z419 Encounter for procedure for purposes other than remedying health state, unspecified: Secondary | ICD-10-CM

## 2021-02-16 DIAGNOSIS — K6811 Postprocedural retroperitoneal abscess: Secondary | ICD-10-CM | POA: Diagnosis not present

## 2021-02-16 DIAGNOSIS — I700 Atherosclerosis of aorta: Secondary | ICD-10-CM | POA: Diagnosis present

## 2021-02-16 DIAGNOSIS — T8143XA Infection following a procedure, organ and space surgical site, initial encounter: Secondary | ICD-10-CM | POA: Diagnosis not present

## 2021-02-16 DIAGNOSIS — D518 Other vitamin B12 deficiency anemias: Secondary | ICD-10-CM | POA: Diagnosis not present

## 2021-02-16 DIAGNOSIS — R933 Abnormal findings on diagnostic imaging of other parts of digestive tract: Secondary | ICD-10-CM | POA: Diagnosis not present

## 2021-02-16 DIAGNOSIS — K828 Other specified diseases of gallbladder: Secondary | ICD-10-CM | POA: Diagnosis not present

## 2021-02-16 DIAGNOSIS — Z9049 Acquired absence of other specified parts of digestive tract: Secondary | ICD-10-CM | POA: Diagnosis not present

## 2021-02-16 DIAGNOSIS — K838 Other specified diseases of biliary tract: Secondary | ICD-10-CM | POA: Diagnosis not present

## 2021-02-16 LAB — CBC WITH DIFFERENTIAL/PLATELET
Abs Immature Granulocytes: 0.07 10*3/uL (ref 0.00–0.07)
Basophils Absolute: 0.1 10*3/uL (ref 0.0–0.1)
Basophils Relative: 0 %
Eosinophils Absolute: 0 10*3/uL (ref 0.0–0.5)
Eosinophils Relative: 0 %
HCT: 45 % (ref 36.0–46.0)
Hemoglobin: 14.6 g/dL (ref 12.0–15.0)
Immature Granulocytes: 1 %
Lymphocytes Relative: 10 %
Lymphs Abs: 1.3 10*3/uL (ref 0.7–4.0)
MCH: 29.3 pg (ref 26.0–34.0)
MCHC: 32.4 g/dL (ref 30.0–36.0)
MCV: 90.4 fL (ref 80.0–100.0)
Monocytes Absolute: 1.1 10*3/uL — ABNORMAL HIGH (ref 0.1–1.0)
Monocytes Relative: 9 %
Neutro Abs: 9.8 10*3/uL — ABNORMAL HIGH (ref 1.7–7.7)
Neutrophils Relative %: 80 %
Platelets: 279 10*3/uL (ref 150–400)
RBC: 4.98 MIL/uL (ref 3.87–5.11)
RDW: 13.7 % (ref 11.5–15.5)
WBC: 12.3 10*3/uL — ABNORMAL HIGH (ref 4.0–10.5)
nRBC: 0 % (ref 0.0–0.2)

## 2021-02-16 LAB — COMPREHENSIVE METABOLIC PANEL
ALT: 108 U/L — ABNORMAL HIGH (ref 0–44)
AST: 277 U/L — ABNORMAL HIGH (ref 15–41)
Albumin: 3.3 g/dL — ABNORMAL LOW (ref 3.5–5.0)
Alkaline Phosphatase: 223 U/L — ABNORMAL HIGH (ref 38–126)
Anion gap: 8 (ref 5–15)
BUN: 21 mg/dL (ref 8–23)
CO2: 27 mmol/L (ref 22–32)
Calcium: 9.7 mg/dL (ref 8.9–10.3)
Chloride: 102 mmol/L (ref 98–111)
Creatinine, Ser: 0.85 mg/dL (ref 0.44–1.00)
GFR, Estimated: >60 mL/min (ref >60)
Glucose, Bld: 108 mg/dL — ABNORMAL HIGH (ref 70–99)
Potassium: 4.1 mmol/L (ref 3.5–5.1)
Sodium: 137 mmol/L (ref 135–145)
Total Bilirubin: 1.5 mg/dL — ABNORMAL HIGH (ref 0.3–1.2)
Total Protein: 7.2 g/dL (ref 6.5–8.1)

## 2021-02-16 LAB — RESP PANEL BY RT-PCR (FLU A&B, COVID) ARPGX2
Influenza A by PCR: NEGATIVE
Influenza B by PCR: NEGATIVE
SARS Coronavirus 2 by RT PCR: NEGATIVE

## 2021-02-16 LAB — URINALYSIS, ROUTINE W REFLEX MICROSCOPIC
Glucose, UA: NEGATIVE mg/dL
Ketones, ur: NEGATIVE mg/dL
Nitrite: NEGATIVE
Protein, ur: 30 mg/dL — AB
Specific Gravity, Urine: 1.02 (ref 1.005–1.030)
pH: 6 (ref 5.0–8.0)

## 2021-02-16 LAB — URINALYSIS, MICROSCOPIC (REFLEX)

## 2021-02-16 LAB — LIPASE, BLOOD: Lipase: 31 U/L (ref 11–51)

## 2021-02-16 LAB — PROTIME-INR
INR: 1.1 (ref 0.8–1.2)
Prothrombin Time: 13.8 seconds (ref 11.4–15.2)

## 2021-02-16 MED ORDER — ONDANSETRON HCL 4 MG/2ML IJ SOLN: 4.0000 mg | Freq: Four times a day (QID) | INTRAMUSCULAR | Status: DC | PRN

## 2021-02-16 MED ORDER — SODIUM CHLORIDE 0.9 % IV SOLN: 250.0000 mL | INTRAVENOUS | Status: DC | PRN

## 2021-02-16 MED ORDER — SODIUM CHLORIDE 0.9% FLUSH
3.0000 mL | Freq: Two times a day (BID) | INTRAVENOUS | Status: DC
  Administered 2021-02-17 – 2021-02-28 (×18): 3 mL via INTRAVENOUS

## 2021-02-16 MED ORDER — SODIUM CHLORIDE 0.9% FLUSH: 3.0000 mL | INTRAVENOUS | Status: DC | PRN

## 2021-02-16 MED ORDER — PIPERACILLIN-TAZOBACTAM 3.375 G IVPB 30 MIN
3.3750 g | Freq: Once | INTRAVENOUS | Status: AC
  Administered 2021-02-16: 21:00:00 3.375 g via INTRAVENOUS
  Filled 2021-02-16: qty 50

## 2021-02-16 MED ORDER — IOHEXOL 300 MG/ML  SOLN
100.0000 mL | Freq: Once | INTRAMUSCULAR | Status: AC | PRN
  Administered 2021-02-16: 22:00:00 100 mL via INTRAVENOUS

## 2021-02-16 MED ORDER — IBUPROFEN 200 MG PO TABS
400.0000 mg | ORAL_TABLET | Freq: Four times a day (QID) | ORAL | Status: DC | PRN
  Administered 2021-02-18: 400 mg via ORAL
  Filled 2021-02-16: qty 2

## 2021-02-16 MED ORDER — ONDANSETRON HCL 4 MG PO TABS: 4.0000 mg | ORAL_TABLET | Freq: Four times a day (QID) | ORAL | Status: DC | PRN

## 2021-02-16 NOTE — ED Notes (Signed)
Patient transported to CT 

## 2021-02-16 NOTE — ED Notes (Signed)
When arriving back from [place] daughter called out because has dementia and is walking around the room and has taken everything off and is in her regular clothing she is attempting to pull armband off

## 2021-02-16 NOTE — Consult Note (Signed)
Consulting Physician: Hyman Hopes Jeree Delcid  Referring Provider: Dr. Audley Hose   Chief Complaint: Abdominal pain  Reason for Consult: Cholecystitis   Subjective   HPI: Ebony Pierce is an 85 y.o. female who is here for abdominal pain.    The patient is demented and [place] speaking and unable to provide a history.  She has been having right upper quadrant abdominal pain has been into the ER twice now for this.  She forgets that she is having pain and so her daughter has to push on her abdomen to remind her.  She is not cooperative and she is very stubborn according to her daughter.   Past Medical History:  Diagnosis Date   Age-related hearing loss 10/19/2016   Cataract    s/p R cataract surgery   Dementia with behavioral disturbance 07/02/2015   [facility] 10/30 (Administered with assistance of [place] interpreter in exam room (11/12/16)   Reduced vision 09/16/2016   Weight loss, non-intentional 04/14/2018    Past Surgical History:  Procedure Laterality Date   CATARACT EXTRACTION Right 2012    Family History  Problem Relation Age of Onset   Hypertension Mother     Social:  reports that she has never smoked. She has never used smokeless tobacco. She reports that she does not drink alcohol and does not use drugs.  Allergies: No Known Allergies  Medications: Current Outpatient Medications  Medication Instructions   donepezil (ARICEPT) 10 mg, Oral, Daily at bedtime   Melatonin 5-10 mg, Oral, Daily at bedtime   MULTIPLE VITAMIN PO Oral, Daily    ROS - all of the below systems have been reviewed with the patient and positives are indicated with bold text General: chills, fever or night sweats Eyes: blurry vision or double vision ENT: epistaxis or sore throat Allergy/Immunology: itchy/watery eyes or nasal congestion Hematologic/Lymphatic: bleeding problems, blood clots or swollen lymph nodes Endocrine: temperature intolerance or unexpected weight changes Breast: new or changing  breast lumps or nipple discharge Resp: cough, shortness of breath, or wheezing CV: chest pain or dyspnea on exertion GI: as per HPI GU: dysuria, trouble voiding, or hematuria MSK: joint pain or joint stiffness Neuro: TIA or stroke symptoms Derm: pruritus and skin lesion changes Psych: anxiety and depression  Objective   PE Blood pressure 111/67, pulse 81, temperature 99.2 F (37.3 C), temperature source Oral, resp. rate 18, SpO2 100 %. Constitutional: NAD; no deformities Eyes: Moist conjunctiva; no lid lag; anicteric; PERRL Neck: Trachea midline; no thyromegaly Lungs: Normal respiratory effort; no tactile fremitus CV: RRR; no palpable thrills; no pitting edema GI: Abd soft, tender to palpation the right upper quadrant.; no palpable hepatosplenomegaly MSK: Normal range of motion of extremities; no clubbing/cyanosis Psychiatric: Demented, stubborn Lymphatic: No palpable cervical or axillary lymphadenopathy  Results for orders placed or performed during the hospital encounter of 02/16/21 (from the past 24 hour(s))  CBC with Differential     Status: Abnormal   Collection Time: 02/16/21  1:28 PM  Result Value Ref Range   WBC 12.3 (H) 4.0 - 10.5 K/uL   RBC 4.98 3.87 - 5.11 MIL/uL   Hemoglobin 14.6 12.0 - 15.0 g/dL   HCT 61.9 50.9 - 32.6 %   MCV 90.4 80.0 - 100.0 fL   MCH 29.3 26.0 - 34.0 pg   MCHC 32.4 30.0 - 36.0 g/dL   RDW 71.2 45.8 - 09.9 %   Platelets 279 150 - 400 K/uL   nRBC 0.0 0.0 - 0.2 %   Neutrophils Relative %  80 %   Neutro Abs 9.8 (H) 1.7 - 7.7 K/uL   Lymphocytes Relative 10 %   Lymphs Abs 1.3 0.7 - 4.0 K/uL   Monocytes Relative 9 %   Monocytes Absolute 1.1 (H) 0.1 - 1.0 K/uL   Eosinophils Relative 0 %   Eosinophils Absolute 0.0 0.0 - 0.5 K/uL   Basophils Relative 0 %   Basophils Absolute 0.1 0.0 - 0.1 K/uL   Immature Granulocytes 1 %   Abs Immature Granulocytes 0.07 0.00 - 0.07 K/uL  Comprehensive metabolic panel     Status: Abnormal   Collection Time:  02/16/21  1:28 PM  Result Value Ref Range   Sodium 137 135 - 145 mmol/L   Potassium 4.1 3.5 - 5.1 mmol/L   Chloride 102 98 - 111 mmol/L   CO2 27 22 - 32 mmol/L   Glucose, Bld 108 (H) 70 - 99 mg/dL   BUN 21 8 - 23 mg/dL   Creatinine, Ser 0.85 0.44 - 1.00 mg/dL   Calcium 9.7 8.9 - 10.3 mg/dL   Total Protein 7.2 6.5 - 8.1 g/dL   Albumin 3.3 (L) 3.5 - 5.0 g/dL   AST 277 (H) 15 - 41 U/L   ALT 108 (H) 0 - 44 U/L   Alkaline Phosphatase 223 (H) 38 - 126 U/L   Total Bilirubin 1.5 (H) 0.3 - 1.2 mg/dL   GFR, Estimated >60 >60 mL/min   Anion gap 8 5 - 15  Lipase, blood     Status: None   Collection Time: 02/16/21  1:28 PM  Result Value Ref Range   Lipase 31 11 - 51 U/L  Urinalysis, Routine w reflex microscopic     Status: Abnormal   Collection Time: 02/16/21  1:28 PM  Result Value Ref Range   Color, Urine YELLOW YELLOW   APPearance CLEAR CLEAR   Specific Gravity, Urine 1.020 1.005 - 1.030   pH 6.0 5.0 - 8.0   Glucose, UA NEGATIVE NEGATIVE mg/dL   Hgb urine dipstick SMALL (A) NEGATIVE   Bilirubin Urine SMALL (A) NEGATIVE   Ketones, ur NEGATIVE NEGATIVE mg/dL   Protein, ur 30 (A) NEGATIVE mg/dL   Nitrite NEGATIVE NEGATIVE   Leukocytes,Ua TRACE (A) NEGATIVE  Urinalysis, Microscopic (reflex)     Status: Abnormal   Collection Time: 02/16/21  1:28 PM  Result Value Ref Range   RBC / HPF 0-5 0 - 5 RBC/hpf   WBC, UA 0-5 0 - 5 WBC/hpf   Bacteria, UA RARE (A) NONE SEEN   Squamous Epithelial / LPF 0-5 0 - 5   Mucus PRESENT      Imaging Orders         CT ABDOMEN PELVIS W CONTRAST         US Abdomen Limited RUQ (LIVER/GB)      Assessment and Plan   Ebony Pierce is an 85 y.o. female with right upper quadrant abdominal pain, dated LFTs and a thickened gallbladder wall gallbladder on right upper quadrant ultrasound.  I recommend admission to the medicine service and gastroenterology consult.  She likely needs an MRCP, or ERCP as she may not cooperate with MRCP, for evaluation of her  common bile duct for choledocholithiasis or other cause of her elevated LFTs like cholangiocarcinoma.  Surgery team will follow closely.   Felicie Morn, MD  [facility] Use [email or web address] to contact on call provider

## 2021-02-16 NOTE — H&P (Addendum)
History and Physical    Ebony Pierce BDZ:[id number] DOB: [birth year] DOA: 02/16/2021  PCP: Derrel Nip, MD Consultants:   Patient coming from:  Home - lives with daughter and son in law  Chief Complaint: abdominal pain   HPI: Ebony Pierce is a 85 y.o. female with medical history significant of dementia, B12 deficiency who presented to ED with RUQ pain. Seen in ED on 11/24 with similar complaints x 1 day. Lab work up normal then and attempted CT abdo, but she pulled out her line and they left since she no longer had any pain. She returns today. History is from her daughter. She states pain started in her RUQ on Thursday and was rated as a 10/10. Pain was sharp and constant. Daughter states on Thursday pain may have radiated, but it was hard to tell. The went to ED and she felt better with morphine and went home.  She has had no N/V/D. She has been more fatigued since going home on Thursday. She also has had poor PO intake. Today around 11am she had recurrent 10/10 pain in her RUQ  after eating rice soup. No N/V/D today either. Seems food makes the pain worse after she eats. Its hard to know somethings with her dementia. Poor PO intake x 1 day.   No fever/chills at home. No chest pain/palpitations. No cough, no shortness of breath today, but has said at times she is short of breath, no N/V/D, no leg swelling, no dysuria. She is at her baseline mentation.   She is able to get dressed on her own and use bathroom on her own. She does have assist with showers. She is able to eat on  her own. She knows her family members, does get confused on location.   No hx of known abdominal surgeries. No known cancers. Does not drink alcohol or smoke. No tylenol use.   ED Course: vitals: temperature: 99.2, bp: 111/67, HR; 81, RR: 18, oxygen: 100% on room air.  Pertinent labs: WBC: 12.3, AST: 277, ALT: 108, Alkaline phosphatase: 223, total bilirubin: 1.5 (normal 3 days ago)  RUQ [place]: abnormal  gallbladder. Irregularly thickened wall with edema, measuring tup to 42mm in thickness, associated with small amount of pericholecystic fluid as well as + murphy sign. No shadowing stones noted. Dilated CBD to 1cm.  In ED general surgery consulted. Patient given zosyn. CT abdo/pelvis ordered and [facility] was asked to admit.   Review of Systems: As per HPI; otherwise review of systems reviewed and negative.   Ambulatory Status:  Ambulates without assistance   Past Medical History:  Diagnosis Date   Age-related hearing loss 10/19/2016   Cataract    s/p R cataract surgery   Dementia with behavioral disturbance 07/02/2015   [facility] 10/30 (Administered with assistance of [place] interpreter in exam room (11/12/16)   Reduced vision 09/16/2016   Weight loss, non-intentional 04/14/2018    Past Surgical History:  Procedure Laterality Date   CATARACT EXTRACTION Right 2012    Social History   Socioeconomic History   Marital status: Widowed    Spouse name: Not on file   Number of children: Not on file   Years of education: 12   Highest education level: Not on file  Occupational History   Not on file  Tobacco Use   Smoking status: Never   Smokeless tobacco: Never  Vaping Use   Vaping Use: Never used  Substance and Sexual Activity   Alcohol use: No    Alcohol/week: 0.0 standard  drinks   Drug use: No   Sexual activity: Not Currently  Other Topics Concern   Not on file  Social History Narrative   Lives at home with daughter and son-in-law.    Enjoys Retail banker and walking     Primary family support persons is daughter who is also her paid Merchandiser, retail.    Transportation to appointments provided by daughter [name].   Receives the following community support services SSI, food stamps and CAPS services.       Social Determinants of Health   Financial Resource Strain: Low Risk    Difficulty of Paying Living Expenses: Not hard at all  Food Insecurity: No Food Insecurity   Worried About Brewing technologist in the Last Year: Never true   Ran Out of Food in the Last Year: Never true  Transportation Needs: No Transportation Needs   Lack of Transportation (Medical): No   Lack of Transportation (Non-Medical): No  Physical Activity: Not on file  Stress: Not on file  Social Connections: Not on file  Intimate Partner Violence: Not on file    No Known Allergies  Family History  Problem Relation Age of Onset   Hypertension Mother     Prior to Admission medications   Medication Sig Start Date End Date Taking? Authorizing Provider  donepezil (ARICEPT) 10 MG tablet Take 1 tablet (10 mg total) by mouth at bedtime. Patient not taking: No sig reported 05/13/20   Derrel Nip, MD  Melatonin 5 MG CAPS Take 1-2 capsules (5-10 mg total) by mouth at bedtime. 08/13/20   Derrel Nip, MD  MULTIPLE VITAMIN PO Take by mouth daily.    [provider]    Physical Exam: Vitals:   02/16/21 1205  BP: 111/67  Pulse: 81  Resp: 18  Temp: 99.2 F (37.3 C)  TempSrc: Oral  SpO2: 100%     General:  Appears calm and comfortable and is in NAD Eyes:  PERRL, EOMI, normal lids, iris ENT:  grossly normal hearing, lips & tongue, mmm; appropriate dentition Neck:  no LAD, masses or thyromegaly; no carotid bruits Cardiovascular:  RRR, no m/r/g. No LE edema.  Respiratory:   CTA bilaterally with no wheezes/rales/rhonchi.  Normal respiratory effort. Abdomen:  soft, +murphy sign, no rebound or guarding, +BS Back:   normal alignment, no CVAT Skin:  no rash or induration seen on limited exam Musculoskeletal:  grossly normal tone BUE/BLE, good ROM, no bony abnormality Lower extremity:  No LE edema.  Limited foot exam with no ulcerations.  2+ distal pulses. Psychiatric:  grossly normal mood and affect, speech fluent and appropriate, oriented to self, daughter. At baseline.  Neurologic:  CN 2-12 grossly intact, moves all extremities in coordinated fashion, sensation intact    Radiological Exams on  Admission: Independently reviewed - see discussion in A/P where applicable  US Abdomen Limited RUQ (LIVER/GB)  Result Date: 02/16/2021 CLINICAL DATA:  Right upper quadrant pain. EXAM: ULTRASOUND ABDOMEN LIMITED RIGHT UPPER QUADRANT COMPARISON:  None. FINDINGS: Gallbladder: Mild to moderately distended. Wall is irregularly thickened linear areas wall edema. There is also pericholecystic fluid. Small amount of dependent sludge. One echogenic focus noted in the dependent gallbladder consistent with a polyp or nonshadowing stone, 5 mm in size. Patient is tender to transducer pressure over the gallbladder. Common bile duct: Diameter: 1 cm. No visualized duct stone. Most distal aspect of the duct was obscured by bowel gas. Liver: No focal lesion identified. Within normal limits in parenchymal echogenicity. Portal vein  is patent on color Doppler imaging with normal direction of blood flow towards the liver. Other: None. IMPRESSION: 1. Abnormal gallbladder. Wall is irregularly thickened with wall edema, measuring up to 9 mm in thickness, associated with a small amount of pericholecystic fluid as well as a positive sonographic Murphy's sign. However, no shadowing stones are noted. Findings support acute cholecystitis in the proper clinical setting. Findings could be further assessed with a nuclear medicine HIDA scan. 2. Dilated common bile duct to 1 cm.  No visualized duct stone. Electronically Signed   By: Amie Portland M.D.   On: 02/16/2021 15:51    EKG: Independently reviewed from 11/24:   NSR with rate 64 with incomplete RBBB and LAFB ; nonspecific ST changes with no evidence of acute ischemia   Labs on Admission: I have personally reviewed the available labs and imaging studies at the time of the admission.  Pertinent labs:  WBC: 12.3,  AST: 277,  ALT: 108,  Alkaline phosphatase: 223,  total bilirubin: 1.5 (normal 3 days ago)   Assessment/Plan Principal Problem:   RUQ pain in setting of  transaminitis and abnormal gallbladder on [place] 85 year old female presenting with RUQ pain and transaminitis with abnormal gallbladder findings on liver US.  -admit to med-surg -general surgery and GI have been consulted. Need to rule out stone or other cause of her elevated LFTs, will not tolerate MRCP with advanced dementia. Discussed w/ GI (Dr. Christella Hartigan) and plan for HIDA scan. Will make NPO at midnight for this. No morphine for 6 hours prior to this as well.  -ibuprofen for pain/fever, may need escalate pain meds, but can not have narcotics 6 hours prior to hida.  -zofran for N/V -continue zosyn to cover for possible infection, no stone seen on [place].   -hepatitis panel pending  -if no obstruction stone, concern for malignancy/auto immune.   Active Problems:  Leukocytosis Mildly elevated WBC, no other signs of sepsis. Will continue zosyn to cover for possible infection until stone can be ruled out.  Trend and follow fever curve.     Dementia with behavioral disturbance -delirium precautions and will have daughter stay with her.  -she is wandering and needs redirection from her daughter     B12 deficiency anemia  Recent B12 checked 10/01/20 and wnl.   MAR will need to be reconciled tomorrow.    There is no height or weight on file to calculate BMI.    Level of care: Med-Surg DVT prophylaxis:  TED hose  Code Status:  Full - confirmed with family Family Communication: daughter at bedside: Ebony Pierce  Disposition Plan:  The patient is from: home  Anticipated d/c is to: home   Patient placed in observation as anticipate less than 2 midnight stay. Requires hospitalization for imaging, labs, pain control for abnormal LFTs and abnormal imaging of gallbladder.     Patient is currently: stable  Consults called: general surgery and GI  Admission status:  observation    Orland Mustard MD [facility]   How to contact the [facility] Attending or Consulting provider 7A - 7P or covering  provider during after hours 7P -7A, for this patient?  Check the care team in [facility] and look for a) attending/consulting [facility] provider listed and b) the [facility] team listed Log into [email or web address] and use [place]'s universal password to access. If you do not have the password, please contact the hospital operator. Locate the [facility] provider you are looking for under [facility] and page  to a number that you can be directly reached. If you still have difficulty reaching the provider, please page the [facility] (Director on Call) for the Hospitalists listed on amion for assistance.   02/16/2021, 6:35 PM

## 2021-02-16 NOTE — ED Notes (Signed)
Assumed care of pt at this time.

## 2021-02-16 NOTE — ED Provider Notes (Signed)
Emergency Medicine Provider Triage Evaluation Note  Ebony Pierce , a 85 y.o. female  was evaluated in triage.  Pt complains of abd pain.  Review of Systems  Positive: Abd pain, decreased appetite Negative: Fever, cough, dysuria  Physical Exam  BP 111/67   Pulse 81   Temp 99.2 F (37.3 C) (Oral)   Resp 18   SpO2 100%  Gen:   Awake, no distress   Resp:  Normal effort  MSK:   Moves extremities without difficulty  Other:    Medical Decision Making  Medically screening exam initiated at 12:58 PM.  Appropriate orders placed.  Ebony Pierce was informed that the remainder of the evaluation will be completed by another provider, this initial triage assessment does not replace that evaluation, and the importance of remaining in the ED until their evaluation is complete.  Pt was last seen in the ER on 11/24 for abd pain, worse with eating.  Left AMA, but returns today as pain has been reoccuring and she is eating less.  Pt is demented.  Mild lower abd tenderness on exam. Suspect diverticular disease or appendicitis.  Encourage CT scan   Ebony Pierce, Ebony Pierce 02/16/21 1329    Ebony Sleeper, MD 02/16/21 1357

## 2021-02-16 NOTE — ED Notes (Signed)
Pt is refusing having her vitals taken at this time

## 2021-02-16 NOTE — ED Triage Notes (Signed)
C/o RLQ pain since 11am.  Denies nausea, vomiting, diarrhea, constipation, and urinary complaints.  States seen in ED Thursday for same and was feeling better until today.  Tender to palpation.

## 2021-02-17 ENCOUNTER — Observation Stay (HOSPITAL_COMMUNITY): Payer: Medicare Other

## 2021-02-17 DIAGNOSIS — R748 Abnormal levels of other serum enzymes: Secondary | ICD-10-CM

## 2021-02-17 DIAGNOSIS — Q453 Other congenital malformations of pancreas and pancreatic duct: Secondary | ICD-10-CM | POA: Diagnosis not present

## 2021-02-17 DIAGNOSIS — K8063 Calculus of gallbladder and bile duct with acute cholecystitis with obstruction: Secondary | ICD-10-CM | POA: Diagnosis present

## 2021-02-17 DIAGNOSIS — R932 Abnormal findings on diagnostic imaging of liver and biliary tract: Secondary | ICD-10-CM

## 2021-02-17 DIAGNOSIS — F03918 Unspecified dementia, unspecified severity, with other behavioral disturbance: Secondary | ICD-10-CM

## 2021-02-17 DIAGNOSIS — K832 Perforation of bile duct: Secondary | ICD-10-CM | POA: Diagnosis not present

## 2021-02-17 DIAGNOSIS — Z9841 Cataract extraction status, right eye: Secondary | ICD-10-CM | POA: Diagnosis not present

## 2021-02-17 DIAGNOSIS — R188 Other ascites: Secondary | ICD-10-CM | POA: Diagnosis not present

## 2021-02-17 DIAGNOSIS — R1011 Right upper quadrant pain: Secondary | ICD-10-CM | POA: Diagnosis present

## 2021-02-17 DIAGNOSIS — K838 Other specified diseases of biliary tract: Secondary | ICD-10-CM | POA: Diagnosis not present

## 2021-02-17 DIAGNOSIS — Z79899 Other long term (current) drug therapy: Secondary | ICD-10-CM | POA: Diagnosis not present

## 2021-02-17 DIAGNOSIS — R7989 Other specified abnormal findings of blood chemistry: Secondary | ICD-10-CM | POA: Diagnosis present

## 2021-02-17 DIAGNOSIS — D518 Other vitamin B12 deficiency anemias: Secondary | ICD-10-CM | POA: Diagnosis not present

## 2021-02-17 DIAGNOSIS — I700 Atherosclerosis of aorta: Secondary | ICD-10-CM | POA: Diagnosis present

## 2021-02-17 DIAGNOSIS — K5710 Diverticulosis of small intestine without perforation or abscess without bleeding: Secondary | ICD-10-CM | POA: Diagnosis present

## 2021-02-17 DIAGNOSIS — K82A1 Gangrene of gallbladder in cholecystitis: Secondary | ICD-10-CM | POA: Diagnosis present

## 2021-02-17 DIAGNOSIS — K810 Acute cholecystitis: Secondary | ICD-10-CM | POA: Diagnosis not present

## 2021-02-17 DIAGNOSIS — D75839 Thrombocytosis, unspecified: Secondary | ICD-10-CM | POA: Diagnosis not present

## 2021-02-17 DIAGNOSIS — Z20822 Contact with and (suspected) exposure to covid-19: Secondary | ICD-10-CM | POA: Diagnosis present

## 2021-02-17 DIAGNOSIS — K760 Fatty (change of) liver, not elsewhere classified: Secondary | ICD-10-CM | POA: Diagnosis present

## 2021-02-17 DIAGNOSIS — E876 Hypokalemia: Secondary | ICD-10-CM | POA: Diagnosis present

## 2021-02-17 DIAGNOSIS — Z8249 Family history of ischemic heart disease and other diseases of the circulatory system: Secondary | ICD-10-CM | POA: Diagnosis not present

## 2021-02-17 DIAGNOSIS — K831 Obstruction of bile duct: Secondary | ICD-10-CM | POA: Diagnosis not present

## 2021-02-17 DIAGNOSIS — Z539 Procedure and treatment not carried out, unspecified reason: Secondary | ICD-10-CM | POA: Diagnosis not present

## 2021-02-17 DIAGNOSIS — D519 Vitamin B12 deficiency anemia, unspecified: Secondary | ICD-10-CM | POA: Diagnosis present

## 2021-02-17 DIAGNOSIS — H9190 Unspecified hearing loss, unspecified ear: Secondary | ICD-10-CM | POA: Diagnosis present

## 2021-02-17 DIAGNOSIS — R933 Abnormal findings on diagnostic imaging of other parts of digestive tract: Secondary | ICD-10-CM | POA: Diagnosis not present

## 2021-02-17 LAB — CBC
HCT: 40.6 % (ref 36.0–46.0)
Hemoglobin: 13.6 g/dL (ref 12.0–15.0)
MCH: 29.2 pg (ref 26.0–34.0)
MCHC: 33.5 g/dL (ref 30.0–36.0)
MCV: 87.3 fL (ref 80.0–100.0)
Platelets: 286 10*3/uL (ref 150–400)
RBC: 4.65 MIL/uL (ref 3.87–5.11)
RDW: 13.7 % (ref 11.5–15.5)
WBC: 7.3 10*3/uL (ref 4.0–10.5)
nRBC: 0 % (ref 0.0–0.2)

## 2021-02-17 LAB — COMPREHENSIVE METABOLIC PANEL
ALT: 95 U/L — ABNORMAL HIGH (ref 0–44)
AST: 92 U/L — ABNORMAL HIGH (ref 15–41)
Albumin: 2.9 g/dL — ABNORMAL LOW (ref 3.5–5.0)
Alkaline Phosphatase: 179 U/L — ABNORMAL HIGH (ref 38–126)
Anion gap: 8 (ref 5–15)
BUN: 14 mg/dL (ref 8–23)
CO2: 25 mmol/L (ref 22–32)
Calcium: 9 mg/dL (ref 8.9–10.3)
Chloride: 100 mmol/L (ref 98–111)
Creatinine, Ser: 0.88 mg/dL (ref 0.44–1.00)
GFR, Estimated: >60 mL/min (ref >60)
Glucose, Bld: 113 mg/dL — ABNORMAL HIGH (ref 70–99)
Potassium: 3.3 mmol/L — ABNORMAL LOW (ref 3.5–5.1)
Sodium: 133 mmol/L — ABNORMAL LOW (ref 135–145)
Total Bilirubin: 0.8 mg/dL (ref 0.3–1.2)
Total Protein: 6.5 g/dL (ref 6.5–8.1)

## 2021-02-17 LAB — HEPATITIS PANEL, ACUTE
HCV Ab: NONREACTIVE
Hep A IgM: NONREACTIVE
Hep B C IgM: NONREACTIVE
Hepatitis B Surface Ag: NONREACTIVE

## 2021-02-17 MED ORDER — PIPERACILLIN-TAZOBACTAM 3.375 G IVPB
3.3750 g | Freq: Three times a day (TID) | INTRAVENOUS | Status: DC
Start: 2021-02-17 — End: 2021-02-24
  Administered 2021-02-17 – 2021-02-24 (×21): 3.375 g via INTRAVENOUS
  Filled 2021-02-17 (×20): qty 50

## 2021-02-17 MED ORDER — POTASSIUM CHLORIDE 10 MEQ/100ML IV SOLN: 10.0000 meq | Freq: Once | INTRAVENOUS | Status: DC

## 2021-02-17 MED ORDER — POTASSIUM CHLORIDE CRYS ER 10 MEQ PO TBCR
10.0000 meq | EXTENDED_RELEASE_TABLET | Freq: Once | ORAL | Status: AC
  Administered 2021-02-17: 21:00:00 10 meq via ORAL
  Filled 2021-02-17: qty 1

## 2021-02-17 MED ORDER — ENOXAPARIN SODIUM 40 MG/0.4ML IJ SOSY: 40.0000 mg | PREFILLED_SYRINGE | INTRAMUSCULAR | Status: DC

## 2021-02-17 MED ORDER — LORAZEPAM 2 MG/ML IJ SOLN
1.0000 mg | Freq: Once | INTRAMUSCULAR | Status: AC
  Administered 2021-02-17: 17:00:00 1 mg via INTRAVENOUS
  Filled 2021-02-17: qty 1

## 2021-02-17 MED ORDER — TRAZODONE HCL 50 MG PO TABS
50.0000 mg | ORAL_TABLET | Freq: Every evening | ORAL | Status: DC | PRN
  Administered 2021-02-18: 50 mg via ORAL
  Filled 2021-02-17 (×2): qty 1

## 2021-02-17 NOTE — Progress Notes (Signed)
Pt is still NPO  as of 10 pm last night awaiting MRI at this time.

## 2021-02-17 NOTE — Progress Notes (Signed)
Pt received  IV Ativan 1 mg for MRI

## 2021-02-17 NOTE — Consult Note (Addendum)
Attending physician's note   I have taken an interval history, reviewed the chart and examined the patient. I agree with the Advanced Practitioner's note, impression, and recommendations as outlined.   85 year old female with medical history as outlined below to include history of dementia, admitted with RUQ pain and cholecystitis.  History obtained via daughter at bedside.  Admission evaluation notable for the following:  - WBC 12.3 with otherwise normal CBC - AST/ALT 277/180, T bili 1.5, ALP 233 - Normal lipase - Normal/negative acute viral hepatitis panel - INR 1.1 - RUQ [place]: Thickened GB wall at 9 mm with pericholecystic fluid and sludge without cholelithiasis.  Positive Murphy sign.  CBD 1 cm without CDL - CT: GB wall thickening with 15 mm CBD without CDL  - Liver enzymes downtrending today with AST/ALT 90/95 and ALP 179 with normalization of T bili 0.8.  1) RUQ pain 2) Cholecystitis  3) Elevated liver enzymes 4) Dementia  - Liver enzymes improving and T bili normalized - Continue trending enzymes - Continue Zosyn - N.p.o. at [place] - Tentative plan for MRCP.  However, in light of patient's dementia, may only obtain suboptimal images.  I d/w surgical service- if equivocal (or negative) results, plan is for lap ccy w/ [place], DO, FACG [phone] office         [facility] Consult: 8:26 AM 02/17/2021  LOS: 0 days    Referring Provider: Dr Darrick Meigs  Primary Care Physician:  Gifford Shave, MD Primary Gastroenterologist:  unassigned.      Reason for Consultation:  elevated LFTs.     HPI: Ebony Pierce is a 85 y.o. female.  Originally from [place] but moved to [place] around 2009.  PMH dementia.  Charting in 2017 mentions she has had EGD but not colonoscopy.  No records of colonoscopy since then.  11/24 ER visit.  Complaint of shortness of breath,  nausea, R abdominal pain, diaphoresis occurring after eating.  LFTs and other labs unremarkable.  Unable to perform CT after patient pulled out her IV.  Family did not want IV replaced as her symptoms had improved and they ended up taking her home [place]. Family/patient return to ED early yesterday morning with RUQ abdominal pain.  T bili 1.5 ..  0.8.  Alk phos 223 .Marland Kitchen. 179.  AST/ALT 277/108 ... 92/95.  Lipase 31 Acute hepatitis serologies pending. Na 133.  Potassium 3.3.  Normal BUN and creatinine. WBC 12.3 ... 7.3.  Hgb 13.6.  PT/INR normal. Limited abdominal ultrasound shows GB wall thickening, small volume pericholecystic fluid, positive sonographic Murphy's sign.  Findings support acute cholecystitis.  CBD 1 cm. CTAP w contrast: Marked gallbladder wall thickening with areas of mucosal irregularity.  Findings suspicious for cholecystitis and possibly gangrenous cholecystitis.  Small bowel diverticulum.  CBD 1.5 mm.  Fatty liver.  Unremarkable pancreas.  Right sided, fat-containing  Bochdalek hernia.  Nonspecific prominent pelvic vessels.  Aortic atherosclerosis. Unable to obtain MRCP due to dementia preventing her from laying still.  General surgery saw the patient yesterday evening.  They indicated need for MRCP but this was unobtainable.  Also suggested possible ERCP to evaluate for choledocholithiasis.  HIDA scan is ordered though not clear patient will be able to cooperate for this.  Zosyn initiated.  Today there is no complaint of abdominal pain, just right upper quadrant TTP.  No nausea or vomiting..  Though anorexic for the last few days, she is hungry this morning and is NPO.  Past Medical History:  Diagnosis Date   Age-related hearing loss 10/19/2016   Cataract    s/p R cataract surgery   Dementia with behavioral disturbance 07/02/2015   [facility]CA 10/30 (Administered with assistance of [place] interpreter in exam room (11/12/16)   Reduced vision 09/16/2016   Weight loss,  non-intentional 04/14/2018    Past Surgical History:  Procedure Laterality Date   CATARACT EXTRACTION Right 2012    Prior to Admission medications   Medication Sig Start Date End Date Taking? Authorizing Provider  Famotidine-Ca Carb-Mag Hydrox (PEPCID COMPLETE PO) Take 1 tablet by mouth daily as needed (stomach).   Yes [provider]  Melatonin 5 MG CAPS Take 1-2 capsules (5-10 mg total) by mouth at bedtime. Patient taking differently: Take 10 capsules by mouth at bedtime. 08/13/20  Yes Gifford Shave, MD  MULTIPLE VITAMIN PO Take 1 tablet by mouth daily.   Yes [provider]  donepezil (ARICEPT) 10 MG tablet Take 1 tablet (10 mg total) by mouth at bedtime. Patient not taking: Reported on 02/16/2021 05/13/20   Gifford Shave, MD    Scheduled Meds:  sodium chloride flush  3 mL Intravenous Q12H   Infusions:  sodium chloride     PRN Meds: sodium chloride, ibuprofen, ondansetron **OR** ondansetron (ZOFRAN) IV, sodium chloride flush   Allergies as of 02/16/2021   (No Known Allergies)    Family History  Problem Relation Age of Onset   Hypertension Mother     Social History   Socioeconomic History   Marital status: Widowed    Spouse name: Not on file   Number of children: Not on file   Years of education: 12   Highest education level: Not on file  Occupational History   Not on file  Tobacco Use   Smoking status: Never   Smokeless tobacco: Never  Vaping Use   Vaping Use: Never used  Substance and Sexual Activity   Alcohol use: No    Alcohol/week: 0.0 standard drinks   Drug use: No   Sexual activity: Not Currently  Other Topics Concern   Not on file  Social History Narrative   Lives at home with daughter and son-in-law.    Enjoys Child psychotherapist and walking     Primary family support persons is daughter who is also her paid Engineer, agricultural.    Transportation to appointments provided by daughter Lesly Rubenstein.   Receives the following community support services SSI,  food stamps and CAPS services.       Social Determinants of Health   Financial Resource Strain: Low Risk    Difficulty of Paying Living Expenses: Not hard at all  Food Insecurity: No Food Insecurity   Worried About Charity fundraiser in the Last Year: Never true   [place] in the Last Year: Never true  Transportation Needs: No Transportation Needs   Lack of Transportation (Medical): No   Lack of Transportation (Non-Medical): No  Physical Activity: Not on file  Stress: Not on file  Social Connections: Not on file  Intimate Partner Violence: Not on file    REVIEW OF SYSTEMS: Constitutional: Daughter relays more fatigue in the last few days. ENT:  No nose bleeds Pulm: No shortness of breath, no cough. CV:  No palpitations, no LE edema.  No angina. GU:  No hematuria, no frequency GI: No dysphagia.  No bloody emesis.  No constipation. Heme: No unusual bleeding or bruising. Transfusions: None. Neuro:  No headaches, no peripheral tingling or numbness.  No syncope, no seizures. Derm:  No itching, no rash or sores.  Endocrine:  No sweats or chills.  No polyuria or dysuria Immunization: Reviewed.  She is gotten her flu shot.  She has been vaccinated for COVID at least 3 times.  Pneumococcal and Tdap vaccinations up-to-date. Travel:  Not queried.   PHYSICAL EXAM: Vital signs in last 24 hours: Vitals:   02/17/21 0027 02/17/21 0537  BP: 106/61 110/60  Pulse: 80 80  Resp: 18 18  Temp: 97.8 F (36.6 C) 97.6 F (36.4 C)  SpO2: 98% 98%   Wt Readings from Last 3 Encounters:  02/13/21 67.6 kg  08/13/20 67.9 kg  07/11/20 66.7 kg    General: Patient is a healthy appearing, elderly.  She looks younger than stated age. Head: No facial asymmetry or swelling.  No signs of head trauma. Eyes: No scleral icterus.  No conjunctival pallor.  Arcus senilis. Ears: Not hard of hearing Nose: No congestion or discharge Mouth: Teeth healthy appearing though some are missing and overall  crooked dental appearance.  Mucosa is moist, pink, clear. Neck: No JVD, no thyromegaly, no masses Lungs: Clear bilaterally.  No cough.  No labored breathing Heart: RRR.  No MRG.  S1, S2 present Abdomen: Soft.  Active bowel sounds.  No distention.  No masses, HSM, bruits, hernias.  Tenderness without guarding or rebound in the right upper quadrant..   Rectal: Deferred Musc/Skeltl: No gross joint swelling, deformities. Extremities: No CCE. Neurologic: Alert.  Follows commands.  Full grip strength.  Able to sit up in the lounge chair without assistance. Skin: No jaundice.  No sores or rashes Nodes: No cervical adenopathy. Psych: Animated.  Fluid speech.  Cooperative.  Intake/Output from previous day: No intake/output data recorded. Intake/Output this shift: No intake/output data recorded.  LAB RESULTS: Recent Labs    02/16/21 1328 02/17/21 0344  WBC 12.3* 7.3  HGB 14.6 13.6  HCT 45.0 40.6  PLT 279 286   BMET Lab Results  Component Value Date   NA 133 (L) 02/17/2021   NA 137 02/16/2021   NA 135 02/13/2021   K 3.3 (L) 02/17/2021   K 4.1 02/16/2021   K 4.4 02/13/2021   CL 100 02/17/2021   CL 102 02/16/2021   CL 99 02/13/2021   CO2 25 02/17/2021   CO2 27 02/16/2021   CO2 24 02/13/2021   GLUCOSE 113 (H) 02/17/2021   GLUCOSE 108 (H) 02/16/2021   GLUCOSE 145 (H) 02/13/2021   BUN 14 02/17/2021   BUN 21 02/16/2021   BUN 22 02/13/2021   CREATININE 0.88 02/17/2021   CREATININE 0.85 02/16/2021   CREATININE 0.85 02/13/2021   CALCIUM 9.0 02/17/2021   CALCIUM 9.7 02/16/2021   CALCIUM 10.2 02/13/2021   LFT Recent Labs    02/16/21 1328 02/17/21 0344  PROT 7.2 6.5  ALBUMIN 3.3* 2.9*  AST 277* 92*  ALT 108* 95*  ALKPHOS 223* 179*  BILITOT 1.5* 0.8   PT/INR Lab Results  Component Value Date   INR 1.1 02/16/2021   Hepatitis Panel No results for input(s): HEPBSAG, HCVAB, HEPAIGM, HEPBIGM in the last 72 hours. C-Diff No components found for: CDIFF Lipase      Component Value Date/Time   LIPASE 31 02/16/2021 1328    Drugs of Abuse  No results found for: LABOPIA, COCAINSCRNUR, LABBENZ, AMPHETMU, THCU, LABBARB   RADIOLOGY STUDIES: CT ABDOMEN PELVIS W CONTRAST  Result Date: 02/16/2021 CLINICAL DATA:  Right lower quadrant abdominal pain. EXAM: CT ABDOMEN AND PELVIS  WITH CONTRAST TECHNIQUE: Multidetector CT imaging of the abdomen and pelvis was performed using the standard protocol following bolus administration of intravenous contrast. CONTRAST:  186m OMNIPAQUE IOHEXOL 300 MG/ML  SOLN COMPARISON:  Ultrasound abdomen 02/16/2021. FINDINGS: Lower chest: Right-sided fat containing Bochdalek hernia. Scarring in the lung bases. Hepatobiliary: There is diffuse marked gallbladder wall thickening and edema. There is submucosal enhancement with some areas of questionable wall irregularity internally. There is no gallbladder air. Common bowel duct is dilated measuring up to 15 mm. No radiopaque gallstones are identified. There is fatty infiltration of the liver. Pancreas: Unremarkable. No pancreatic ductal dilatation or surrounding inflammatory changes. Spleen: Normal in size without focal abnormality. Adrenals/Urinary Tract: Small bladder diverticulum is present. The kidneys and adrenal glands are within normal limits. Stomach/Bowel: Stomach is within normal limits. Appendix appears normal. No evidence of bowel wall thickening, distention, or inflammatory changes. Vascular/Lymphatic: Aortic atherosclerosis. No enlarged abdominal or pelvic lymph nodes. Reproductive: Uterus and bilateral adnexa are unremarkable. Prominent pelvic vessels are seen. Other: No abdominal wall hernia or abnormality. No abdominopelvic ascites. Musculoskeletal: Degenerative changes affect the spine. There is grade 1 anterolisthesis at L4-L5. IMPRESSION: 1. Marked wall thickening of the gallbladder with some areas of mucosal irregularity. Biliary ductal dilatation. Findings are highly suspicious for  acute cholecystitis. Wall irregularity can be seen in the setting of gangrenous cholecystitis. Please correlate clinically. Surgical consultation recommended. 2. Small bladder diverticulum. 3. Prominent pelvic vessels, nonspecific. 4.  Aortic Atherosclerosis (ICD10-I70.0). Electronically Signed   By: ARonney AstersM.D.   On: 02/16/2021 22:07   [place]dom[place] Limited RUQ (LIVER/GB)  Result Date: 02/16/2021 CLINICAL DATA:  Right upper quadrant pain. EXAM: ULTRASOUND ABDOMEN LIMITED RIGHT UPPER QUADRANT COMPARISON:  None. FINDINGS: Gallbladder: Mild to moderately distended. Wall is irregularly thickened linear areas wall edema. There is also pericholecystic fluid. Small amount of dependent sludge. One echogenic focus noted in the dependent gallbladder consistent with a polyp or nonshadowing stone, 5 mm in size. Patient is tender to transducer pressure over the gallbladder. Common bile duct: Diameter: 1 cm. No visualized duct stone. Most distal aspect of the duct was obscured by bowel gas. Liver: No focal lesion identified. Within normal limits in parenchymal echogenicity. Portal vein is patent on color Doppler imaging with normal direction of blood flow towards the liver. Other: None. IMPRESSION: 1. Abnormal gallbladder. Wall is irregularly thickened with wall edema, measuring up to 9 mm in thickness, associated with a small amount of pericholecystic fluid as well as a positive sonographic Murphy's sign. However, no shadowing stones are noted. Findings support acute cholecystitis in the proper clinical setting. Findings could be further assessed with a nuclear medicine HIDA scan. 2. Dilated common bile duct to 1 cm.  No visualized duct stone. Electronically Signed   By: DLajean ManesM.D.   On: 02/16/2021 15:51      IMPRESSION:      Cholecystitis, concern for gangrenous cholecystitis.   CBD is dilated.  No visualization of stones/sludge in the bile duct on ultrasound and CT scan.  Due to patient's dementia and  inability to follow commands/lay still, MRCP not feasible.  HIDA scan is ordered but wonder about feasibility of this.  Day 2 Zosyn.    PLAN:        HIDA scan ordered.  However surgery canceled this and ordered MRCP.  Apparently the patient never went down for attempted MR so it is worth giving it a try.  MRCP is recommended by Dr. CBryan Lemmaas well.  CMET in AM.     Azucena Freed  02/17/2021, 8:26 AM Phone [phone]

## 2021-02-17 NOTE — Progress Notes (Signed)
Per report, MRI was unsuccessful

## 2021-02-17 NOTE — Progress Notes (Signed)
Pharmacy Antibiotic Note  Ebony Pierce is a 85 y.o. female admitted on 02/16/2021 with intra-abdominal infection.  Pharmacy has been consulted for zosyn dosing.  Plan: Zosyn 3.375g IV q8h (4 hour infusion). Monitor for clinical course, deescalation and LOT     Temp (24hrs), Avg:98.1 F (36.7 C), Min:97.6 F (36.4 C), Max:99.2 F (37.3 C)  Recent Labs  Lab 02/13/21 1931 02/16/21 1328 02/17/21 0344  WBC 9.5 12.3* 7.3  CREATININE 0.85 0.85 0.88    Estimated Creatinine Clearance: 41.5 mL/min (by C-G formula based on SCr of 0.88 mg/dL).    No Known Allergies  Antimicrobials this admission: 11/28 zosyn >>    Dose adjustments this admission:    Zuleyma Scharf A. Jeanella Craze, PharmD, BCPS, FNKF Clinical Pharmacist [place] Please utilize Amion for appropriate phone number to reach the unit pharmacist [facility])  02/17/2021 10:26 AM

## 2021-02-17 NOTE — Progress Notes (Signed)
[facility]  PROGRESS NOTE  Ebony Pierce [id number] DOB: 02-07-1936 DOA: 02/16/2021 PCP: Gifford Shave, MD   Brief HPI:   85 year old female with medical history of dementia, B12 deficiency came to ED with right upper quadrant pain.  She was seen in the ED on 11/24 with similar complaints for 1 day.  Lab work was normal at that time.  CT abdomen pelvis was attempted but patient pulled out And the left since no longer had pain.  She returned to the ED.  Complains of right upper quadrant pain.  Denies nausea vomiting or diarrhea. Right upper quadrant ultrasound showed normal gallbladder, irregularly thickened wall with edema measuring up to 9 mm in thickness, associated with small amount of pericholecystic fluid as well as positive Murphy sign.  General surgery was consulted.    Subjective   Patient was seen by general surgery and gastroenterology.  Initial plan was to get HIDA scan however general surgery canceled HIDA scan and recommended MRCP.   Assessment/Plan:     Right upper quadrant pain in setting of transaminitis -Abnormally thickened gallbladder seen on abdominal ultrasound -General surgery at initially recommended HIDA scan however it was canceled -MRCP ordered for today -LFTs have improved. -Continue IV Zosyn empirically-  Leukocytosis -Resolved, WBCs down to 7.3  Hypokalemia -Potassium was 3.3 -We will replace potassium and follow BMP in am  Dementia with behavior disturbance -Delirium precautions, daughter wants to stay with patient  B12 deficiency anemia -Recent B12 level on 10/01/2020 was within normal limits   Medications     sodium chloride flush  3 mL Intravenous Q12H     Data Reviewed:   CBG:  No results for input(s): GLUCAP in the last 168 hours.  SpO2: 100 %    Vitals:   02/17/21 0027 02/17/21 0537 02/17/21 0900 02/17/21 1459  BP: 106/61 110/60 122/74 133/82  Pulse: 80 80 72 74  Resp: 18 18 16 16   Temp: 97.8 F (36.6  C) 97.6 F (36.4 C) 98.6 F (37 C) 97.7 F (36.5 C)  TempSrc: Oral Oral Oral Oral  SpO2: 98% 98% 97% 100%     Intake/Output Summary (Last 24 hours) at 02/17/2021 1751 Last data filed at 02/17/2021 1621 Gross per 24 hour  Intake 13.96 ml  Output --  Net 13.96 ml    No intake/output data recorded.  There were no vitals filed for this visit.  Data Reviewed: Basic Metabolic Panel: Recent Labs  Lab 02/13/21 1931 02/16/21 1328 02/17/21 0344  NA 135 137 133*  K 4.4 4.1 3.3*  CL 99 102 100  CO2 24 27 25   GLUCOSE 145* 108* 113*  BUN 22 21 14   CREATININE 0.85 0.85 0.88  CALCIUM 10.2 9.7 9.0   Liver Function Tests: Recent Labs  Lab 02/13/21 1931 02/16/21 1328 02/17/21 0344  AST 28 277* 92*  ALT 17 108* 95*  ALKPHOS 69 223* 179*  BILITOT 0.8 1.5* 0.8  PROT 7.5 7.2 6.5  ALBUMIN 4.1 3.3* 2.9*   Recent Labs  Lab 02/13/21 1931 02/16/21 1328  LIPASE 31 31   No results for input(s): AMMONIA in the last 168 hours. CBC: Recent Labs  Lab 02/13/21 1931 02/16/21 1328 02/17/21 0344  WBC 9.5 12.3* 7.3  NEUTROABS 8.5* 9.8*  --   HGB 15.6* 14.6 13.6  HCT 50.3* 45.0 40.6  MCV 91.3 90.4 87.3  PLT 305 279 286   Cardiac Enzymes: No results for input(s): CKTOTAL, CKMB, CKMBINDEX, TROPONINI in the last 168 hours. BNP (last 3  results) No results for input(s): BNP in the last 8760 hours.  ProBNP (last 3 results) No results for input(s): PROBNP in the last 8760 hours.  CBG: No results for input(s): GLUCAP in the last 168 hours.     Radiology Reports  CT ABDOMEN PELVIS W CONTRAST  Result Date: 02/16/2021 CLINICAL DATA:  Right lower quadrant abdominal pain. EXAM: CT ABDOMEN AND PELVIS WITH CONTRAST TECHNIQUE: Multidetector CT imaging of the abdomen and pelvis was performed using the standard protocol following bolus administration of intravenous contrast. CONTRAST:  OMNIPAQUE IOHEXOL 300 MG/ML  SOLN COMPARISON:  Ultrasound abdomen 02/16/2021. FINDINGS: Lower  chest: Right-sided fat containing Bochdalek hernia. Scarring in the lung bases. Hepatobiliary: There is diffuse marked gallbladder wall thickening and edema. There is submucosal enhancement with some areas of questionable wall irregularity internally. There is no gallbladder air. Common bowel duct is dilated measuring up to 15 mm. No radiopaque gallstones are identified. There is fatty infiltration of the liver. Pancreas: Unremarkable. No pancreatic ductal dilatation or surrounding inflammatory changes. Spleen: Normal in size without focal abnormality. Adrenals/Urinary Tract: Small bladder diverticulum is present. The kidneys and adrenal glands are within normal limits. Stomach/Bowel: Stomach is within normal limits. Appendix appears normal. No evidence of bowel wall thickening, distention, or inflammatory changes. Vascular/Lymphatic: Aortic atherosclerosis. No enlarged abdominal or pelvic lymph nodes. Reproductive: Uterus and bilateral adnexa are unremarkable. Prominent pelvic vessels are seen. Other: No abdominal wall hernia or abnormality. No abdominopelvic ascites. Musculoskeletal: Degenerative changes affect the spine. There is grade 1 anterolisthesis at L4-L5. IMPRESSION: 1. Marked wall thickening of the gallbladder with some areas of mucosal irregularity. Biliary ductal dilatation. Findings are highly suspicious for acute cholecystitis. Wall irregularity can be seen in the setting of gangrenous cholecystitis. Please correlate clinically. Surgical consultation recommended. 2. Small bladder diverticulum. 3. Prominent pelvic vessels, nonspecific. 4.  Aortic Atherosclerosis (ICD10-I70.0). Electronically Signed   By: Darliss Cheney M.D.   On: 02/16/2021 22:07   US Abdomen Limited RUQ (LIVER/GB)  Result Date: 02/16/2021 CLINICAL DATA:  Right upper quadrant pain. EXAM: ULTRASOUND ABDOMEN LIMITED RIGHT UPPER QUADRANT COMPARISON:  None. FINDINGS: Gallbladder: Mild to moderately distended. Wall is irregularly  thickened linear areas wall edema. There is also pericholecystic fluid. Small amount of dependent sludge. One echogenic focus noted in the dependent gallbladder consistent with a polyp or nonshadowing stone, 5 mm in size. Patient is tender to transducer pressure over the gallbladder. Common bile duct: Diameter: 1 cm. No visualized duct stone. Most distal aspect of the duct was obscured by bowel gas. Liver: No focal lesion identified. Within normal limits in parenchymal echogenicity. Portal vein is patent on color Doppler imaging with normal direction of blood flow towards the liver. Other: None. IMPRESSION: 1. Abnormal gallbladder. Wall is irregularly thickened with wall edema, measuring up to 9 mm in thickness, associated with a small amount of pericholecystic fluid as well as a positive sonographic Murphy's sign. However, no shadowing stones are noted. Findings support acute cholecystitis in the proper clinical setting. Findings could be further assessed with a nuclear medicine HIDA scan. 2. Dilated common bile duct to 1 cm.  No visualized duct stone. Electronically Signed   By: Amie Portland M.D.   On: 02/16/2021 15:51       Antibiotics: Anti-infectives (From admission, onward)    Start     Dose/Rate Route Frequency Ordered Stop   02/17/21 1400  piperacillin-tazobactam (ZOSYN) IVPB 3.375 g        3.375 g 12.5 mL/hr over 240 Minutes  Intravenous Every 8 hours 02/17/21 1028     02/16/21 1715  piperacillin-tazobactam (ZOSYN) IVPB 3.375 g        3.375 g 100 mL/hr over 30 Minutes Intravenous  Once 02/16/21 1701 02/16/21 2217         DVT prophylaxis: Lovenox  Code Status: Full code  Family Communication: Spoke with daughter at bedside   Consultants: General surgery Gastroenterology  Procedures:     Objective    Physical Examination:   General-appears in no acute distress Heart-S1-S2, regular, no murmur auscultated Lungs-clear to auscultation bilaterally, no wheezing or  crackles auscultated Abdomen-soft, positive right upper quadrant tenderness to palpation Extremities-no edema in the lower extremities Neuro-alert, oriented x3, no focal deficit noted  Status is: Inpatient  Dispo: The patient is from: Home              Anticipated d/c is to: Home              Anticipated d/c date is: 02/19/2021              Patient currently not stable for discharge  Barrier to discharge-ongoing evaluation for right upper quadrant pain  COVID-19 Labs  No results for input(s): DDIMER, FERRITIN, LDH, CRP in the last 72 hours.  Lab Results  Component Value Date   [place] NEGATIVE 02/16/2021            Recent Results (from the past 240 hour(s))  Resp Panel by RT-PCR (Flu A&B, Covid) Nasopharyngeal Swab     Status: None   Collection Time: 02/16/21 10:38 PM   Specimen: Nasopharyngeal Swab; Nasopharyngeal(NP) swabs in vial transport medium  Result Value Ref Range Status   SARS Coronavirus 2 by RT PCR NEGATIVE NEGATIVE Final    Comment: (NOTE) SARS-CoV-2 target nucleic acids are NOT DETECTED.  The SARS-CoV-2 RNA is generally detectable in upper respiratory specimens during the acute phase of infection. The lowest concentration of SARS-CoV-2 viral copies this assay can detect is 138 copies/mL. A negative result does not preclude SARS-Cov-2 infection and should not be used as the sole basis for treatment or other patient management decisions. A negative result may occur with  improper specimen collection/handling, submission of specimen other than nasopharyngeal swab, presence of viral mutation(s) within the areas targeted by this assay, and inadequate number of viral copies(<138 copies/mL). A negative result must be combined with clinical observations, patient history, and epidemiological information. The expected result is Negative.  Fact Sheet for Patients:  EntrepreneurPulse.com.au  Fact Sheet for Healthcare Providers:   IncredibleEmployment.be  This test is no t yet approved or cleared by the [place] FDA and  has been authorized for detection and/or diagnosis of SARS-CoV-2 by FDA under an Emergency Use Authorization (EUA). This EUA will remain  in effect (meaning this test can be used) for the duration of the COVID-19 declaration under Section 564(b)(1) of the Act, 21 U.S.C.section 360bbb-3(b)(1), unless the authorization is terminated  or revoked sooner.       Influenza A by PCR NEGATIVE NEGATIVE Final   Influenza B by PCR NEGATIVE NEGATIVE Final    Comment: (NOTE) The Xpert Xpress SARS-CoV-2/FLU/RSV plus assay is intended as an aid in the diagnosis of influenza from Nasopharyngeal swab specimens and should not be used as a sole basis for treatment. Nasal washings and aspirates are unacceptable for Xpert Xpress SARS-CoV-2/FLU/RSV testing.  Fact Sheet for Patients: EntrepreneurPulse.com.au  Fact Sheet for Healthcare Providers: IncredibleEmployment.be  This test is not yet approved or cleared by the  [place] Architectural technologist and has been authorized for detection and/or diagnosis of SARS-CoV-2 by FDA under an Print production planner (EUA). This EUA will remain in effect (meaning this test can be used) for the duration of the COVID-19 declaration under Section 564(b)(1) of the Act, 21 U.S.C. section 360bbb-3(b)(1), unless the authorization is terminated or revoked.  Performed at [facility], [address], [place], [place] [address]     [facility] If 7PM-7AM, please contact night-coverage at [email or web address], Office  [phone]   02/17/2021, 5:51 PM  LOS: 0 days

## 2021-02-17 NOTE — ED Notes (Signed)
Pt pulled out IV. New iv placed and coband wrapped around IV.

## 2021-02-17 NOTE — Care Management Obs Status (Signed)
MEDICARE OBSERVATION STATUS NOTIFICATION   Patient Details  Name: Ebony Pierce MRN: [id number] Date of Birth: 07-10-1935   Medicare Observation Status Notification Given:  Yes, to pt daughter [name].     Lorri Frederick, LCSW 02/17/2021, 3:52 PM

## 2021-02-17 NOTE — Progress Notes (Signed)
Subjective: CC: Daughter at beside who helps translate. Still having RUQ pain with palpation. Hungry. No n/v.   Daughter reports no pmhx besides dementia, no daily medication and no prior abdominal surgeries. Patient lives at home with her daughter and son in law.   Objective: Vital signs in last 24 hours: Temp:  [97.6 F (36.4 C)-99.2 F (37.3 C)] 97.6 F (36.4 C) (11/28 0537) Pulse Rate:  [74-81] 80 (11/28 0537) Resp:  [17-18] 18 (11/28 0537) BP: (106-118)/(60-77) 110/60 (11/28 0537) SpO2:  [97 %-100 %] 98 % (11/28 0537) Last BM Date: 02/16/21  Intake/Output from previous day: No intake/output data recorded. Intake/Output this shift: No intake/output data recorded.  PE: Gen:  Alert, NAD, pleasant Pulm: normal rate and effort  Abd: Soft, ND, RUQ tenderness, +BS Skin: no rashes noted, warm and dry  Lab Results:  Recent Labs    02/16/21 1328 02/17/21 0344  WBC 12.3* 7.3  HGB 14.6 13.6  HCT 45.0 40.6  PLT 279 286   BMET Recent Labs    02/16/21 1328 02/17/21 0344  NA 137 133*  K 4.1 3.3*  CL 102 100  CO2 27 25  GLUCOSE 108* 113*  BUN 21 14  CREATININE 0.85 0.88  CALCIUM 9.7 9.0   PT/INR Recent Labs    02/16/21 2044  LABPROT 13.8  INR 1.1   CMP     Component Value Date/Time   NA 133 (L) 02/17/2021 0344   NA 143 08/13/2020 1431   K 3.3 (L) 02/17/2021 0344   CL 100 02/17/2021 0344   CO2 25 02/17/2021 0344   GLUCOSE 113 (H) 02/17/2021 0344   BUN 14 02/17/2021 0344   BUN 15 08/13/2020 1431   CREATININE 0.88 02/17/2021 0344   CREATININE 0.83 06/18/2015 0845   CALCIUM 9.0 02/17/2021 0344   PROT 6.5 02/17/2021 0344   PROT 7.2 08/13/2020 1431   ALBUMIN 2.9 (L) 02/17/2021 0344   ALBUMIN 4.3 08/13/2020 1431   AST 92 (H) 02/17/2021 0344   ALT 95 (H) 02/17/2021 0344   ALKPHOS 179 (H) 02/17/2021 0344   BILITOT 0.8 02/17/2021 0344   BILITOT <0.2 08/13/2020 1431   GFRNONAA >60 02/17/2021 0344   GFRNONAA 68 06/18/2015 0845   GFRAA 66  09/12/2019 1724   GFRAA 79 06/18/2015 0845   Lipase     Component Value Date/Time   LIPASE 31 02/16/2021 1328    Studies/Results: CT ABDOMEN PELVIS W CONTRAST  Result Date: 02/16/2021 CLINICAL DATA:  Right lower quadrant abdominal pain. EXAM: CT ABDOMEN AND PELVIS WITH CONTRAST TECHNIQUE: Multidetector CT imaging of the abdomen and pelvis was performed using the standard protocol following bolus administration of intravenous contrast. CONTRAST:  OMNIPAQUE IOHEXOL 300 MG/ML  SOLN COMPARISON:  Ultrasound abdomen 02/16/2021. FINDINGS: Lower chest: Right-sided fat containing Bochdalek hernia. Scarring in the lung bases. Hepatobiliary: There is diffuse marked gallbladder wall thickening and edema. There is submucosal enhancement with some areas of questionable wall irregularity internally. There is no gallbladder air. Common bowel duct is dilated measuring up to 15 mm. No radiopaque gallstones are identified. There is fatty infiltration of the liver. Pancreas: Unremarkable. No pancreatic ductal dilatation or surrounding inflammatory changes. Spleen: Normal in size without focal abnormality. Adrenals/Urinary Tract: Small bladder diverticulum is present. The kidneys and adrenal glands are within normal limits. Stomach/Bowel: Stomach is within normal limits. Appendix appears normal. No evidence of bowel wall thickening, distention, or inflammatory changes. Vascular/Lymphatic: Aortic atherosclerosis. No enlarged abdominal or pelvic lymph nodes.  Reproductive: Uterus and bilateral adnexa are unremarkable. Prominent pelvic vessels are seen. Other: No abdominal wall hernia or abnormality. No abdominopelvic ascites. Musculoskeletal: Degenerative changes affect the spine. There is grade 1 anterolisthesis at L4-L5. IMPRESSION: 1. Marked wall thickening of the gallbladder with some areas of mucosal irregularity. Biliary ductal dilatation. Findings are highly suspicious for acute cholecystitis. Wall irregularity  can be seen in the setting of gangrenous cholecystitis. Please correlate clinically. Surgical consultation recommended. 2. Small bladder diverticulum. 3. Prominent pelvic vessels, nonspecific. 4.  Aortic Atherosclerosis (ICD10-I70.0). Electronically Signed   By: Darliss Cheney M.D.   On: 02/16/2021 22:07   US Abdomen Limited RUQ (LIVER/GB)  Result Date: 02/16/2021 CLINICAL DATA:  Right upper quadrant pain. EXAM: ULTRASOUND ABDOMEN LIMITED RIGHT UPPER QUADRANT COMPARISON:  None. FINDINGS: Gallbladder: Mild to moderately distended. Wall is irregularly thickened linear areas wall edema. There is also pericholecystic fluid. Small amount of dependent sludge. One echogenic focus noted in the dependent gallbladder consistent with a polyp or nonshadowing stone, 5 mm in size. Patient is tender to transducer pressure over the gallbladder. Common bile duct: Diameter: 1 cm. No visualized duct stone. Most distal aspect of the duct was obscured by bowel gas. Liver: No focal lesion identified. Within normal limits in parenchymal echogenicity. Portal vein is patent on color Doppler imaging with normal direction of blood flow towards the liver. Other: None. IMPRESSION: 1. Abnormal gallbladder. Wall is irregularly thickened with wall edema, measuring up to 9 mm in thickness, associated with a small amount of pericholecystic fluid as well as a positive sonographic Murphy's sign. However, no shadowing stones are noted. Findings support acute cholecystitis in the proper clinical setting. Findings could be further assessed with a nuclear medicine HIDA scan. 2. Dilated common bile duct to 1 cm.  No visualized duct stone. Electronically Signed   By: Amie Portland M.D.   On: 02/16/2021 15:51    Anti-infectives: Anti-infectives (From admission, onward)    Start     Dose/Rate Route Frequency Ordered Stop   02/16/21 1715  piperacillin-tazobactam (ZOSYN) IVPB 3.375 g        3.375 g 100 mL/hr over 30 Minutes Intravenous  Once  02/16/21 1701 02/16/21 2217        Assessment/Plan RUQ abdominal pain Elevated LFT's with dilated CBD - CT w/ Marked wall thickening of the gallbladder with some areas of mucosal irregularity. Biliary ductal dilatation to 31mm without stones visualized.  - RUQ [place] w/ GB wall edema/thickening, small amount of pericholecystic fluid, and 1cm dilated CBD without visualized stone. There is One echogenic focus noted in the dependent gallbladder consistent with a polyp or nonshadowing stone. Otherwise no stones identified.  - LFT's downtrending this am. Discussed with GI. Will plan to start with MRCP for evaluation.  - Cont IV abx   FEN - NPO VTE - SCDs, okay with chemical prophylaxis from a general surgery standpoint ID - Zosyn    LOS: 0 days    Jacinto Halim , [facility] Surgery 02/17/2021, 10:05 AM Please see Amion for pager number during day hours 7:00am-4:30pm

## 2021-02-18 ENCOUNTER — Encounter (HOSPITAL_COMMUNITY): Payer: Self-pay | Admitting: Family Medicine

## 2021-02-18 ENCOUNTER — Inpatient Hospital Stay (HOSPITAL_COMMUNITY): Payer: Medicare Other

## 2021-02-18 ENCOUNTER — Inpatient Hospital Stay (HOSPITAL_COMMUNITY): Payer: Medicare Other | Admitting: Certified Registered Nurse Anesthetist

## 2021-02-18 ENCOUNTER — Encounter (HOSPITAL_COMMUNITY)
Admission: EM | Disposition: A | Discharge status: Home Health | Payer: Self-pay | Source: Home / Self Care | Attending: Internal Medicine

## 2021-02-18 DIAGNOSIS — R1011 Right upper quadrant pain: Secondary | ICD-10-CM | POA: Diagnosis not present

## 2021-02-18 DIAGNOSIS — F03918 Unspecified dementia, unspecified severity, with other behavioral disturbance: Secondary | ICD-10-CM | POA: Diagnosis not present

## 2021-02-18 DIAGNOSIS — R748 Abnormal levels of other serum enzymes: Secondary | ICD-10-CM | POA: Diagnosis not present

## 2021-02-18 DIAGNOSIS — R932 Abnormal findings on diagnostic imaging of liver and biliary tract: Secondary | ICD-10-CM | POA: Diagnosis not present

## 2021-02-18 DIAGNOSIS — Z419 Encounter for procedure for purposes other than remedying health state, unspecified: Secondary | ICD-10-CM

## 2021-02-18 HISTORY — PX: CHOLECYSTECTOMY: SHX55

## 2021-02-18 LAB — COMPREHENSIVE METABOLIC PANEL
ALT: 67 U/L — ABNORMAL HIGH (ref 0–44)
AST: 41 U/L (ref 15–41)
Albumin: 2.9 g/dL — ABNORMAL LOW (ref 3.5–5.0)
Alkaline Phosphatase: 177 U/L — ABNORMAL HIGH (ref 38–126)
Anion gap: 10 (ref 5–15)
BUN: 13 mg/dL (ref 8–23)
CO2: 25 mmol/L (ref 22–32)
Calcium: 9.4 mg/dL (ref 8.9–10.3)
Chloride: 105 mmol/L (ref 98–111)
Creatinine, Ser: 0.89 mg/dL (ref 0.44–1.00)
GFR, Estimated: >60 mL/min (ref >60)
Glucose, Bld: 106 mg/dL — ABNORMAL HIGH (ref 70–99)
Potassium: 3.4 mmol/L — ABNORMAL LOW (ref 3.5–5.1)
Sodium: 140 mmol/L (ref 135–145)
Total Bilirubin: 1 mg/dL (ref 0.3–1.2)
Total Protein: 6.5 g/dL (ref 6.5–8.1)

## 2021-02-18 LAB — HEMOGLOBIN AND HEMATOCRIT, BLOOD
HCT: 41.1 % (ref 36.0–46.0)
Hemoglobin: 12.9 g/dL (ref 12.0–15.0)

## 2021-02-18 LAB — CBC
HCT: 40 % (ref 36.0–46.0)
Hemoglobin: 13 g/dL (ref 12.0–15.0)
MCH: 28.9 pg (ref 26.0–34.0)
MCHC: 32.5 g/dL (ref 30.0–36.0)
MCV: 88.9 fL (ref 80.0–100.0)
Platelets: 334 10*3/uL (ref 150–400)
RBC: 4.5 MIL/uL (ref 3.87–5.11)
RDW: 13.6 % (ref 11.5–15.5)
WBC: 5.8 10*3/uL (ref 4.0–10.5)
nRBC: 0 % (ref 0.0–0.2)

## 2021-02-18 SURGERY — LAPAROSCOPIC CHOLECYSTECTOMY WITH INTRAOPERATIVE CHOLANGIOGRAM
Anesthesia: General

## 2021-02-18 MED ORDER — ENOXAPARIN SODIUM 40 MG/0.4ML IJ SOSY
40.0000 mg | PREFILLED_SYRINGE | INTRAMUSCULAR | Status: DC
  Administered 2021-02-19: 40 mg via SUBCUTANEOUS
  Filled 2021-02-18: qty 0.4

## 2021-02-18 MED ORDER — SUCCINYLCHOLINE CHLORIDE 200 MG/10ML IV SOSY
PREFILLED_SYRINGE | INTRAVENOUS | Status: DC | PRN
  Administered 2021-02-18: 100 mg via INTRAVENOUS

## 2021-02-18 MED ORDER — SODIUM CHLORIDE 0.9 % IV SOLN
INTRAVENOUS | Status: DC | PRN
  Administered 2021-02-18: 20 mL

## 2021-02-18 MED ORDER — BUPIVACAINE-EPINEPHRINE (PF) 0.25% -1:200000 IJ SOLN
INTRAMUSCULAR | Status: AC
  Filled 2021-02-18: qty 30

## 2021-02-18 MED ORDER — LIDOCAINE 2% (20 MG/ML) 5 ML SYRINGE
INTRAMUSCULAR | Status: AC
  Filled 2021-02-18: qty 10

## 2021-02-18 MED ORDER — FENTANYL CITRATE (PF) 100 MCG/2ML IJ SOLN
INTRAMUSCULAR | Status: AC
  Filled 2021-02-18: qty 2

## 2021-02-18 MED ORDER — CELECOXIB 200 MG PO CAPS
ORAL_CAPSULE | ORAL | Status: AC
  Filled 2021-02-18: qty 1

## 2021-02-18 MED ORDER — CHLORHEXIDINE GLUCONATE 0.12 % MT SOLN
OROMUCOSAL | Status: AC
  Filled 2021-02-18: qty 15

## 2021-02-18 MED ORDER — FENTANYL CITRATE (PF) 250 MCG/5ML IJ SOLN
INTRAMUSCULAR | Status: AC
  Filled 2021-02-18: qty 5

## 2021-02-18 MED ORDER — ALBUMIN HUMAN 5 % IV SOLN: INTRAVENOUS | Status: DC | PRN

## 2021-02-18 MED ORDER — ONDANSETRON HCL 4 MG/2ML IJ SOLN
INTRAMUSCULAR | Status: AC
  Filled 2021-02-18: qty 2

## 2021-02-18 MED ORDER — ACETAMINOPHEN 500 MG PO TABS
ORAL_TABLET | ORAL | Status: AC
  Filled 2021-02-18: qty 2

## 2021-02-18 MED ORDER — OXYCODONE HCL 5 MG PO TABS
ORAL_TABLET | ORAL | Status: AC
  Filled 2021-02-18: qty 1

## 2021-02-18 MED ORDER — EPHEDRINE SULFATE-NACL 50-0.9 MG/10ML-% IV SOSY
PREFILLED_SYRINGE | INTRAVENOUS | Status: DC | PRN
  Administered 2021-02-18: 5 mg via INTRAVENOUS

## 2021-02-18 MED ORDER — FENTANYL CITRATE (PF) 100 MCG/2ML IJ SOLN
25.0000 ug | INTRAMUSCULAR | Status: DC | PRN
  Administered 2021-02-18 (×2): 25 ug via INTRAVENOUS

## 2021-02-18 MED ORDER — PHENYLEPHRINE HCL-NACL 20-0.9 MG/250ML-% IV SOLN
INTRAVENOUS | Status: DC | PRN
  Administered 2021-02-18: 25 ug/min via INTRAVENOUS

## 2021-02-18 MED ORDER — CHLORHEXIDINE GLUCONATE 0.12 % MT SOLN
15.0000 mL | Freq: Once | OROMUCOSAL | Status: AC
  Administered 2021-02-18: 15 mL via OROMUCOSAL

## 2021-02-18 MED ORDER — FENTANYL CITRATE (PF) 250 MCG/5ML IJ SOLN
INTRAMUSCULAR | Status: DC | PRN
  Administered 2021-02-18: 25 ug via INTRAVENOUS
  Administered 2021-02-18: 100 ug via INTRAVENOUS
  Administered 2021-02-18 (×2): 25 ug via INTRAVENOUS

## 2021-02-18 MED ORDER — LIDOCAINE 2% (20 MG/ML) 5 ML SYRINGE
INTRAMUSCULAR | Status: DC | PRN
  Administered 2021-02-18: 100 mg via INTRAVENOUS

## 2021-02-18 MED ORDER — ROCURONIUM BROMIDE 10 MG/ML (PF) SYRINGE
PREFILLED_SYRINGE | INTRAVENOUS | Status: AC
  Filled 2021-02-18: qty 10

## 2021-02-18 MED ORDER — ORAL CARE MOUTH RINSE: 15.0000 mL | Freq: Once | OROMUCOSAL | Status: AC

## 2021-02-18 MED ORDER — SUGAMMADEX SODIUM 200 MG/2ML IV SOLN
INTRAVENOUS | Status: DC | PRN
  Administered 2021-02-18: 200 mg via INTRAVENOUS

## 2021-02-18 MED ORDER — LORAZEPAM 2 MG/ML IJ SOLN: 1.0000 mg | Freq: Once | INTRAMUSCULAR | Status: DC

## 2021-02-18 MED ORDER — DEXAMETHASONE SODIUM PHOSPHATE 10 MG/ML IJ SOLN
INTRAMUSCULAR | Status: DC | PRN
  Administered 2021-02-18: 5 mg via INTRAVENOUS

## 2021-02-18 MED ORDER — SUCCINYLCHOLINE CHLORIDE 200 MG/10ML IV SOSY
PREFILLED_SYRINGE | INTRAVENOUS | Status: AC
  Filled 2021-02-18: qty 10

## 2021-02-18 MED ORDER — ROCURONIUM BROMIDE 10 MG/ML (PF) SYRINGE
PREFILLED_SYRINGE | INTRAVENOUS | Status: DC | PRN
  Administered 2021-02-18: 40 mg via INTRAVENOUS
  Administered 2021-02-18: 20 mg via INTRAVENOUS

## 2021-02-18 MED ORDER — PROMETHAZINE HCL 25 MG/ML IJ SOLN: 6.2500 mg | INTRAMUSCULAR | Status: DC | PRN

## 2021-02-18 MED ORDER — LACTATED RINGERS IV SOLN: INTRAVENOUS | Status: DC

## 2021-02-18 MED ORDER — AMISULPRIDE (ANTIEMETIC) 5 MG/2ML IV SOLN: 10.0000 mg | Freq: Once | INTRAVENOUS | Status: DC | PRN

## 2021-02-18 MED ORDER — SODIUM CHLORIDE 0.9 % IR SOLN
Status: DC | PRN
  Administered 2021-02-18: 1000 mL

## 2021-02-18 MED ORDER — CELECOXIB 200 MG PO CAPS
200.0000 mg | ORAL_CAPSULE | Freq: Once | ORAL | Status: AC
  Administered 2021-02-18: 200 mg via ORAL

## 2021-02-18 MED ORDER — ACETAMINOPHEN 500 MG PO TABS
1000.0000 mg | ORAL_TABLET | Freq: Once | ORAL | Status: AC
  Administered 2021-02-18: 1000 mg via ORAL

## 2021-02-18 MED ORDER — ONDANSETRON HCL 4 MG/2ML IJ SOLN
INTRAMUSCULAR | Status: DC | PRN
  Administered 2021-02-18: 4 mg via INTRAVENOUS

## 2021-02-18 MED ORDER — PHENYLEPHRINE 40 MCG/ML (10ML) SYRINGE FOR IV PUSH (FOR BLOOD PRESSURE SUPPORT)
PREFILLED_SYRINGE | INTRAVENOUS | Status: DC | PRN
  Administered 2021-02-18: 40 ug via INTRAVENOUS
  Administered 2021-02-18: 120 ug via INTRAVENOUS
  Administered 2021-02-18 (×2): 80 ug via INTRAVENOUS
  Administered 2021-02-18: 120 ug via INTRAVENOUS
  Administered 2021-02-18: 40 ug via INTRAVENOUS
  Administered 2021-02-18: 80 ug via INTRAVENOUS
  Administered 2021-02-18 (×2): 40 ug via INTRAVENOUS

## 2021-02-18 MED ORDER — OXYCODONE HCL 5 MG PO TABS
5.0000 mg | ORAL_TABLET | Freq: Four times a day (QID) | ORAL | Status: DC | PRN
  Administered 2021-02-18: 5 mg via ORAL

## 2021-02-18 MED ORDER — PROPOFOL 10 MG/ML IV BOLUS
INTRAVENOUS | Status: DC | PRN
  Administered 2021-02-18: 100 mg via INTRAVENOUS

## 2021-02-18 MED ORDER — BUPIVACAINE HCL 0.25 % IJ SOLN
INTRAMUSCULAR | Status: DC | PRN
  Administered 2021-02-18: 20 mL

## 2021-02-18 MED ORDER — 0.9 % SODIUM CHLORIDE (POUR BTL) OPTIME
TOPICAL | Status: DC | PRN
  Administered 2021-02-18: 1000 mL

## 2021-02-18 MED ORDER — LACTATED RINGERS IV SOLN: INTRAVENOUS | Status: AC

## 2021-02-18 MED ORDER — DEXAMETHASONE SODIUM PHOSPHATE 10 MG/ML IJ SOLN
INTRAMUSCULAR | Status: AC
  Filled 2021-02-18: qty 1

## 2021-02-18 SURGICAL SUPPLY — 45 items
APPLICATOR ARISTA FLEXITIP XL (MISCELLANEOUS) ×2 IMPLANT
APPLIER CLIP 5 13 M/L LIGAMAX5 (MISCELLANEOUS) ×4
BAG COUNTER SPONGE SURGICOUNT (BAG) ×2 IMPLANT
CANISTER SUCT 3000ML PPV (MISCELLANEOUS) ×2 IMPLANT
CLIP APPLIE 5 13 M/L LIGAMAX5 (MISCELLANEOUS) ×2 IMPLANT
COVER MAYO STAND STRL (DRAPES) ×2 IMPLANT
COVER SURGICAL LIGHT HANDLE (MISCELLANEOUS) ×2 IMPLANT
DERMABOND ADVANCED (GAUZE/BANDAGES/DRESSINGS) ×1
DERMABOND ADVANCED .7 DNX12 (GAUZE/BANDAGES/DRESSINGS) ×1 IMPLANT
DRAIN CHANNEL 19F RND (DRAIN) ×2 IMPLANT
DRAPE C-ARM 42X120 X-RAY (DRAPES) ×2 IMPLANT
ELECT REM PT RETURN 9FT ADLT (ELECTROSURGICAL) ×2
ELECTRODE REM PT RTRN 9FT ADLT (ELECTROSURGICAL) ×1 IMPLANT
EVACUATOR SILICONE 100CC (DRAIN) ×2 IMPLANT
GAUZE SPONGE 4X4 12PLY STRL (GAUZE/BANDAGES/DRESSINGS) ×2 IMPLANT
GLOVE SURG POLYISO LF SZ7 (GLOVE) ×2 IMPLANT
GLOVE SURG UNDER POLY LF SZ7 (GLOVE) ×2 IMPLANT
GOWN STRL REUS W/ TWL LRG LVL3 (GOWN DISPOSABLE) ×3 IMPLANT
GOWN STRL REUS W/TWL LRG LVL3 (GOWN DISPOSABLE) ×3
GRASPER SUT TROCAR 14GX15 (MISCELLANEOUS) ×2 IMPLANT
HEMOSTAT ARISTA ABSORB 3G PWDR (HEMOSTASIS) ×2 IMPLANT
IRRIG SUCT STRYKERFLOW 2 WTIP (MISCELLANEOUS) ×2
IRRIGATION SUCT STRKRFLW 2 WTP (MISCELLANEOUS) ×1 IMPLANT
KIT BASIN OR (CUSTOM PROCEDURE TRAY) ×2 IMPLANT
KIT TURNOVER KIT B (KITS) ×2 IMPLANT
NEEDLE 22X1 1/2 (OR ONLY) (NEEDLE) ×2 IMPLANT
NS IRRIG 1000ML POUR BTL (IV SOLUTION) ×2 IMPLANT
PAD ARMBOARD 7.5X6 YLW CONV (MISCELLANEOUS) ×2 IMPLANT
POUCH RETRIEVAL ECOSAC 10 (ENDOMECHANICALS) ×1 IMPLANT
POUCH RETRIEVAL ECOSAC 10MM (ENDOMECHANICALS) ×1
SCISSORS LAP 5X35 DISP (ENDOMECHANICALS) ×2 IMPLANT
SET IRRIG TUBING LAPAROSCOPIC (IRRIGATION / IRRIGATOR) ×2 IMPLANT
SET TUBE SMOKE EVAC HIGH FLOW (TUBING) ×2 IMPLANT
SLEEVE ENDOPATH XCEL 5M (ENDOMECHANICALS) ×2 IMPLANT
SPECIMEN JAR SMALL (MISCELLANEOUS) ×2 IMPLANT
SPONGE DRAIN TRACH 4X4 STRL 2S (GAUZE/BANDAGES/DRESSINGS) ×2 IMPLANT
STOPCOCK 4 WAY LG BORE MALE ST (IV SETS) ×2 IMPLANT
SUT ETHILON 2 0 FS 18 (SUTURE) ×2 IMPLANT
SUT MNCRL AB 4-0 PS2 18 (SUTURE) ×2 IMPLANT
SUT VICRYL 0 UR6 27IN ABS (SUTURE) ×4 IMPLANT
TOWEL GREEN STERILE (TOWEL DISPOSABLE) ×2 IMPLANT
TOWEL GREEN STERILE FF (TOWEL DISPOSABLE) ×2 IMPLANT
TRAY LAPAROSCOPIC MC (CUSTOM PROCEDURE TRAY) ×2 IMPLANT
TROCAR XCEL 12X100 BLDLESS (ENDOMECHANICALS) ×4 IMPLANT
TROCAR XCEL NON-BLD 5MMX100MML (ENDOMECHANICALS) ×2 IMPLANT

## 2021-02-18 NOTE — Progress Notes (Addendum)
Attending physician's note   I have reviewed the chart and discussed her care on rounds. I agree with the Advanced Practitioner's note, impression, and recommendations as outlined.   Unable to complete MRCP due to underlying patient dementia.  Otherwise liver enzymes downtrending and T bili normal.  Normal WBC.  Acute viral hepatitis panel was negative/normal.  Plan for lap cholecystectomy with IOC today with General Surgery.  GI service will follow peripherally at this time.  Please contact us if stone seen on IOC and we can set up for ERCP as appropriate.  Larri Yehle, DO, FACG [phone] office                Daily Rounding Note  02/18/2021, 11:43 AM  LOS: 1 day   SUBJECTIVE:   Chief complaint:   Elevated LFTs, imaging suspicious for cholecystitis.  Dilated CBD but no definite CBD stone per ultrasound  Failed attempt at MRCP/MRI.  Lap chole set for this afternoon.  No recurrent abd pain, no nausea or vomiting.  She is hungry after > 36 h NPO.  OBJECTIVE:         Vital signs in last 24 hours:    Temp:  [97.7 F (36.5 C)-98 F (36.7 C)] 98 F (36.7 C) (11/29 0700) Pulse Rate:  [63-74] 63 (11/29 0700) Resp:  [16-18] 18 (11/29 0700) BP: (115-143)/(62-82) 143/69 (11/29 0700) SpO2:  [96 %-100 %] 100 % (11/29 0700) Last BM Date: 02/16/21 There were no vitals filed for this visit. General: NAD.  Comfortable. Heart: RRR. Chest: No labored breathing or cough Abdomen: Not distended.  Did not palpate or auscultate. Extremities: No CCE. Neuro/Psych: Animated, moving about.  Able to answer questions appropriately with daughter acting as Nurse, learning disability.  Intake/Output from previous day: 11/28 0701 - 11/29 0700 In: 14 [IV Piggyback:14] Out: -   Intake/Output this shift: No intake/output data recorded.  Lab Results: Recent Labs    02/16/21 1328 02/17/21 0344 02/18/21 0116  WBC 12.3* 7.3 5.8  HGB 14.6 13.6 13.0   HCT 45.0 40.6 40.0  PLT 279 286 334   BMET Recent Labs    02/16/21 1328 02/17/21 0344 02/18/21 0116  NA 137 133* 140  K 4.1 3.3* 3.4*  CL 102 100 105  CO2 27 25 25   GLUCOSE 108* 113* 106*  BUN 21 14 13   CREATININE 0.85 0.88 0.89  CALCIUM 9.7 9.0 9.4   LFT Recent Labs    02/16/21 1328 02/17/21 0344 02/18/21 0116  PROT 7.2 6.5 6.5  ALBUMIN 3.3* 2.9* 2.9*  AST 277* 92* 41  ALT 108* 95* 67*  ALKPHOS 223* 179* 177*  BILITOT 1.5* 0.8 1.0   PT/INR Recent Labs    02/16/21 2044  LABPROT 13.8  INR 1.1   Hepatitis Panel Recent Labs    02/17/21 0344  HEPBSAG NON REACTIVE  HCVAB NON REACTIVE  HEPAIGM NON REACTIVE  HEPBIGM NON REACTIVE    Studies/Results: CT ABDOMEN PELVIS W CONTRAST  Result Date: 02/16/2021 CLINICAL DATA:  Right lower quadrant abdominal pain. EXAM: CT ABDOMEN AND PELVIS WITH CONTRAST TECHNIQUE: Multidetector CT imaging of the abdomen and pelvis was performed using the standard protocol following bolus administration of intravenous contrast. CONTRAST:  02/19/21 OMNIPAQUE IOHEXOL 300 MG/ML  SOLN COMPARISON:  Ultrasound abdomen 02/16/2021. FINDINGS: Lower chest: Right-sided fat containing Bochdalek hernia. Scarring in the lung bases. Hepatobiliary: There is diffuse marked gallbladder wall thickening and edema. There is submucosal enhancement with some areas of questionable wall irregularity internally. There is  no gallbladder air. Common bowel duct is dilated measuring up to 15 mm. No radiopaque gallstones are identified. There is fatty infiltration of the liver. Pancreas: Unremarkable. No pancreatic ductal dilatation or surrounding inflammatory changes. Spleen: Normal in size without focal abnormality. Adrenals/Urinary Tract: Small bladder diverticulum is present. The kidneys and adrenal glands are within normal limits. Stomach/Bowel: Stomach is within normal limits. Appendix appears normal. No evidence of bowel wall thickening, distention, or inflammatory  changes. Vascular/Lymphatic: Aortic atherosclerosis. No enlarged abdominal or pelvic lymph nodes. Reproductive: Uterus and bilateral adnexa are unremarkable. Prominent pelvic vessels are seen. Other: No abdominal wall hernia or abnormality. No abdominopelvic ascites. Musculoskeletal: Degenerative changes affect the spine. There is grade 1 anterolisthesis at L4-L5. IMPRESSION: 1. Marked wall thickening of the gallbladder with some areas of mucosal irregularity. Biliary ductal dilatation. Findings are highly suspicious for acute cholecystitis. Wall irregularity can be seen in the setting of gangrenous cholecystitis. Please correlate clinically. Surgical consultation recommended. 2. Small bladder diverticulum. 3. Prominent pelvic vessels, nonspecific. 4.  Aortic Atherosclerosis (ICD10-I70.0). Electronically Signed   By: Darliss Cheney M.D.   On: 02/16/2021 22:07   US Abdomen Limited RUQ (LIVER/GB)  Result Date: 02/16/2021 CLINICAL DATA:  Right upper quadrant pain. EXAM: ULTRASOUND ABDOMEN LIMITED RIGHT UPPER QUADRANT COMPARISON:  None. FINDINGS: Gallbladder: Mild to moderately distended. Wall is irregularly thickened linear areas wall edema. There is also pericholecystic fluid. Small amount of dependent sludge. One echogenic focus noted in the dependent gallbladder consistent with a polyp or nonshadowing stone, 5 mm in size. Patient is tender to transducer pressure over the gallbladder. Common bile duct: Diameter: 1 cm. No visualized duct stone. Most distal aspect of the duct was obscured by bowel gas. Liver: No focal lesion identified. Within normal limits in parenchymal echogenicity. Portal vein is patent on color Doppler imaging with normal direction of blood flow towards the liver. Other: None. IMPRESSION: 1. Abnormal gallbladder. Wall is irregularly thickened with wall edema, measuring up to 9 mm in thickness, associated with a small amount of pericholecystic fluid as well as a positive sonographic Murphy's  sign. However, no shadowing stones are noted. Findings support acute cholecystitis in the proper clinical setting. Findings could be further assessed with a nuclear medicine HIDA scan. 2. Dilated common bile duct to 1 cm.  No visualized duct stone. Electronically Signed   By: Amie [place] M.D.   On: 02/16/2021 15:51    Scheduled Meds:  enoxaparin (LOVENOX) injection  40 mg Subcutaneous Q24H   LORazepam  1 mg Intravenous Once   sodium chloride flush  3 mL Intravenous Q12H   Continuous Infusions:  sodium chloride     lactated ringers 100 mL/hr at 02/18/21 1059   piperacillin-tazobactam (ZOSYN)  IV 3.375 g (02/18/21 0648)   PRN Meds:.sodium chloride, ibuprofen, ondansetron **OR** ondansetron (ZOFRAN) IV, sodium chloride flush, traZODone   ASSESMENT:   Cholecystitis. Day  3 Zosyn  Dilated bile duct with no definite CBD stone per ultrasound or CT scan.  Elevated LFTs now normal or near normal.  Unable to perform MRCP due to patient agitation/unable to "sit still" for the study.  ?passed stone?    Hypokalemia of 3.4.     PLAN   Agrees that at this time proceeding with laparoscopic cholecystectomy with intraoperative cholangiogram is the best course..  If there are stones in the common duct on IOC, can proceed to postsurgical ERCP.       Jennye Moccasin  02/18/2021, 11:43 AM Phone [phone]

## 2021-02-18 NOTE — Progress Notes (Signed)
[facility]  PROGRESS NOTE  Zuriana Mcgurl [id number] DOB: 21-Jul-1935 DOA: 02/16/2021 PCP: Gifford Shave, MD   Brief HPI:   85 year old female with medical history of dementia, B12 deficiency came to ED with right upper quadrant pain.  She was seen in the ED on 11/24 with similar complaints for 1 day.  Lab work was normal at that time.  CT abdomen pelvis was attempted but patient pulled out And the left since no longer had pain.  She returned to the ED.  Complains of right upper quadrant pain.  Denies nausea vomiting or diarrhea. Right upper quadrant ultrasound showed normal gallbladder, irregularly thickened wall with edema measuring up to 9 mm in thickness, associated with small amount of pericholecystic fluid as well as positive Murphy sign.  General surgery was consulted.    Subjective   Patient seen and examined, s/p laparoscopic cholecystectomy for acute/gangrenous cholecystitis.  She also underwent IOC.   Assessment/Plan:     Right upper quadrant pain in setting of transaminitis -Abnormally thickened gallbladder seen on abdominal ultrasound -General surgery at initially recommended HIDA scan however it was canceled -MRCP ordered for today; but patient could not complete MRCP -General surgery took patient down for laparoscopic cholecystectomy and IOC. -Was found to have acutely inflamed gangrenous gallbladder, IOC revealed tapering of CBD without stone. -GI considering ERCP -LFTs have improved. -Continue IV Zosyn   Leukocytosis -Resolved, WBCs down to 7.3  Hypokalemia -Replace potassium and follow BMP in am  Dementia with behavior disturbance -Delirium precautions, daughter wants to stay with patient  B12 deficiency anemia -Recent B12 level on 10/01/2020 was within normal limits   Medications     enoxaparin (LOVENOX) injection  40 mg Subcutaneous Q24H   LORazepam  1 mg Intravenous Once   sodium chloride flush  3 mL Intravenous Q12H     Data  Reviewed:   CBG:  No results for input(s): GLUCAP in the last 168 hours.  SpO2: 99 % O2 Flow Rate (L/min): 2 L/min    Vitals:   02/18/21 1600 02/18/21 1615 02/18/21 1630 02/18/21 1645  BP: (!) 101/47 (!) 90/46 (!) 93/48 (!) 100/47  Pulse: (!) 56 (!) 53 (!) 59 (!) 59  Resp: 18 18 20 15   Temp: 97.6 F (36.4 C)   97.6 F (36.4 C)  TempSrc:      SpO2: 100% 100% 99% 99%  Height:         Intake/Output Summary (Last 24 hours) at 02/18/2021 1717 Last data filed at 02/18/2021 1648 Gross per 24 hour  Intake 1450 ml  Output 425 ml  Net 1025 ml    11/27 1901 - 11/29 0700 In: 14  Out: -   There were no vitals filed for this visit.  Data Reviewed: Basic Metabolic Panel: Recent Labs  Lab 02/13/21 1931 02/16/21 1328 02/17/21 0344 02/18/21 0116  NA 135 137 133* 140  K 4.4 4.1 3.3* 3.4*  CL 99 102 100 105  CO2 24 27 25 25   GLUCOSE 145* 108* 113* 106*  BUN 22 21 14 13   CREATININE 0.85 0.85 0.88 0.89  CALCIUM 10.2 9.7 9.0 9.4   Liver Function Tests: Recent Labs  Lab 02/13/21 1931 02/16/21 1328 02/17/21 0344 02/18/21 0116  AST 28 277* 92* 41  ALT 17 108* 95* 67*  ALKPHOS 69 223* 179* 177*  BILITOT 0.8 1.5* 0.8 1.0  PROT 7.5 7.2 6.5 6.5  ALBUMIN 4.1 3.3* 2.9* 2.9*   Recent Labs  Lab 02/13/21 1931 02/16/21 1328  LIPASE  31 31   No results for input(s): AMMONIA in the last 168 hours. CBC: Recent Labs  Lab 02/13/21 1931 02/16/21 1328 02/17/21 0344 02/18/21 0116  WBC 9.5 12.3* 7.3 5.8  NEUTROABS 8.5* 9.8*  --   --   HGB 15.6* 14.6 13.6 13.0  HCT 50.3* 45.0 40.6 40.0  MCV 91.3 90.4 87.3 88.9  PLT 305 279 286 334   Cardiac Enzymes: No results for input(s): CKTOTAL, CKMB, CKMBINDEX, TROPONINI in the last 168 hours. BNP (last 3 results) No results for input(s): BNP in the last 8760 hours.  ProBNP (last 3 results) No results for input(s): PROBNP in the last 8760 hours.  CBG: No results for input(s): GLUCAP in the last 168 hours.     Radiology  Reports  DG Cholangiogram Operative  Result Date: 02/18/2021 CLINICAL DATA:  Intraoperative cholangiogram during laparoscopic cholecystectomy. EXAM: INTRAOPERATIVE CHOLANGIOGRAM FLUOROSCOPY TIME:  16 seconds (3.2 mGy) COMPARISON:  CT abdomen and pelvis-02/16/2021 FINDINGS: Multiple spot intraoperative cholangiographic images of the right upper abdominal quadrant during laparoscopic cholecystectomy are provided for review. Surgical clips overlie the expected location of the gallbladder fossa. Contrast injection demonstrates selective cannulation of the central aspect of the cystic duct. There is extravasation of contrast about the cystic duct cannulation site there is however passage of contrast through the central aspect of the cystic duct with filling of a markedly dilated common bile duct. There is minimal reflux of injected contrast into the common hepatic duct and central aspect of the mildly dilated intrahepatic biliary system. There is no definitive passage of contrast to the level of the distal aspect of the CBD. There is no definitive opacification of the duodenum. IMPRESSION: No definitive passage of contrast through the distal aspect of the CBD - while potentially technique related, an ampullary stenosis/occlusion or occlusive choledocholithiasis could result in a similar appearance. Further evaluation and management with ERCP could be performed indicated. Electronically Signed   By: Simonne Come M.D.   On: 02/18/2021 14:58   CT ABDOMEN PELVIS W CONTRAST  Result Date: 02/16/2021 CLINICAL DATA:  Right lower quadrant abdominal pain. EXAM: CT ABDOMEN AND PELVIS WITH CONTRAST TECHNIQUE: Multidetector CT imaging of the abdomen and pelvis was performed using the standard protocol following bolus administration of intravenous contrast. CONTRAST:  OMNIPAQUE IOHEXOL 300 MG/ML  SOLN COMPARISON:  Ultrasound abdomen 02/16/2021. FINDINGS: Lower chest: Right-sided fat containing Bochdalek hernia. Scarring  in the lung bases. Hepatobiliary: There is diffuse marked gallbladder wall thickening and edema. There is submucosal enhancement with some areas of questionable wall irregularity internally. There is no gallbladder air. Common bowel duct is dilated measuring up to 15 mm. No radiopaque gallstones are identified. There is fatty infiltration of the liver. Pancreas: Unremarkable. No pancreatic ductal dilatation or surrounding inflammatory changes. Spleen: Normal in size without focal abnormality. Adrenals/Urinary Tract: Small bladder diverticulum is present. The kidneys and adrenal glands are within normal limits. Stomach/Bowel: Stomach is within normal limits. Appendix appears normal. No evidence of bowel wall thickening, distention, or inflammatory changes. Vascular/Lymphatic: Aortic atherosclerosis. No enlarged abdominal or pelvic lymph nodes. Reproductive: Uterus and bilateral adnexa are unremarkable. Prominent pelvic vessels are seen. Other: No abdominal wall hernia or abnormality. No abdominopelvic ascites. Musculoskeletal: Degenerative changes affect the spine. There is grade 1 anterolisthesis at L4-L5. IMPRESSION: 1. Marked wall thickening of the gallbladder with some areas of mucosal irregularity. Biliary ductal dilatation. Findings are highly suspicious for acute cholecystitis. Wall irregularity can be seen in the setting of gangrenous cholecystitis. Please correlate clinically.  Surgical consultation recommended. 2. Small bladder diverticulum. 3. Prominent pelvic vessels, nonspecific. 4.  Aortic Atherosclerosis (ICD10-I70.0). Electronically Signed   By: Ronney Asters M.D.   On: 02/16/2021 22:07       Antibiotics: Anti-infectives (From admission, onward)    Start     Dose/Rate Route Frequency Ordered Stop   02/17/21 1400  piperacillin-tazobactam (ZOSYN) IVPB 3.375 g        3.375 g 12.5 mL/hr over 240 Minutes Intravenous Every 8 hours 02/17/21 1028     02/16/21 1715  piperacillin-tazobactam (ZOSYN)  IVPB 3.375 g        3.375 g 100 mL/hr over 30 Minutes Intravenous  Once 02/16/21 1701 02/16/21 2217         DVT prophylaxis: Lovenox  Code Status: Full code  Family Communication: Spoke with daughter at bedside   Consultants: General surgery Gastroenterology  Procedures:     Objective    Physical Examination:   General-appears in no acute distress Heart-S1-S2, regular, no murmur auscultated Lungs-clear to auscultation bilaterally, no wheezing or crackles auscultated Abdomen-soft, nontender, no organomegaly Extremities-no edema in the lower extremities Neuro-somnolent but arousable  Status is: Inpatient  Dispo: The patient is from: Home              Anticipated d/c is to: Home              Anticipated d/c date is: 02/20/2021              Patient currently not stable for discharge  Barrier to discharge-ongoing evaluation for right upper quadrant pain  COVID-19 Labs  No results for input(s): DDIMER, FERRITIN, LDH, CRP in the last 72 hours.  Lab Results  Component Value Date   [place] NEGATIVE 02/16/2021            Recent Results (from the past 240 hour(s))  Resp Panel by RT-PCR (Flu A&B, Covid) Nasopharyngeal Swab     Status: None   Collection Time: 02/16/21 10:38 PM   Specimen: Nasopharyngeal Swab; Nasopharyngeal(NP) swabs in vial transport medium  Result Value Ref Range Status   SARS Coronavirus 2 by RT PCR NEGATIVE NEGATIVE Final    Comment: (NOTE) SARS-CoV-2 target nucleic acids are NOT DETECTED.  The SARS-CoV-2 RNA is generally detectable in upper respiratory specimens during the acute phase of infection. The lowest concentration of SARS-CoV-2 viral copies this assay can detect is 138 copies/mL. A negative result does not preclude SARS-Cov-2 infection and should not be used as the sole basis for treatment or other patient management decisions. A negative result may occur with  improper specimen collection/handling, submission of  specimen other than nasopharyngeal swab, presence of viral mutation(s) within the areas targeted by this assay, and inadequate number of viral copies(<138 copies/mL). A negative result must be combined with clinical observations, patient history, and epidemiological information. The expected result is Negative.  Fact Sheet for Patients:  EntrepreneurPulse.com.au  Fact Sheet for Healthcare Providers:  IncredibleEmployment.be  This test is no t yet approved or cleared by the [place] FDA and  has been authorized for detection and/or diagnosis of SARS-CoV-2 by FDA under an Emergency Use Authorization (EUA). This EUA will remain  in effect (meaning this test can be used) for the duration of the COVID-19 declaration under Section 564(b)(1) of the Act, 21 U.S.C.section 360bbb-3(b)(1), unless the authorization is terminated  or revoked sooner.       Influenza A by PCR NEGATIVE NEGATIVE Final   Influenza B by PCR NEGATIVE NEGATIVE Final  Comment: (NOTE) The Xpert Xpress SARS-CoV-2/FLU/RSV plus assay is intended as an aid in the diagnosis of influenza from Nasopharyngeal swab specimens and should not be used as a sole basis for treatment. Nasal washings and aspirates are unacceptable for Xpert Xpress SARS-CoV-2/FLU/RSV testing.  Fact Sheet for Patients: EntrepreneurPulse.com.au  Fact Sheet for Healthcare Providers: IncredibleEmployment.be  This test is not yet approved or cleared by the [place] FDA and has been authorized for detection and/or diagnosis of SARS-CoV-2 by FDA under an Emergency Use Authorization (EUA). This EUA will remain in effect (meaning this test can be used) for the duration of the COVID-19 declaration under Section 564(b)(1) of the Act, 21 U.S.C. section 360bbb-3(b)(1), unless the authorization is terminated or revoked.  Performed at [facility], [place] [address],  [place], [place] [address]     [facility] If 7PM-7AM, please contact night-coverage at [email or web address], Office  [phone]   02/18/2021, 5:17 PM  LOS: 1 day

## 2021-02-18 NOTE — Transfer of Care (Signed)
Immediate Anesthesia Transfer of Care Note  Patient: Ebony Pierce  Procedure(s) Performed: LAPAROSCOPIC CHOLECYSTECTOMY WITH INTRAOPERATIVE CHOLANGIOGRAM  Patient Location: PACU  Anesthesia Type:General  Level of Consciousness: drowsy  Airway & Oxygen Therapy: Patient Spontanous Breathing and Patient connected to face mask oxygen  Post-op Assessment: Report given to RN and Post -op Vital signs reviewed and stable  Post vital signs: Reviewed and stable  Last Vitals:  Vitals Value Taken Time  BP 101/47 02/18/21 1600  Temp 36.4 C 02/18/21 1600  Pulse 56 02/18/21 1604  Resp 18 02/18/21 1604  SpO2 100 % 02/18/21 1604  Vitals shown include unvalidated device data.  Last Pain:  Vitals:   02/18/21 1231  TempSrc: Oral  PainSc:          Complications: No notable events documented.

## 2021-02-18 NOTE — Progress Notes (Signed)
Pt returned to the unit.

## 2021-02-18 NOTE — Progress Notes (Signed)
Day of Surgery   Chief Complaint/Subjective: Abdominal pain with palpation, unable to tolerate MRCP overnight  Review of Systems See above, otherwise other systems negative   PMH -  has a past medical history of Age-related hearing loss (10/19/2016), Cataract, Dementia with behavioral disturbance (07/02/2015), Reduced vision (09/16/2016), and Weight loss, non-intentional (04/14/2018). PSH -  has a past surgical history that includes Cataract extraction (Right, 2012).  [facility] - family history includes Hypertension in her mother.   Objective: Vital signs in last 24 hours: Temp:  [97.7 F (36.5 C)-99.4 F (37.4 C)] 99.4 F (37.4 C) (11/29 1231) Pulse Rate:  [63-83] 83 (11/29 1231) Resp:  [16-18] 17 (11/29 1231) BP: (115-160)/(62-82) 160/72 (11/29 1231) SpO2:  [96 %-100 %] 98 % (11/29 1231) Last BM Date: 02/16/21 Intake/Output from previous day: 11/28 0701 - 11/29 0700 In: 14 [IV Piggyback:14] Out: -  Intake/Output this shift: No intake/output data recorded.  PE: Gen: NAD Resp: nonlabored Card: RRR Abd: tender to palpation in RUQ.  Lab Results:  Recent Labs    02/17/21 0344 02/18/21 0116  WBC 7.3 5.8  HGB 13.6 13.0  HCT 40.6 40.0  PLT 286 334   BMET Recent Labs    02/17/21 0344 02/18/21 0116  NA 133* 140  K 3.3* 3.4*  CL 100 105  CO2 25 25  GLUCOSE 113* 106*  BUN 14 13  CREATININE 0.88 0.89  CALCIUM 9.0 9.4   PT/INR Recent Labs    02/16/21 2044  LABPROT 13.8  INR 1.1   CMP     Component Value Date/Time   NA 140 02/18/2021 0116   NA 143 08/13/2020 1431   K 3.4 (L) 02/18/2021 0116   CL 105 02/18/2021 0116   CO2 25 02/18/2021 0116   GLUCOSE 106 (H) 02/18/2021 0116   BUN 13 02/18/2021 0116   BUN 15 08/13/2020 1431   CREATININE 0.89 02/18/2021 0116   CREATININE 0.83 06/18/2015 0845   CALCIUM 9.4 02/18/2021 0116   PROT 6.5 02/18/2021 0116   PROT 7.2 08/13/2020 1431   ALBUMIN 2.9 (L) 02/18/2021 0116   ALBUMIN 4.3 08/13/2020 1431   AST 41  02/18/2021 0116   ALT 67 (H) 02/18/2021 0116   ALKPHOS 177 (H) 02/18/2021 0116   BILITOT 1.0 02/18/2021 0116   BILITOT <0.2 08/13/2020 1431   GFRNONAA >60 02/18/2021 0116   GFRNONAA 68 06/18/2015 0845   GFRAA 66 09/12/2019 1724   GFRAA 79 06/18/2015 0845   Lipase     Component Value Date/Time   LIPASE 31 02/16/2021 1328    Studies/Results: CT ABDOMEN PELVIS W CONTRAST  Result Date: 02/16/2021 CLINICAL DATA:  Right lower quadrant abdominal pain. EXAM: CT ABDOMEN AND PELVIS WITH CONTRAST TECHNIQUE: Multidetector CT imaging of the abdomen and pelvis was performed using the standard protocol following bolus administration of intravenous contrast. CONTRAST:  OMNIPAQUE IOHEXOL 300 MG/ML  SOLN COMPARISON:  Ultrasound abdomen 02/16/2021. FINDINGS: Lower chest: Right-sided fat containing Bochdalek hernia. Scarring in the lung bases. Hepatobiliary: There is diffuse marked gallbladder wall thickening and edema. There is submucosal enhancement with some areas of questionable wall irregularity internally. There is no gallbladder air. Common bowel duct is dilated measuring up to 15 mm. No radiopaque gallstones are identified. There is fatty infiltration of the liver. Pancreas: Unremarkable. No pancreatic ductal dilatation or surrounding inflammatory changes. Spleen: Normal in size without focal abnormality. Adrenals/Urinary Tract: Small bladder diverticulum is present. The kidneys and adrenal glands are within normal limits. Stomach/Bowel: Stomach is within normal limits.  Appendix appears normal. No evidence of bowel wall thickening, distention, or inflammatory changes. Vascular/Lymphatic: Aortic atherosclerosis. No enlarged abdominal or pelvic lymph nodes. Reproductive: Uterus and bilateral adnexa are unremarkable. Prominent pelvic vessels are seen. Other: No abdominal wall hernia or abnormality. No abdominopelvic ascites. Musculoskeletal: Degenerative changes affect the spine. There is grade 1  anterolisthesis at L4-L5. IMPRESSION: 1. Marked wall thickening of the gallbladder with some areas of mucosal irregularity. Biliary ductal dilatation. Findings are highly suspicious for acute cholecystitis. Wall irregularity can be seen in the setting of gangrenous cholecystitis. Please correlate clinically. Surgical consultation recommended. 2. Small bladder diverticulum. 3. Prominent pelvic vessels, nonspecific. 4.  Aortic Atherosclerosis (ICD10-I70.0). Electronically Signed   By: Darliss Cheney M.D.   On: 02/16/2021 22:07   US Abdomen Limited RUQ (LIVER/GB)  Result Date: 02/16/2021 CLINICAL DATA:  Right upper quadrant pain. EXAM: ULTRASOUND ABDOMEN LIMITED RIGHT UPPER QUADRANT COMPARISON:  None. FINDINGS: Gallbladder: Mild to moderately distended. Wall is irregularly thickened linear areas wall edema. There is also pericholecystic fluid. Small amount of dependent sludge. One echogenic focus noted in the dependent gallbladder consistent with a polyp or nonshadowing stone, 5 mm in size. Patient is tender to transducer pressure over the gallbladder. Common bile duct: Diameter: 1 cm. No visualized duct stone. Most distal aspect of the duct was obscured by bowel gas. Liver: No focal lesion identified. Within normal limits in parenchymal echogenicity. Portal vein is patent on color Doppler imaging with normal direction of blood flow towards the liver. Other: None. IMPRESSION: 1. Abnormal gallbladder. Wall is irregularly thickened with wall edema, measuring up to 9 mm in thickness, associated with a small amount of pericholecystic fluid as well as a positive sonographic Murphy's sign. However, no shadowing stones are noted. Findings support acute cholecystitis in the proper clinical setting. Findings could be further assessed with a nuclear medicine HIDA scan. 2. Dilated common bile duct to 1 cm.  No visualized duct stone. Electronically Signed   By: Amie Portland M.D.   On: 02/16/2021 15:51     Anti-infectives: Anti-infectives (From admission, onward)    Start     Dose/Rate Route Frequency Ordered Stop   02/17/21 1400  [MAR Hold]  piperacillin-tazobactam (ZOSYN) IVPB 3.375 g        (MAR Hold since Tue 02/18/2021 at 1214.Hold Reason: Transfer to a Procedural area)   3.375 g 12.5 mL/hr over 240 Minutes Intravenous Every 8 hours 02/17/21 1028     02/16/21 1715  piperacillin-tazobactam (ZOSYN) IVPB 3.375 g        3.375 g 100 mL/hr over 30 Minutes Intravenous  Once 02/16/21 1701 02/16/21 2217       Assessment/Plan Persistent abdominal pain and wall thickening concerning for cholecystitis. Initial concern for choledocholithiasis but labs improved. -lap chole w ioc -We discussed the etiology of her pain, we discussed treatment options and recommended surgery. We discussed details of surgery including general anesthesia, laparoscopic approach, identification of cystic duct and common bile duct. Ligation of cystic duct and cystic artery. Possible need for intraoperative cholangiogram or open procedure. Possible risks of common bile duct injury, liver injury, cystic duct leak, bleeding, infection, post-cholecystectomy syndrome. The patient's family showed good understanding and all questions were answered   FEN - NPO for procedure VTE - lovenox prophylaxis ID - [id number] Disposition - to OR today for gallbladder removal   LOS: 1 day   De Blanch [facility] 02/18/2021, 12:43 PM Please see Amion for pager number during day hours 7:00am-4:30pm or 7:00am -  11:30am on weekends

## 2021-02-18 NOTE — Anesthesia Preprocedure Evaluation (Addendum)
Anesthesia Evaluation  Patient identified by MRN, date of birth, ID band Patient awake    Reviewed: Allergy & Precautions, Patient's Chart, lab work & pertinent test results  Airway Mallampati: II  TM Distance: >3 FB Neck ROM: Full    Dental no notable dental hx.    Pulmonary neg pulmonary ROS,    Pulmonary exam normal        Cardiovascular negative cardio ROS   Rhythm:Regular Rate:Normal     Neuro/Psych PSYCHIATRIC DISORDERS Dementia negative neurological ROS     GI/Hepatic negative GI ROS, Neg liver ROS,   Endo/Other  negative endocrine ROS  Renal/GU negative Renal ROS     Musculoskeletal negative musculoskeletal ROS (+)   Abdominal Normal abdominal exam  (+)   Peds  Hematology negative hematology ROS (+)   Anesthesia Other Findings   Reproductive/Obstetrics                            Anesthesia Physical Anesthesia Plan  ASA: 3  Anesthesia Plan: General   Post-op Pain Management: Celebrex PO (pre-op) and Tylenol PO (pre-op)   Induction: Intravenous  PONV Risk Score and Plan: 4 or greater and Ondansetron, Dexamethasone and Treatment may vary due to age or medical condition  Airway Management Planned: Oral ETT  Additional Equipment:   Intra-op Plan:   Post-operative Plan: Extubation in OR  Informed Consent: I have reviewed the patients History and Physical, chart, labs and discussed the procedure including the risks, benefits and alternatives for the proposed anesthesia with the patient or authorized representative who has indicated his/her understanding and acceptance.     Dental advisory given  Plan Discussed with: Anesthesiologist  Anesthesia Plan Comments:        Anesthesia Quick Evaluation

## 2021-02-18 NOTE — Op Note (Signed)
PATIENT:  Ebony Pierce  85 y.o. female  PRE-OPERATIVE DIAGNOSIS:  acute cholcystitis  POST-OPERATIVE DIAGNOSIS:  acute cholecystitis, choledocholithiasis  PROCEDURE:  Procedure(s): LAPAROSCOPIC CHOLECYSTECTOMY WITH INTRAOPERATIVE CHOLANGIOGRAM   SURGEON:  Kimberl Vig, De Blanch, MD   ASSISTANT: Violeta Gelinas, M.D.  ANESTHESIA:   local and general  Indications for procedure: Ebony Pierce is a 85 y.o. female with symptoms of Abdominal pain and Nausea and vomiting consistent with gallbladder disease, Confirmed by ultrasound and CT.  Description of procedure: The patient was brought into the operative suite, placed supine. Anesthesia was administered with endotracheal tube. Patient was strapped in place and foot board was secured. All pressure points were offloaded by foam padding. The patient was prepped and draped in the usual sterile fashion.  A left subcostal incision was made and optical entry was used to enter the abdomen. 2 5 mm trocars were placed on in the right lateral space on in the right subcostal space. A 10mm trocar was placed in the supraumbilical space. Marcaine was infused to the subxiphoid space and lateral upper right abdomen in the transversus abdominis plane. Next the patient was placed in reverse trendelenberg. The gallbladder appeareddilated, acutely inflamed, and gangrenous. Omentum was adhered to the gallbladder and was taken down with cautery/blunt dissection.  The gallbladder was retracted cephalad and lateral. The peritoneum was reflected off the infundibulum working lateral to medial. The cystic duct and cystic artery were identified and further dissection revealed a critical view, due to concern for choledocholithiasis a cholangiogram was performed with ductotomy and cook catheter passed through a separate subcostal stab incision. IOC showed dilated bile duct with concern for round structure at the ampulla and no flow of the contrast into the duodenum. The  cystic duct and cystic artery were doubly clipped and ligated.   The gallbladder was removed off the liver bed with cautery. The Gallbladder was placed in a specimen bag. The gallbladder fossa was irrigated and hemostasis was applied with cautery. The gallbladder was removed via the 75mm trocar. There was a difficult to control artery in the liver bed that was eventually controlled with cautery after arrixstra and clips were unsuccessful. Dr. Janee Morn was called in to help with control of the bleeding. A 19 fr blake drain was placed with the tip in the gallbladder fossa and brought out the right lateral port site and sutured in place with a 2-0 Nylon and The fascial defect was closed with interrupted 0 vicryl suture via laparoscopic trans-fascial suture passer. Pneumoperitoneum was removed, all trocar were removed. All incisions were closed with 4-0 monocryl subcuticular stitch. The patient woke from anesthesia and was brought to PACU in stable condition. All counts were correct  Findings: acute cholecystitis, choledocholithiasis  Specimen: gallbladder  Blood loss: 200 ml  Local anesthesia: 20 ml Marcaine  Complications: none  PLAN OF CARE: Admit to inpatient   PATIENT DISPOSITION:  PACU - hemodynamically stable.  De Blanch [facility], [place]

## 2021-02-18 NOTE — Anesthesia Procedure Notes (Signed)
Procedure Name: Intubation Date/Time: 02/18/2021 1:38 PM Performed by: Carolan Clines, CRNA Pre-anesthesia Checklist: Patient identified, Emergency Drugs available, Suction available and Patient being monitored Patient Re-evaluated:Patient Re-evaluated prior to induction Oxygen Delivery Method: Circle System Utilized Preoxygenation: Pre-oxygenation with 100% oxygen Induction Type: IV induction and Rapid sequence Ventilation: Mask ventilation without difficulty Laryngoscope Size: Mac and 3 Grade View: Grade I Tube type: Oral Tube size: 7.0 mm Number of attempts: 1 Airway Equipment and Method: Stylet Placement Confirmation: ETT inserted through vocal cords under direct vision, positive ETCO2 and breath sounds checked- equal and bilateral Secured at: 21 cm Tube secured with: Tape Dental Injury: Teeth and Oropharynx as per pre-operative assessment

## 2021-02-19 ENCOUNTER — Encounter (HOSPITAL_COMMUNITY): Payer: Self-pay | Admitting: General Surgery

## 2021-02-19 DIAGNOSIS — R748 Abnormal levels of other serum enzymes: Secondary | ICD-10-CM | POA: Diagnosis not present

## 2021-02-19 DIAGNOSIS — R1011 Right upper quadrant pain: Secondary | ICD-10-CM | POA: Diagnosis not present

## 2021-02-19 DIAGNOSIS — K810 Acute cholecystitis: Secondary | ICD-10-CM

## 2021-02-19 DIAGNOSIS — D518 Other vitamin B12 deficiency anemias: Secondary | ICD-10-CM | POA: Diagnosis not present

## 2021-02-19 DIAGNOSIS — F03918 Unspecified dementia, unspecified severity, with other behavioral disturbance: Secondary | ICD-10-CM | POA: Diagnosis not present

## 2021-02-19 LAB — COMPREHENSIVE METABOLIC PANEL
ALT: 77 U/L — ABNORMAL HIGH (ref 0–44)
ALT: 72 U/L — ABNORMAL HIGH (ref 0–44)
AST: 95 U/L — ABNORMAL HIGH (ref 15–41)
AST: 69 U/L — ABNORMAL HIGH (ref 15–41)
Albumin: 2.7 g/dL — ABNORMAL LOW (ref 3.5–5.0)
Albumin: 2.7 g/dL — ABNORMAL LOW (ref 3.5–5.0)
Alkaline Phosphatase: 134 U/L — ABNORMAL HIGH (ref 38–126)
Alkaline Phosphatase: 131 U/L — ABNORMAL HIGH (ref 38–126)
Anion gap: 10 (ref 5–15)
Anion gap: 10 (ref 5–15)
BUN: 11 mg/dL (ref 8–23)
BUN: 7 mg/dL — ABNORMAL LOW (ref 8–23)
CO2: 20 mmol/L — ABNORMAL LOW (ref 22–32)
CO2: 22 mmol/L (ref 22–32)
Calcium: 8.8 mg/dL — ABNORMAL LOW (ref 8.9–10.3)
Calcium: 8.8 mg/dL — ABNORMAL LOW (ref 8.9–10.3)
Chloride: 106 mmol/L (ref 98–111)
Chloride: 102 mmol/L (ref 98–111)
Creatinine, Ser: 0.75 mg/dL (ref 0.44–1.00)
Creatinine, Ser: 0.77 mg/dL (ref 0.44–1.00)
GFR, Estimated: >60 mL/min (ref >60)
GFR, Estimated: >60 mL/min (ref >60)
Glucose, Bld: 120 mg/dL — ABNORMAL HIGH (ref 70–99)
Glucose, Bld: 152 mg/dL — ABNORMAL HIGH (ref 70–99)
Potassium: 3.7 mmol/L (ref 3.5–5.1)
Potassium: 3.9 mmol/L (ref 3.5–5.1)
Sodium: 136 mmol/L (ref 135–145)
Sodium: 134 mmol/L — ABNORMAL LOW (ref 135–145)
Total Bilirubin: 1 mg/dL (ref 0.3–1.2)
Total Bilirubin: 0.8 mg/dL (ref 0.3–1.2)
Total Protein: 5.6 g/dL — ABNORMAL LOW (ref 6.5–8.1)
Total Protein: 5.9 g/dL — ABNORMAL LOW (ref 6.5–8.1)

## 2021-02-19 LAB — CBC
HCT: 38.8 % (ref 36.0–46.0)
Hemoglobin: 12.3 g/dL (ref 12.0–15.0)
MCH: 29.3 pg (ref 26.0–34.0)
MCHC: 31.7 g/dL (ref 30.0–36.0)
MCV: 92.4 fL (ref 80.0–100.0)
Platelets: 275 10*3/uL (ref 150–400)
RBC: 4.2 MIL/uL (ref 3.87–5.11)
RDW: 13.6 % (ref 11.5–15.5)
WBC: 10.5 10*3/uL (ref 4.0–10.5)
nRBC: 0 % (ref 0.0–0.2)

## 2021-02-19 MED ORDER — FENTANYL CITRATE PF 50 MCG/ML IJ SOSY
12.5000 ug | PREFILLED_SYRINGE | INTRAMUSCULAR | Status: DC | PRN
Start: 2021-02-19 — End: 2021-02-28
  Administered 2021-02-19 – 2021-02-21 (×12): 25 ug via INTRAVENOUS
  Filled 2021-02-19 (×13): qty 1

## 2021-02-19 MED ORDER — ACETAMINOPHEN 500 MG PO TABS
1000.0000 mg | ORAL_TABLET | Freq: Four times a day (QID) | ORAL | Status: DC
  Administered 2021-02-19 – 2021-02-28 (×20): 1000 mg via ORAL
  Filled 2021-02-19 (×24): qty 2

## 2021-02-19 MED ORDER — LIP MEDEX EX OINT
TOPICAL_OINTMENT | CUTANEOUS | Status: DC | PRN
  Filled 2021-02-19: qty 7

## 2021-02-19 MED ORDER — MELATONIN 3 MG PO TABS
3.0000 mg | ORAL_TABLET | Freq: Every day | ORAL | Status: DC
  Administered 2021-02-19 – 2021-02-27 (×6): 3 mg via ORAL
  Filled 2021-02-19 (×8): qty 1

## 2021-02-19 MED ORDER — QUETIAPINE FUMARATE 25 MG PO TABS
25.0000 mg | ORAL_TABLET | Freq: Every evening | ORAL | Status: DC | PRN
  Administered 2021-02-19 – 2021-02-21 (×2): 25 mg via ORAL
  Filled 2021-02-19 (×2): qty 1

## 2021-02-19 MED ORDER — OXYCODONE HCL 5 MG PO TABS
5.0000 mg | ORAL_TABLET | ORAL | Status: DC | PRN
  Administered 2021-02-21 – 2021-02-28 (×11): 5 mg via ORAL
  Filled 2021-02-19 (×12): qty 1

## 2021-02-19 MED ORDER — ENOXAPARIN SODIUM 40 MG/0.4ML IJ SOSY
40.0000 mg | PREFILLED_SYRINGE | INTRAMUSCULAR | Status: DC
  Administered 2021-02-21 – 2021-02-25 (×5): 40 mg via SUBCUTANEOUS
  Filled 2021-02-19 (×6): qty 0.4

## 2021-02-19 MED ORDER — DEXTROSE-NACL 5-0.45 % IV SOLN: INTRAVENOUS | Status: AC

## 2021-02-19 MED ORDER — METHOCARBAMOL 1000 MG/10ML IJ SOLN: 500.0000 mg | Freq: Four times a day (QID) | INTRAVENOUS | Status: DC | PRN

## 2021-02-19 NOTE — Anesthesia Postprocedure Evaluation (Signed)
Anesthesia Post Note  Patient: Ebony Pierce  Procedure(s) Performed: LAPAROSCOPIC CHOLECYSTECTOMY WITH INTRAOPERATIVE CHOLANGIOGRAM     Patient location during evaluation: PACU Anesthesia Type: General Level of consciousness: awake and alert Pain management: pain level controlled Vital Signs Assessment: post-procedure vital signs reviewed and stable Respiratory status: spontaneous breathing, nonlabored ventilation, respiratory function stable and patient connected to nasal cannula oxygen Cardiovascular status: blood pressure returned to baseline and stable Postop Assessment: no apparent nausea or vomiting Anesthetic complications: no   No notable events documented.  Last Vitals:  Vitals:   02/19/21 0804 02/19/21 1500  BP: (!) 93/49 117/74  Pulse: 69 75  Resp: 14 16  Temp: 36.6 C 37 C  SpO2: 94% 95%    Last Pain:  Vitals:   02/19/21 1500  TempSrc: Oral  PainSc:                  Nelle Don Leor Whyte

## 2021-02-19 NOTE — Progress Notes (Signed)
1 Day Post-Op   Chief Complaint/Subjective: Poor pain control overnight.   Review of Systems See above, otherwise other systems negative   PMH -  has a past medical history of Age-related hearing loss (10/19/2016), Cataract, Dementia with behavioral disturbance (07/02/2015), Reduced vision (09/16/2016), and Weight loss, non-intentional (04/14/2018). PSH -  has a past surgical history that includes Cataract extraction (Right, 2012).  [facility] - family history includes Hypertension in her mother.   Objective: Vital signs in last 24 hours: Temp:  [97.6 F (36.4 C)-99.4 F (37.4 C)] 98.1 F (36.7 C) (11/30 0502) Pulse Rate:  [53-83] 79 (11/30 0502) Resp:  [15-20] 15 (11/30 0502) BP: (90-160)/(46-80) 91/50 (11/30 0502) SpO2:  [98 %-100 %] 98 % (11/30 0502) Last BM Date: 02/16/21 Intake/Output from previous day: 11/29 0701 - 11/30 0700 In: 2098.3 [I.V.:1848.3; IV Piggyback:250] Out: 750 [Urine:350; Drains:200; Blood:200] Intake/Output this shift: No intake/output data recorded.  PE: Gen: groaning with pain Resp: nonlabored Card: RRR Abd: diffusely tender. Incisions c/d/I with dermabond, minimal evolving ecchymoses but no cellulitis or hematoma. JP output sanguinous- 200 cc since surgery and about 40 in the bulb now  Lab Results:  Recent Labs    02/18/21 0116 02/18/21 1823 02/19/21 0328  WBC 5.8  --  10.5  HGB 13.0 12.9 12.3  HCT 40.0 41.1 38.8  PLT 334  --  275   BMET Recent Labs    02/18/21 0116 02/19/21 0328  NA 140 136  K 3.4* 3.7  CL 105 106  CO2 25 20*  GLUCOSE 106* 120*  BUN 13 11  CREATININE 0.89 0.75  CALCIUM 9.4 8.8*   PT/INR Recent Labs    02/16/21 2044  LABPROT 13.8  INR 1.1   CMP     Component Value Date/Time   NA 136 02/19/2021 0328   NA 143 08/13/2020 1431   K 3.7 02/19/2021 0328   CL 106 02/19/2021 0328   CO2 20 (L) 02/19/2021 0328   GLUCOSE 120 (H) 02/19/2021 0328   BUN 11 02/19/2021 0328   BUN 15 08/13/2020 1431   CREATININE 0.75  02/19/2021 0328   CREATININE 0.83 06/18/2015 0845   CALCIUM 8.8 (L) 02/19/2021 0328   PROT 5.6 (L) 02/19/2021 0328   PROT 7.2 08/13/2020 1431   ALBUMIN 2.7 (L) 02/19/2021 0328   ALBUMIN 4.3 08/13/2020 1431   AST 95 (H) 02/19/2021 0328   ALT 77 (H) 02/19/2021 0328   ALKPHOS 134 (H) 02/19/2021 0328   BILITOT 1.0 02/19/2021 0328   BILITOT <0.2 08/13/2020 1431   GFRNONAA >60 02/19/2021 0328   GFRNONAA 68 06/18/2015 0845   GFRAA 66 09/12/2019 1724   GFRAA 79 06/18/2015 0845   Lipase     Component Value Date/Time   LIPASE 31 02/16/2021 1328    Studies/Results: DG Cholangiogram Operative  Result Date: 02/18/2021 CLINICAL DATA:  Intraoperative cholangiogram during laparoscopic cholecystectomy. EXAM: INTRAOPERATIVE CHOLANGIOGRAM FLUOROSCOPY TIME:  16 seconds (3.2 mGy) COMPARISON:  CT abdomen and pelvis-02/16/2021 FINDINGS: Multiple spot intraoperative cholangiographic images of the right upper abdominal quadrant during laparoscopic cholecystectomy are provided for review. Surgical clips overlie the expected location of the gallbladder fossa. Contrast injection demonstrates selective cannulation of the central aspect of the cystic duct. There is extravasation of contrast about the cystic duct cannulation site there is however passage of contrast through the central aspect of the cystic duct with filling of a markedly dilated common bile duct. There is minimal reflux of injected contrast into the common hepatic duct and central aspect of  the mildly dilated intrahepatic biliary system. There is no definitive passage of contrast to the level of the distal aspect of the CBD. There is no definitive opacification of the duodenum. IMPRESSION: No definitive passage of contrast through the distal aspect of the CBD - while potentially technique related, an ampullary stenosis/occlusion or occlusive choledocholithiasis could result in a similar appearance. Further evaluation and management with ERCP could be  performed indicated. Electronically Signed   By: Simonne Come M.D.   On: 02/18/2021 14:58    Anti-infectives: Anti-infectives (From admission, onward)    Start     Dose/Rate Route Frequency Ordered Stop   02/17/21 1400  piperacillin-tazobactam (ZOSYN) IVPB 3.375 g        3.375 g 12.5 mL/hr over 240 Minutes Intravenous Every 8 hours 02/17/21 1028     02/16/21 1715  piperacillin-tazobactam (ZOSYN) IVPB 3.375 g        3.375 g 100 mL/hr over 30 Minutes Intravenous  Once 02/16/21 1701 02/16/21 2217       Assessment/Plan Persistent abdominal pain and wall thickening concerning for cholecystitis. Initial concern for choledocholithiasis but labs improved. -lap chole 11/29 Dr. Sheliah Hatch- "IOC showed dilated bile duct with concern for round structure at the ampulla and no flow of the contrast into the duodenum" -Appreciate further follow up from GI, anticipate need for ERCP though total bili currently normal -Work on pain control today, mobilize  FEN - NPO for procedure VTE - lovenox prophylaxis ID - [id number] Disposition - pending further treatment   LOS: 2 days   Lady Deutscher [facility] Surgery 02/19/2021, 7:53 AM Please see Amion to page floor coverage

## 2021-02-19 NOTE — Progress Notes (Signed)
[facility] PROGRESS NOTE    Progress Note  Ebony Pierce  [id number] DOB: 04-27-1935 DOA: 02/16/2021 PCP: Gifford Shave, MD     Brief Narrative:   Ebony Pierce is an 85 y.o. female past medical history significant of dementia, vitamin D B12 deficiency who comes into the ED for right upper quadrant pain was seen in the ED on 02/13/2021 with similar complaints labs were unremarkable.  She returned to the ED with the same complaint right upper quadrant ultrasound showed normal gallbladder irregular thickening of the wall and edema with up to 9 mm associated with pericolic cystic fluid positive Murphy sign surgery was consulted she is status post surgical intervention on 02/18/2021    Assessment/Plan:   RUQ pain in the setting of transaminitis concern for cholecystitis. Status post lap chole on 02/18/2021 with inflamed gangrenous gallbladder bile duct there was a concern for round stricture at the ampulla and no flow of contrast into the duodenum. GI is on board anticipate ERCP, has remained afebrile with no leukocytosis. Continue IV antibiotics Zosyn, keep the patient NPO. Daily on narcotics for analgesics.  Leukocytosis: Resolved this morning is 10 check a CBC with differential tomorrow morning.  Hypokalemia: Replete orally 3.7 today.  Dementia with behavioral disturbances: At high risk of aspiration and delirium monitor closely.  Vitamin B12 deficiency: Checked this year it was stable.   DVT prophylaxis: lovenox Family Communication:none Status is: Inpatient  Remains inpatient appropriate because: Acute cholecystitis which will not require ERCP.        Code Status:     Code Status Orders  (From admission, onward)           Start     Ordered   02/16/21 1806  Full code  Continuous        02/16/21 1809           Code Status History     This patient has a current code status but no historical code status.         IV  Access:   Peripheral IV   Procedures and diagnostic studies:   DG Cholangiogram Operative  Result Date: 02/18/2021 CLINICAL DATA:  Intraoperative cholangiogram during laparoscopic cholecystectomy. EXAM: INTRAOPERATIVE CHOLANGIOGRAM FLUOROSCOPY TIME:  16 seconds (3.2 mGy) COMPARISON:  CT abdomen and pelvis-02/16/2021 FINDINGS: Multiple spot intraoperative cholangiographic images of the right upper abdominal quadrant during laparoscopic cholecystectomy are provided for review. Surgical clips overlie the expected location of the gallbladder fossa. Contrast injection demonstrates selective cannulation of the central aspect of the cystic duct. There is extravasation of contrast about the cystic duct cannulation site there is however passage of contrast through the central aspect of the cystic duct with filling of a markedly dilated common bile duct. There is minimal reflux of injected contrast into the common hepatic duct and central aspect of the mildly dilated intrahepatic biliary system. There is no definitive passage of contrast to the level of the distal aspect of the CBD. There is no definitive opacification of the duodenum. IMPRESSION: No definitive passage of contrast through the distal aspect of the CBD - while potentially technique related, an ampullary stenosis/occlusion or occlusive choledocholithiasis could result in a similar appearance. Further evaluation and management with ERCP could be performed indicated. Electronically Signed   By: Sandi Mariscal M.D.   On: 02/18/2021 14:58     Medical Consultants:   None.   Subjective:    Ebony Pierce complaining of pain.  Objective:    Vitals:   02/18/21 1645 02/18/21  1957 02/19/21 0502 02/19/21 0804  BP: (!) 100/47 101/80 (!) 91/50 (!) 93/49  Pulse: (!) 59 65 79 69  Resp: 15 15 15    Temp: 97.6 F (36.4 C) 98.3 F (36.8 C) 98.1 F (36.7 C) 97.9 F (36.6 C)  TempSrc:  Oral Oral Oral  SpO2: 99% 100% 98% 94%  Height:        SpO2: 94 % O2 Flow Rate (L/min): 2 L/min   Intake/Output Summary (Last 24 hours) at 02/19/2021 1031 Last data filed at 02/19/2021 0849 Gross per 24 hour  Intake 2098.33 ml  Output 800 ml  Net 1298.33 ml   There were no vitals filed for this visit.  Exam: General exam: In no acute distress. Respiratory system: Good air movement and clear to auscultation. Cardiovascular system: S1 & S2 heard, RRR. No JVD. Gastrointestinal system: Soft tender with drain in place. Extremities: No pedal edema. Skin: No rashes, lesions or ulcers  Data Reviewed:    Labs: Basic Metabolic Panel: Recent Labs  Lab 02/13/21 1931 02/16/21 1328 02/17/21 0344 02/18/21 0116 02/19/21 0328  NA 135 137 133* 140 136  K 4.4 4.1 3.3* 3.4* 3.7  CL 99 102 100 105 106  CO2 24 27 25 25  20*  GLUCOSE 145* 108* 113* 106* 120*  BUN 22 21 14 13 11   CREATININE 0.85 0.85 0.88 0.89 0.75  CALCIUM 10.2 9.7 9.0 9.4 8.8*   GFR Estimated Creatinine Clearance: 45.7 mL/min (by C-G formula based on SCr of 0.75 mg/dL). Liver Function Tests: Recent Labs  Lab 02/13/21 1931 02/16/21 1328 02/17/21 0344 02/18/21 0116 02/19/21 0328  AST 28 277* 92* 41 95*  ALT 17 108* 95* 67* 77*  ALKPHOS 69 223* 179* 177* 134*  BILITOT 0.8 1.5* 0.8 1.0 1.0  PROT 7.5 7.2 6.5 6.5 5.6*  ALBUMIN 4.1 3.3* 2.9* 2.9* 2.7*   Recent Labs  Lab 02/13/21 1931 02/16/21 1328  LIPASE 31 31   No results for input(s): AMMONIA in the last 168 hours. Coagulation profile Recent Labs  Lab 02/16/21 2044  INR 1.1   COVID-19 Labs  No results for input(s): DDIMER, FERRITIN, LDH, CRP in the last 72 hours.  Lab Results  Component Value Date   [place] NEGATIVE 02/16/2021    CBC: Recent Labs  Lab 02/13/21 1931 02/16/21 1328 02/17/21 0344 02/18/21 0116 02/18/21 1823 02/19/21 0328  WBC 9.5 12.3* 7.3 5.8  --  10.5  NEUTROABS 8.5* 9.8*  --   --   --   --   HGB 15.6* 14.6 13.6 13.0 12.9 12.3  HCT 50.3* 45.0 40.6 40.0 41.1 38.8   MCV 91.3 90.4 87.3 88.9  --  92.4  PLT 305 279 286 334  --  275   Cardiac Enzymes: No results for input(s): CKTOTAL, CKMB, CKMBINDEX, TROPONINI in the last 168 hours. BNP (last 3 results) No results for input(s): PROBNP in the last 8760 hours. CBG: No results for input(s): GLUCAP in the last 168 hours. D-Dimer: No results for input(s): DDIMER in the last 72 hours. Hgb A1c: No results for input(s): HGBA1C in the last 72 hours. Lipid Profile: No results for input(s): CHOL, HDL, LDLCALC, TRIG, CHOLHDL, LDLDIRECT in the last 72 hours. Thyroid function studies: No results for input(s): TSH, T4TOTAL, T3FREE, THYROIDAB in the last 72 hours.  Invalid input(s): FREET3 Anemia work up: No results for input(s): VITAMINB12, FOLATE, FERRITIN, TIBC, IRON, RETICCTPCT in the last 72 hours. Sepsis Labs: Recent Labs  Lab 02/16/21 1328 02/17/21 0344 02/18/21 0116 02/19/21  0328  WBC 12.3* 7.3 5.8 10.5   Microbiology Recent Results (from the past 240 hour(s))  Resp Panel by RT-PCR (Flu A&B, Covid) Nasopharyngeal Swab     Status: None   Collection Time: 02/16/21 10:38 PM   Specimen: Nasopharyngeal Swab; Nasopharyngeal(NP) swabs in vial transport medium  Result Value Ref Range Status   SARS Coronavirus 2 by RT PCR NEGATIVE NEGATIVE Final    Comment: (NOTE) SARS-CoV-2 target nucleic acids are NOT DETECTED.  The SARS-CoV-2 RNA is generally detectable in upper respiratory specimens during the acute phase of infection. The lowest concentration of SARS-CoV-2 viral copies this assay can detect is 138 copies/mL. A negative result does not preclude SARS-Cov-2 infection and should not be used as the sole basis for treatment or other patient management decisions. A negative result may occur with  improper specimen collection/handling, submission of specimen other than nasopharyngeal swab, presence of viral mutation(s) within the areas targeted by this assay, and inadequate number of  viral copies(<138 copies/mL). A negative result must be combined with clinical observations, patient history, and epidemiological information. The expected result is Negative.  Fact Sheet for Patients:  BloggerCourse.com  Fact Sheet for Healthcare Providers:  SeriousBroker.it  This test is no t yet approved or cleared by the [place] FDA and  has been authorized for detection and/or diagnosis of SARS-CoV-2 by FDA under an Emergency Use Authorization (EUA). This EUA will remain  in effect (meaning this test can be used) for the duration of the COVID-19 declaration under Section 564(b)(1) of the Act, 21 U.S.C.section 360bbb-3(b)(1), unless the authorization is terminated  or revoked sooner.       Influenza A by PCR NEGATIVE NEGATIVE Final   Influenza B by PCR NEGATIVE NEGATIVE Final    Comment: (NOTE) The Xpert Xpress SARS-CoV-2/FLU/RSV plus assay is intended as an aid in the diagnosis of influenza from Nasopharyngeal swab specimens and should not be used as a sole basis for treatment. Nasal washings and aspirates are unacceptable for Xpert Xpress SARS-CoV-2/FLU/RSV testing.  Fact Sheet for Patients: BloggerCourse.com  Fact Sheet for Healthcare Providers: SeriousBroker.it  This test is not yet approved or cleared by the [place] FDA and has been authorized for detection and/or diagnosis of SARS-CoV-2 by FDA under an Emergency Use Authorization (EUA). This EUA will remain in effect (meaning this test can be used) for the duration of the COVID-19 declaration under Section 564(b)(1) of the Act, 21 U.S.C. section 360bbb-3(b)(1), unless the authorization is terminated or revoked.  Performed at [facility], [address] [place], [place] [address]      Medications:    acetaminophen  1,000 mg Oral Q6H   enoxaparin (LOVENOX) injection  40 mg Subcutaneous Q24H    sodium chloride flush  3 mL Intravenous Q12H   Continuous Infusions:  sodium chloride     methocarbamol (ROBAXIN) IV     piperacillin-tazobactam (ZOSYN)  IV 3.375 g (02/19/21 0606)      LOS: 2 days   Marinda Elk  [facility]  02/19/2021, 10:31 AM

## 2021-02-19 NOTE — Plan of Care (Signed)
  Problem: Education: Goal: Knowledge of General Education information will improve Description: Including pain rating scale, medication(s)/side effects and non-pharmacologic comfort measures Outcome: Progressing   Problem: Health Behavior/Discharge Planning: Goal: Ability to manage health-related needs will improve Outcome: Progressing   Problem: Clinical Measurements: Goal: Ability to maintain clinical measurements within normal limits will improve Outcome: Progressing Goal: Will remain free from infection Outcome: Progressing Goal: Diagnostic test results will improve Outcome: Progressing Goal: Respiratory complications will improve Outcome: Progressing Goal: Cardiovascular complication will be avoided Outcome: Progressing   Problem: Activity: Goal: Risk for activity intolerance will decrease Outcome: Progressing   Problem: Nutrition: Goal: Adequate nutrition will be maintained Outcome: Progressing   Problem: Elimination: Goal: Will not experience complications related to bowel motility Outcome: Progressing Goal: Will not experience complications related to urinary retention Outcome: Progressing   Problem: Skin Integrity: Goal: Risk for impaired skin integrity will decrease Outcome: Progressing   

## 2021-02-19 NOTE — Progress Notes (Signed)
Report received and care assumed from previous shift. VS obtained, shift assessments completed - see flowsheets. PRN pain medications given as needed - see MAR. Maintained NPO after midnight as ordered. Daughter at bedside. Patient OOB x1 this shift to bedside commode with two assist and unsteady gait, medicated for pain prior to moving but patient noted to be in 7/10 pain per PAINAD scale after movement and inconsolable - J. Garner Nash NP notified and orders received for x1 dose 12.38mcg fentanyl IVP (see MAR); patient tolerated medication well and noted to be resting after administration with eyes closed. Patient currently resting in bed, bed in lowest position. Denies needs. Call bell within reach. Bedalarm in use at all times.

## 2021-02-19 NOTE — Progress Notes (Addendum)
Attending physician's note   I have taken an interval history, reviewed the chart and examined the patient. I agree with the Advanced Practitioner's note, impression, and recommendations as outlined.    T bili normal and ALP downtrending.  AST/ALT slightly up, but in the setting of CC Y yesterday.  IOC results reviewed.  Discussed with Dr. Marina Goodell from the advanced biliary service.  Tentative plan for ERCP tomorrow.  I discussed this at length with the patient's daughter at bedside and she agrees.  The indications, risks, and benefits of ERCP were explained to the patient's daughter in detail. Risks include but are not limited to bleeding, perforation, adverse reaction to medications, pancreatitis, inability to cannulate duct, and cardiopulmonary compromise. Sequelae include but are not limited to the possibility of surgery, prolonged hospitalization, and mortality.  All questions answered to the best of my ability.   Cheyla Duchemin, DO, FACG [phone] office                Daily Rounding Note  02/19/2021, 10:38 AM  LOS: 2 days   SUBJECTIVE:   Chief complaint:    Choledocholithiasis.  Status post lap chole.  Patient calm now after receiving pain meds for postoperative pain.  No reports of nausea or vomiting.  Daughter is wondering when she is going to be able to get any p.o. intake as she is n.p.o. currently  OBJECTIVE:         Vital signs in last 24 hours:    Temp:  [97.6 F (36.4 C)-99.4 F (37.4 C)] 97.9 F (36.6 C) (11/30 0804) Pulse Rate:  [53-83] 69 (11/30 0804) Resp:  [15-20] 15 (11/30 0502) BP: (90-160)/(46-80) 93/49 (11/30 0804) SpO2:  [94 %-100 %] 94 % (11/30 0804) Last BM Date: 02/16/21 There were no vitals filed for this visit. General: Calm, resting in bed.  Arises easily Heart: RRR Chest: No labored breathing or cough.  Lungs clear Abdomen: JP drain with sanguinous, small volume contents.  Bruising  around a couple of the surgical incisions but no active bleeding.  Tender on the right abdomen towards the midline.  Bowel sounds hypoactive. Extremities: No CCE. Neuro/Psych: Calmer today than previous.  Intake/Output from previous day: 11/29 0701 - 11/30 0700 In: 2098.3 [I.V.:1848.3; IV Piggyback:250] Out: 750 [Urine:350; Drains:200; Blood:200]  Intake/Output this shift: Total I/O In: -  Out: 50 [Drains:50]  Lab Results: Recent Labs    02/17/21 0344 02/18/21 0116 02/18/21 1823 02/19/21 0328  WBC 7.3 5.8  --  10.5  HGB 13.6 13.0 12.9 12.3  HCT 40.6 40.0 41.1 38.8  PLT 286 334  --  275   BMET Recent Labs    02/17/21 0344 02/18/21 0116 02/19/21 0328  NA 133* 140 136  K 3.3* 3.4* 3.7  CL 100 105 106  CO2 25 25 20*  GLUCOSE 113* 106* 120*  BUN 14 13 11   CREATININE 0.88 0.89 0.75  CALCIUM 9.0 9.4 8.8*   LFT Recent Labs    02/17/21 0344 02/18/21 0116 02/19/21 0328  PROT 6.5 6.5 5.6*  ALBUMIN 2.9* 2.9* 2.7*  AST 92* 41 95*  ALT 95* 67* 77*  ALKPHOS 179* 177* 134*  BILITOT 0.8 1.0 1.0   PT/INR Recent Labs    02/16/21 2044  LABPROT 13.8  INR 1.1   Hepatitis Panel Recent Labs    02/17/21 0344  HEPBSAG NON REACTIVE  HCVAB NON REACTIVE  HEPAIGM NON REACTIVE  HEPBIGM NON REACTIVE    Studies/Results: DG Cholangiogram Operative  Result Date: 02/18/2021 CLINICAL DATA:  Intraoperative cholangiogram during laparoscopic cholecystectomy. EXAM: INTRAOPERATIVE CHOLANGIOGRAM FLUOROSCOPY TIME:  16 seconds (3.2 mGy) COMPARISON:  CT abdomen and pelvis-02/16/2021 FINDINGS: Multiple spot intraoperative cholangiographic images of the right upper abdominal quadrant during laparoscopic cholecystectomy are provided for review. Surgical clips overlie the expected location of the gallbladder fossa. Contrast injection demonstrates selective cannulation of the central aspect of the cystic duct. There is extravasation of contrast about the cystic duct cannulation site there is  however passage of contrast through the central aspect of the cystic duct with filling of a markedly dilated common bile duct. There is minimal reflux of injected contrast into the common hepatic duct and central aspect of the mildly dilated intrahepatic biliary system. There is no definitive passage of contrast to the level of the distal aspect of the CBD. There is no definitive opacification of the duodenum. IMPRESSION: No definitive passage of contrast through the distal aspect of the CBD - while potentially technique related, an ampullary stenosis/occlusion or occlusive choledocholithiasis could result in a similar appearance. Further evaluation and management with ERCP could be performed indicated. Electronically Signed   By: Simonne Come M.D.   On: 02/18/2021 14:58    Scheduled Meds:  acetaminophen  1,000 mg Oral Q6H   enoxaparin (LOVENOX) injection  40 mg Subcutaneous Q24H   melatonin  3 mg Oral QHS   sodium chloride flush  3 mL Intravenous Q12H   Continuous Infusions:  sodium chloride     dextrose 5 % and 0.45% NaCl 75 mL/hr at 02/19/21 1140   methocarbamol (ROBAXIN) IV     piperacillin-tazobactam (ZOSYN)  IV 3.375 g (02/19/21 1315)   PRN Meds:.sodium chloride, fentaNYL (SUBLIMAZE) injection, lip balm, methocarbamol (ROBAXIN) IV, ondansetron **OR** ondansetron (ZOFRAN) IV, oxyCODONE, QUEtiapine, sodium chloride flush   ASSESMENT:   Cholecystitis.  02/18/2021 lap chole, IOC.  IOC with concern for round structure at the ampulla and no flow of contrast into duodenum.  Gallbladder was dilated, acutely inflamed, gangrenous, adherent to omentum.  Choledocholithiasis.  Yesterday's LFTs had shown decreasing trend but they are increased today  Dementia with agitation.  Better today after Fentanyl for pain.     PLAN      Case discussed with Dr. Marina Goodell and Dr. Barron Alvine.  She is on for ERCP tomorrow at 1330.  Does not need additional preprocedure antibiotics as she is already on Zosyn.  Can  have diet as tolerated today but n.p.o. after midnight.  Hold additional Lovenox til after ERCP    Jennye Moccasin  02/19/2021, 10:38 AM Phone [phone]

## 2021-02-20 ENCOUNTER — Encounter (HOSPITAL_COMMUNITY)
Admission: EM | Disposition: A | Discharge status: Home Health | Payer: Self-pay | Source: Home / Self Care | Attending: Internal Medicine

## 2021-02-20 ENCOUNTER — Inpatient Hospital Stay (HOSPITAL_COMMUNITY): Payer: Medicare Other | Admitting: Certified Registered Nurse Anesthetist

## 2021-02-20 ENCOUNTER — Inpatient Hospital Stay (HOSPITAL_COMMUNITY): Payer: Medicare Other

## 2021-02-20 ENCOUNTER — Encounter (HOSPITAL_COMMUNITY): Payer: Self-pay | Admitting: Family Medicine

## 2021-02-20 DIAGNOSIS — F03918 Unspecified dementia, unspecified severity, with other behavioral disturbance: Secondary | ICD-10-CM | POA: Diagnosis not present

## 2021-02-20 DIAGNOSIS — K831 Obstruction of bile duct: Secondary | ICD-10-CM

## 2021-02-20 DIAGNOSIS — D518 Other vitamin B12 deficiency anemias: Secondary | ICD-10-CM | POA: Diagnosis not present

## 2021-02-20 DIAGNOSIS — R1011 Right upper quadrant pain: Secondary | ICD-10-CM | POA: Diagnosis not present

## 2021-02-20 DIAGNOSIS — K838 Other specified diseases of biliary tract: Secondary | ICD-10-CM | POA: Diagnosis not present

## 2021-02-20 HISTORY — PX: ENDOSCOPIC RETROGRADE CHOLANGIOPANCREATOGRAPHY (ERCP) WITH PROPOFOL: SHX5810

## 2021-02-20 HISTORY — PX: SPHINCTEROTOMY: SHX5544

## 2021-02-20 LAB — CBC WITH DIFFERENTIAL/PLATELET
Abs Immature Granulocytes: 0.1 10*3/uL — ABNORMAL HIGH (ref 0.00–0.07)
Basophils Absolute: 0 10*3/uL (ref 0.0–0.1)
Basophils Relative: 0 %
Eosinophils Absolute: 0 10*3/uL (ref 0.0–0.5)
Eosinophils Relative: 0 %
HCT: 37.1 % (ref 36.0–46.0)
Hemoglobin: 11.6 g/dL — ABNORMAL LOW (ref 12.0–15.0)
Immature Granulocytes: 1 %
Lymphocytes Relative: 11 %
Lymphs Abs: 1.2 10*3/uL (ref 0.7–4.0)
MCH: 28.4 pg (ref 26.0–34.0)
MCHC: 31.3 g/dL (ref 30.0–36.0)
MCV: 90.7 fL (ref 80.0–100.0)
Monocytes Absolute: 1.2 10*3/uL — ABNORMAL HIGH (ref 0.1–1.0)
Monocytes Relative: 11 %
Neutro Abs: 8.7 10*3/uL — ABNORMAL HIGH (ref 1.7–7.7)
Neutrophils Relative %: 77 %
Platelets: 278 10*3/uL (ref 150–400)
RBC: 4.09 MIL/uL (ref 3.87–5.11)
RDW: 13.5 % (ref 11.5–15.5)
WBC: 11.3 10*3/uL — ABNORMAL HIGH (ref 4.0–10.5)
nRBC: 0 % (ref 0.0–0.2)

## 2021-02-20 LAB — SURGICAL PATHOLOGY

## 2021-02-20 SURGERY — ENDOSCOPIC RETROGRADE CHOLANGIOPANCREATOGRAPHY (ERCP) WITH PROPOFOL
Anesthesia: General

## 2021-02-20 MED ORDER — CIPROFLOXACIN IN D5W 400 MG/200ML IV SOLN
INTRAVENOUS | Status: AC
  Filled 2021-02-20: qty 200

## 2021-02-20 MED ORDER — PROPOFOL 10 MG/ML IV BOLUS
INTRAVENOUS | Status: DC | PRN
  Administered 2021-02-20: 80 mg via INTRAVENOUS

## 2021-02-20 MED ORDER — SODIUM CHLORIDE 0.9 % IV SOLN
INTRAVENOUS | Status: DC | PRN
  Administered 2021-02-20: 50 mL

## 2021-02-20 MED ORDER — INDOMETHACIN 50 MG RE SUPP
RECTAL | Status: AC
  Filled 2021-02-20: qty 2

## 2021-02-20 MED ORDER — PHENYLEPHRINE HCL-NACL 20-0.9 MG/250ML-% IV SOLN
INTRAVENOUS | Status: DC | PRN
  Administered 2021-02-20: 25 ug/min via INTRAVENOUS

## 2021-02-20 MED ORDER — INDOMETHACIN 50 MG RE SUPP
RECTAL | Status: DC | PRN
  Administered 2021-02-20: 100 mg via RECTAL

## 2021-02-20 MED ORDER — SUGAMMADEX SODIUM 200 MG/2ML IV SOLN
INTRAVENOUS | Status: DC | PRN
  Administered 2021-02-20: 270.4 mg via INTRAVENOUS

## 2021-02-20 MED ORDER — GLUCAGON HCL RDNA (DIAGNOSTIC) 1 MG IJ SOLR
INTRAMUSCULAR | Status: AC
  Filled 2021-02-20: qty 1

## 2021-02-20 MED ORDER — PHENYLEPHRINE 40 MCG/ML (10ML) SYRINGE FOR IV PUSH (FOR BLOOD PRESSURE SUPPORT)
PREFILLED_SYRINGE | INTRAVENOUS | Status: DC | PRN
  Administered 2021-02-20: 120 ug via INTRAVENOUS

## 2021-02-20 MED ORDER — ONDANSETRON HCL 4 MG/2ML IJ SOLN
INTRAMUSCULAR | Status: DC | PRN
  Administered 2021-02-20: 4 mg via INTRAVENOUS

## 2021-02-20 MED ORDER — LIDOCAINE 2% (20 MG/ML) 5 ML SYRINGE
INTRAMUSCULAR | Status: DC | PRN
  Administered 2021-02-20: 60 mg via INTRAVENOUS

## 2021-02-20 MED ORDER — ROCURONIUM BROMIDE 10 MG/ML (PF) SYRINGE
PREFILLED_SYRINGE | INTRAVENOUS | Status: DC | PRN
  Administered 2021-02-20: 50 mg via INTRAVENOUS

## 2021-02-20 MED ORDER — DEXAMETHASONE SODIUM PHOSPHATE 10 MG/ML IJ SOLN
INTRAMUSCULAR | Status: DC | PRN
  Administered 2021-02-20: 10 mg via INTRAVENOUS

## 2021-02-20 MED ORDER — LACTATED RINGERS IV SOLN: INTRAVENOUS | Status: DC | PRN

## 2021-02-20 MED ORDER — FENTANYL CITRATE PF 50 MCG/ML IJ SOSY
12.5000 ug | PREFILLED_SYRINGE | Freq: Once | INTRAMUSCULAR | Status: AC
  Administered 2021-02-20: 12.5 ug via INTRAVENOUS
  Filled 2021-02-20: qty 1

## 2021-02-20 MED ORDER — INDOMETHACIN 50 MG RE SUPP: 100.0000 mg | RECTAL | Status: AC

## 2021-02-20 NOTE — Progress Notes (Signed)
[facility] PROGRESS NOTE    Progress Note  Ebony Pierce  JXB:[id number] DOB: [birth year] DOA: 02/16/2021 PCP: Derrel Nip, MD     Brief Narrative:   Ebony Pierce is an 85 y.o. female past medical history significant of dementia, vitamin D B12 deficiency who comes into the ED for right upper quadrant pain was seen in the ED on 02/13/2021 with similar complaints labs were unremarkable.  She returned to the ED with the same complaint right upper quadrant ultrasound showed normal gallbladder irregular thickening of the wall and edema with up to 9 mm associated with pericolic cystic fluid positive Murphy sign surgery was consulted she is status post surgical intervention on 02/18/2021    Assessment/Plan: GI on   RUQ pain in the setting of transaminitis concern for cholecystitis: Status post lap chole on 02/18/2021 with inflamed gangrenous gallbladder bile duct there was a concern for round stricture at the ampulla and no flow of contrast into the duodenum.  GI on board anticipate ERCP today 03/12/2022.  Continue IV antibiotics Zosyn, keep the patient NPO. Daily on narcotics for analgesics.  Fever and leukocytosis: Mild leukocytosis now with a fever overnight. She is currently on Zosyn, check blood cultures  Hypokalemia: Replete orally 3.7 today.  Dementia with behavioral disturbances: At high risk of aspiration and delirium monitor closely.  Vitamin B12 deficiency: Checked this year it was stable.   DVT prophylaxis: lovenox Family Communication:none Status is: Inpatient  Remains inpatient appropriate because: Acute cholecystitis which will not require ERCP.        Code Status:     Code Status Orders  (From admission, onward)           Start     Ordered   02/16/21 1806  Full code  Continuous        02/16/21 1809           Code Status History     This patient has a current code status but no historical code status.         IV  Access:   Peripheral IV   Procedures and diagnostic studies:   DG Cholangiogram Operative  Result Date: 02/18/2021 CLINICAL DATA:  Intraoperative cholangiogram during laparoscopic cholecystectomy. EXAM: INTRAOPERATIVE CHOLANGIOGRAM FLUOROSCOPY TIME:  16 seconds (3.2 mGy) COMPARISON:  CT abdomen and pelvis-02/16/2021 FINDINGS: Multiple spot intraoperative cholangiographic images of the right upper abdominal quadrant during laparoscopic cholecystectomy are provided for review. Surgical clips overlie the expected location of the gallbladder fossa. Contrast injection demonstrates selective cannulation of the central aspect of the cystic duct. There is extravasation of contrast about the cystic duct cannulation site there is however passage of contrast through the central aspect of the cystic duct with filling of a markedly dilated common bile duct. There is minimal reflux of injected contrast into the common hepatic duct and central aspect of the mildly dilated intrahepatic biliary system. There is no definitive passage of contrast to the level of the distal aspect of the CBD. There is no definitive opacification of the duodenum. IMPRESSION: No definitive passage of contrast through the distal aspect of the CBD - while potentially technique related, an ampullary stenosis/occlusion or occlusive choledocholithiasis could result in a similar appearance. Further evaluation and management with ERCP could be performed indicated. Electronically Signed   By: Simonne Come M.D.   On: 02/18/2021 14:58     Medical Consultants:   None.   Subjective:    Ebony Pierce sleeping this morning.  Objective:    Vitals:  02/19/21 2357 02/20/21 0354 02/20/21 0445 02/20/21 0816  BP:  98/64 122/77 110/70  Pulse:  68  (!) 110  Resp:  17  15  Temp: 98.1 F (36.7 C) (!) 97.2 F (36.2 C)  (!) 100.5 F (38.1 C)  TempSrc: Oral Oral  Oral  SpO2:  97%  98%  Height:       SpO2: 98 % O2 Flow Rate (L/min): 2  L/min   Intake/Output Summary (Last 24 hours) at 02/20/2021 1012 Last data filed at 02/20/2021 0522 Gross per 24 hour  Intake 708 ml  Output 105 ml  Net 603 ml    There were no vitals filed for this visit.  Exam: General exam: In no acute distress. Respiratory system: Good air movement and clear to auscultation. Cardiovascular system: S1 & S2 heard, RRR. No JVD. Gastrointestinal system: Abdomen is nondistended, soft and nontender.  Extremities: No pedal edema. Skin: No rashes, lesions or ulcers   Data Reviewed:    Labs: Basic Metabolic Panel: Recent Labs  Lab 02/16/21 1328 02/17/21 0344 02/18/21 0116 02/19/21 0328 02/19/21 2015  NA 137 133* 140 136 134*  K 4.1 3.3* 3.4* 3.7 3.9  CL 102 100 105 106 102  CO2 27 25 25  20* 22  GLUCOSE 108* 113* 106* 120* 152*  BUN 21 14 13 11  7*  CREATININE 0.85 0.88 0.89 0.75 0.77  CALCIUM 9.7 9.0 9.4 8.8* 8.8*    GFR Estimated Creatinine Clearance: 45.7 mL/min (by C-G formula based on SCr of 0.77 mg/dL). Liver Function Tests: Recent Labs  Lab 02/16/21 1328 02/17/21 0344 02/18/21 0116 02/19/21 0328 02/19/21 2015  AST 277* 92* 41 95* 69*  ALT 108* 95* 67* 77* 72*  ALKPHOS 223* 179* 177* 134* 131*  BILITOT 1.5* 0.8 1.0 1.0 0.8  PROT 7.2 6.5 6.5 5.6* 5.9*  ALBUMIN 3.3* 2.9* 2.9* 2.7* 2.7*    Recent Labs  Lab 02/13/21 1931 02/16/21 1328  LIPASE 31 31    No results for input(s): AMMONIA in the last 168 hours. Coagulation profile Recent Labs  Lab 02/16/21 2044  INR 1.1    COVID-19 Labs  No results for input(s): DDIMER, FERRITIN, LDH, CRP in the last 72 hours.  Lab Results  Component Value Date   Herrick NEGATIVE 02/16/2021    CBC: Recent Labs  Lab 02/13/21 1931 02/16/21 1328 02/17/21 0344 02/18/21 0116 02/18/21 1823 02/19/21 0328 02/20/21 0149  WBC 9.5 12.3* 7.3 5.8  --  10.5 11.3*  NEUTROABS 8.5* 9.8*  --   --   --   --  8.7*  HGB 15.6* 14.6 13.6 13.0 12.9 12.3 11.6*  HCT 50.3* 45.0 40.6  40.0 41.1 38.8 37.1  MCV 91.3 90.4 87.3 88.9  --  92.4 90.7  PLT 305 279 286 334  --  275 278    Cardiac Enzymes: No results for input(s): CKTOTAL, CKMB, CKMBINDEX, TROPONINI in the last 168 hours. BNP (last 3 results) No results for input(s): PROBNP in the last 8760 hours. CBG: No results for input(s): GLUCAP in the last 168 hours. D-Dimer: No results for input(s): DDIMER in the last 72 hours. Hgb A1c: No results for input(s): HGBA1C in the last 72 hours. Lipid Profile: No results for input(s): CHOL, HDL, LDLCALC, TRIG, CHOLHDL, LDLDIRECT in the last 72 hours. Thyroid function studies: No results for input(s): TSH, T4TOTAL, T3FREE, THYROIDAB in the last 72 hours.  Invalid input(s): FREET3 Anemia work up: No results for input(s): VITAMINB12, FOLATE, FERRITIN, TIBC, IRON, RETICCTPCT in the last  72 hours. Sepsis Labs: Recent Labs  Lab 02/17/21 0344 02/18/21 0116 02/19/21 0328 02/20/21 0149  WBC 7.3 5.8 10.5 11.3*    Microbiology Recent Results (from the past 240 hour(s))  Resp Panel by RT-PCR (Flu A&B, Covid) Nasopharyngeal Swab     Status: None   Collection Time: 02/16/21 10:38 PM   Specimen: Nasopharyngeal Swab; Nasopharyngeal(NP) swabs in vial transport medium  Result Value Ref Range Status   SARS Coronavirus 2 by RT PCR NEGATIVE NEGATIVE Final    Comment: (NOTE) SARS-CoV-2 target nucleic acids are NOT DETECTED.  The SARS-CoV-2 RNA is generally detectable in upper respiratory specimens during the acute phase of infection. The lowest concentration of SARS-CoV-2 viral copies this assay can detect is 138 copies/mL. A negative result does not preclude SARS-Cov-2 infection and should not be used as the sole basis for treatment or other patient management decisions. A negative result may occur with  improper specimen collection/handling, submission of specimen other than nasopharyngeal swab, presence of viral mutation(s) within the areas targeted by this assay, and  inadequate number of viral copies(<138 copies/mL). A negative result must be combined with clinical observations, patient history, and epidemiological information. The expected result is Negative.  Fact Sheet for Patients:  EntrepreneurPulse.com.au  Fact Sheet for Healthcare Providers:  IncredibleEmployment.be  This test is no t yet approved or cleared by the [place] FDA and  has been authorized for detection and/or diagnosis of SARS-CoV-2 by FDA under an Emergency Use Authorization (EUA). This EUA will remain  in effect (meaning this test can be used) for the duration of the COVID-19 declaration under Section 564(b)(1) of the Act, 21 U.S.C.section 360bbb-3(b)(1), unless the authorization is terminated  or revoked sooner.       Influenza A by PCR NEGATIVE NEGATIVE Final   Influenza B by PCR NEGATIVE NEGATIVE Final    Comment: (NOTE) The Xpert Xpress SARS-CoV-2/FLU/RSV plus assay is intended as an aid in the diagnosis of influenza from Nasopharyngeal swab specimens and should not be used as a sole basis for treatment. Nasal washings and aspirates are unacceptable for Xpert Xpress SARS-CoV-2/FLU/RSV testing.  Fact Sheet for Patients: EntrepreneurPulse.com.au  Fact Sheet for Healthcare Providers: IncredibleEmployment.be  This test is not yet approved or cleared by the [place] FDA and has been authorized for detection and/or diagnosis of SARS-CoV-2 by FDA under an Emergency Use Authorization (EUA). This EUA will remain in effect (meaning this test can be used) for the duration of the COVID-19 declaration under Section 564(b)(1) of the Act, 21 U.S.C. section 360bbb-3(b)(1), unless the authorization is terminated or revoked.  Performed at [facility], [place] [address], [place], [place] [address]      Medications:    acetaminophen  1,000 mg Oral Q6H   [START ON 02/21/2021] enoxaparin  (LOVENOX) injection  40 mg Subcutaneous Q24H   indomethacin  100 mg Rectal To Endo   melatonin  3 mg Oral QHS   sodium chloride flush  3 mL Intravenous Q12H   Continuous Infusions:  sodium chloride     dextrose 5 % and 0.45% NaCl 75 mL/hr at 02/20/21 0011   methocarbamol (ROBAXIN) IV     piperacillin-tazobactam (ZOSYN)  IV 3.375 g (02/20/21 0616)      LOS: 3 days   Charlynne Cousins  [facility]  02/20/2021, 10:12 AM

## 2021-02-20 NOTE — Anesthesia Procedure Notes (Signed)
Procedure Name: Intubation Date/Time: 02/20/2021 1:45 PM Performed by: Minerva Ends, CRNA Pre-anesthesia Checklist: Patient identified, Emergency Drugs available, Suction available and Patient being monitored Patient Re-evaluated:Patient Re-evaluated prior to induction Oxygen Delivery Method: Circle system utilized Preoxygenation: Pre-oxygenation with 100% oxygen Induction Type: IV induction Ventilation: Mask ventilation without difficulty Laryngoscope Size: Mac and 3 Grade View: Grade II Tube type: Oral Tube size: 7.0 mm Number of attempts: 1 Airway Equipment and Method: Stylet Placement Confirmation: ETT inserted through vocal cords under direct vision, positive ETCO2 and breath sounds checked- equal and bilateral Secured at: 22 cm Tube secured with: Tape Dental Injury: Teeth and Oropharynx as per pre-operative assessment

## 2021-02-20 NOTE — Progress Notes (Addendum)
Patient ID: Ebony Pierce, female   DOB: 1935/07/26, 85 y.o.   MRN: [id number]      Progress Note   Subjective  Day # 3 CC; CBD stone, status post lap chole 02/18/2021  IV Zosyn  Daughter at bedside, patient has been complaining of some pain in the right mid abdomen.  Blood in JP drain  Labs-WBC 11.3/hemoglobin 11.6/hematocrit 37.1 C-Met yesterday-T bili 0.8/alk phos 131/AST 69/ALT 72    Objective   Vital signs in last 24 hours: Temp:  [97.2 F (36.2 C)-100.5 F (38.1 C)] 100.5 F (38.1 C) (12/01 0816) Pulse Rate:  [68-110] 110 (12/01 0816) Resp:  [15-17] 15 (12/01 0816) BP: (98-122)/(56-77) 110/70 (12/01 0816) SpO2:  [95 %-98 %] 98 % (12/01 0816) Last BM Date: 02/16/21 General:    Elderly female in NAD Heart:  Regular rate and rhythm; no murmurs Lungs: Respirations even and unlabored, lungs CTA bilaterally Abdomen:  Soft, tender in the right mid and mid abdomen, no rebound and nondistended.  Bowel sounds present, some ecchymoses around incisional ports, bloody effluent from JP Extremities:  Without edema. Neurologic:  Alert and oriented,  grossly normal neurologically. Psych:  Cooperative. Normal mood and affect.  Intake/Output from previous day: 11/30 0701 - 12/01 0700 In: 708 [P.O.:50; I.V.:608; IV Piggyback:50] Out: 155 [Drains:155] Intake/Output this shift: No intake/output data recorded.  Lab Results: Recent Labs    02/18/21 0116 02/18/21 1823 02/19/21 0328 02/20/21 0149  WBC 5.8  --  10.5 11.3*  HGB 13.0 12.9 12.3 11.6*  HCT 40.0 41.1 38.8 37.1  PLT 334  --  275 278   BMET Recent Labs    02/18/21 0116 02/19/21 0328 02/19/21 2015  NA 140 136 134*  K 3.4* 3.7 3.9  CL 105 106 102  CO2 25 20* 22  GLUCOSE 106* 120* 152*  BUN 13 11 7*  CREATININE 0.89 0.75 0.77  CALCIUM 9.4 8.8* 8.8*   LFT Recent Labs    02/19/21 2015  PROT 5.9*  ALBUMIN 2.7*  AST 69*  ALT 72*  ALKPHOS 131*  BILITOT 0.8   PT/INR No results for input(s):  LABPROT, INR in the last 72 hours.  Studies/Results: DG Cholangiogram Operative  Result Date: 02/18/2021 CLINICAL DATA:  Intraoperative cholangiogram during laparoscopic cholecystectomy. EXAM: INTRAOPERATIVE CHOLANGIOGRAM FLUOROSCOPY TIME:  16 seconds (3.2 mGy) COMPARISON:  CT abdomen and pelvis-02/16/2021 FINDINGS: Multiple spot intraoperative cholangiographic images of the right upper abdominal quadrant during laparoscopic cholecystectomy are provided for review. Surgical clips overlie the expected location of the gallbladder fossa. Contrast injection demonstrates selective cannulation of the central aspect of the cystic duct. There is extravasation of contrast about the cystic duct cannulation site there is however passage of contrast through the central aspect of the cystic duct with filling of a markedly dilated common bile duct. There is minimal reflux of injected contrast into the common hepatic duct and central aspect of the mildly dilated intrahepatic biliary system. There is no definitive passage of contrast to the level of the distal aspect of the CBD. There is no definitive opacification of the duodenum. IMPRESSION: No definitive passage of contrast through the distal aspect of the CBD - while potentially technique related, an ampullary stenosis/occlusion or occlusive choledocholithiasis could result in a similar appearance. Further evaluation and management with ERCP could be performed indicated. Electronically Signed   By: Sandi Mariscal M.D.   On: 02/18/2021 14:58       Assessment / Plan:    #83 85 year old female status post laparoscopic cholecystectomy  02/18/2021 for acute cholecystitis, abnormal IOC with concerns for choledocholithiasis.  LFTs stable/improved yesterday-  Continues on IV Zosyn  #2 dementia  #3 mild anemia  Plan; patient is scheduled for ERCP with stone extraction today with Dr. Henrene Pastor Daughter would like phone call post procedure Continue IV Zosyn CBC/hepatic  panel tomorrow Patient has been on Lovenox -with patient's nurse, this has been held since yesterday morning    Principal Problem:   RUQ pain Active Problems:   Dementia with behavioral disturbance   B12 deficiency anemia   Elevated liver enzymes   Abnormal gallbladder ultrasound   Leukocytosis   Cholecystitis, acute     LOS: 3 days   Amy Esterwood PA-C 02/20/2021, 9:27 AM  GI ATTENDING  Interval history data reviewed.  Agree with interval progress note.  Patient is for ERCP with possible sphincterotomy and stone extraction due to not emptying dilated bile duct.  Query distal bile duct stone.  Docia Chuck. Geri Seminole., M.D. [facility] Division of Gastroenterology

## 2021-02-20 NOTE — Anesthesia Preprocedure Evaluation (Signed)
Anesthesia Evaluation  Patient identified by MRN, date of birth, ID band Patient awake    Reviewed: Allergy & Precautions, H&P , NPO status , Patient's Chart, lab work & pertinent test results  Airway Mallampati: II   Neck ROM: full    Dental   Pulmonary neg pulmonary ROS,    breath sounds clear to auscultation       Cardiovascular negative cardio ROS   Rhythm:regular Rate:Normal     Neuro/Psych PSYCHIATRIC DISORDERS Dementia    GI/Hepatic choledocholithiasis   Endo/Other    Renal/GU      Musculoskeletal   Abdominal   Peds  Hematology   Anesthesia Other Findings   Reproductive/Obstetrics                             Anesthesia Physical Anesthesia Plan  ASA: 3  Anesthesia Plan: General   Post-op Pain Management:    Induction: Intravenous  PONV Risk Score and Plan: 3 and Ondansetron, Dexamethasone and Treatment may vary due to age or medical condition  Airway Management Planned: Oral ETT  Additional Equipment:   Intra-op Plan:   Post-operative Plan: Extubation in OR  Informed Consent: I have reviewed the patients History and Physical, chart, labs and discussed the procedure including the risks, benefits and alternatives for the proposed anesthesia with the patient or authorized representative who has indicated his/her understanding and acceptance.     Dental advisory given  Plan Discussed with: CRNA, Anesthesiologist and Surgeon  Anesthesia Plan Comments:         Anesthesia Quick Evaluation

## 2021-02-20 NOTE — Care Management Important Message (Signed)
Important Message  Patient Details  Name: Tyteanna Ost MRN: [id number] Date of Birth: [birth year]   Medicare Important Message Given:  Yes  12/1 Family/daughter is requesting a Rolator and bedside commode for patient.   Sherilyn Banker 02/20/2021, 2:34 PM

## 2021-02-20 NOTE — Transfer of Care (Signed)
Immediate Anesthesia Transfer of Care Note  Patient: Ebony Pierce  Procedure(s) Performed: ENDOSCOPIC RETROGRADE CHOLANGIOPANCREATOGRAPHY (ERCP) WITH PROPOFOL SPHINCTEROTOMY REMOVAL OF STONES  Patient Location: Endoscopy Unit  Anesthesia Type:General  Level of Consciousness: awake and alert   Airway & Oxygen Therapy: Patient Spontanous Breathing  Post-op Assessment: Report given to RN and Post -op Vital signs reviewed and stable  Post vital signs: Reviewed and stable  Last Vitals:  Vitals Value Taken Time  BP 122/86 02/20/21 1445  Temp    Pulse 67 02/20/21 1445  Resp 24 02/20/21 1445  SpO2 99 % 02/20/21 1445  Vitals shown include unvalidated device data.  Last Pain:  Vitals:   02/20/21 1322  TempSrc: Temporal  PainSc: 4          Complications: No notable events documented.

## 2021-02-20 NOTE — Op Note (Addendum)
[facility] Patient Name: Ebony Pierce Procedure Date : 02/20/2021 MRN: [id number] Attending MD: Wilhemina Bonito. Marina Goodell , MD Date of Birth: Nov 11, 1935 CSN: [id number] Age: 85 Admit Type: Inpatient Procedure:                ERCP with biliary sphincterotomy Indications:              Biliary dilation on Computed Tomogram Scan,                            Abnormal liver function test. Status post                            cholecystectomy with gangrenous cholecystitis.                            Intraoperative cholangiogram revealed dilated bile                            duct with no meaningful emptying. Question stone or                            stricture. Providers:                Wilhemina Bonito. Marina Goodell, MD, Willy Eddy, RN, Adolph Pollack, RN, Michele Mcalpine Technician Referring MD:             [facility] Surgery Medicines:                General Anesthesia Complications:            No immediate complications. Estimated Blood Loss:     Estimated blood loss: none. Procedure:                Pre-Anesthesia Assessment:                           - Prior to the procedure, a History and Physical                            was performed, and patient medications and                            allergies were reviewed. The patient is competent.                            The risks and benefits of the procedure and the                            sedation options and risks were discussed with the                            patient. All questions were answered and informed  consent was obtained. Patient identification and                            proposed procedure were verified by the physician.                            Mental Status Examination: alert and oriented.                            Airway Examination: normal oropharyngeal airway and                            neck mobility. Respiratory Examination: clear to                             auscultation. CV Examination: normal. Prophylactic                            Antibiotics: The patient does not require                            prophylactic antibiotics. Prior Anticoagulants: The                            patient has taken no previous anticoagulant or                            antiplatelet agents. ASA Grade Assessment: II - A                            patient with mild systemic disease. After reviewing                            the risks and benefits, the patient was deemed in                            satisfactory condition to undergo the procedure.                            The anesthesia plan was to use moderate sedation /                            analgesia (conscious sedation). Immediately prior                            to administration of medications, the patient was                            re-assessed for adequacy to receive sedatives. The                            heart rate, respiratory rate, oxygen saturations,  blood pressure, adequacy of pulmonary ventilation,                            and response to care were monitored throughout the                            procedure. The physical status of the patient was                            re-assessed after the procedure.                           After obtaining informed consent, the scope was                            passed under direct vision. Throughout the                            procedure, the patient's blood pressure, pulse, and                            oxygen saturations were monitored continuously. The                            [id number]-[id number] ([id number] Olympus duodenoscope was                            introduced through the mouth, and used to inject                            contrast into and used to inject contrast into the                            bile duct and ventral pancreatic duct. The ERCP was                            accomplished  without difficulty. The patient                            tolerated the procedure well. Scope In: Scope Out: Findings:      ENDOSCOPIC: The side-viewing endoscope was passed blindly into the       esophagus. The stomach and duodenal bulb were normal. The postbulbar       duodenum revealed a large diverticulum. The diverticulum was       periampullary. Moderate bili was not sought.      X-ray:      1. A scout radiograph of the abdomen with the endoscope in position       revealed biliary drain and cholecystectomy clips.      2. Initial injection of contrast yielded partial filling of the       pancreatic and bile ducts.      3. Pancreas divisum      4. The cannulation of the bile duct was achieved. Complete filling of       the biliary tree revealed extrahepatic  dilation with the maximal       diameter of approximately 15 mm. There were no filling defects that were       obvious.      5. A biliary sphincterotomy was made using the ERBE system with cutting       over the hydrophilic guidewire into the 12:00 orientation      6. Multiple balloon sweeps were negative. Excellent drainage.      7. Post procedure review of the radiographs with Dr. Sheliah Hatch       demonstrated a cystic duct leak Impression:               1. Dilated extrahepatic biliary tree with distal                            tapering due to the presence of a large                            periampullary diverticulum                           2. Status post ERCP with biliary sphincterotomy.                            Multiple negative balloon sweeps. Excellent drainage                           3. Pancreas divisum                           4. Status post postcholecystectomy.                           5. Probable cystic duct leak. Biliary drain in                            place. Status post sphincterotomy Recommendation:           1. Standard post ERCP management and observation                           2. Resume care  with general surgery. Monitor                            biliary drainage output.                           3. GI inpatient team informed the patient's                            procedure results as outlined above. They will                            follow the patient as needed.                           I reviewed the information with the patient's  daughter [name] [phone] Procedure Code(s):        --- Professional ---                           [phone], Endoscopic retrograde                            cholangiopancreatography (ERCP); with                            sphincterotomy/papillotomy Diagnosis Code(s):        --- Professional ---                           K83.8, Other specified diseases of biliary tract                           Z90.49, Acquired absence of other specified parts                            of digestive tract                           R94.5, Abnormal results of liver function studies CPT copyright 2019 American Medical Association. All rights reserved. The codes documented in this report are preliminary and upon coder review may  be revised to meet current compliance requirements. Wilhemina Bonito. Marina Goodell, MD 02/20/2021 3:00:22 PM This report has been signed electronically. Number of Addenda: 0

## 2021-02-20 NOTE — Progress Notes (Signed)
2 Days Post-Op   Chief Complaint/Subjective: Sore, no nausea  Review of Systems See above, otherwise other systems negative   PMH -  has a past medical history of Age-related hearing loss (10/19/2016), Cataract, Dementia with behavioral disturbance (07/02/2015), Reduced vision (09/16/2016), and Weight loss, non-intentional (04/14/2018). PSH -  has a past surgical history that includes Cataract extraction (Right, 2012) and Cholecystectomy (N/A, 02/18/2021).  [facility] - family history includes Hypertension in her mother.   Objective: Vital signs in last 24 hours: Temp:  [97.2 F (36.2 C)-100.5 F (38.1 C)] 100.5 F (38.1 C) (12/01 0816) Pulse Rate:  [68-110] 110 (12/01 0816) Resp:  [15-17] 15 (12/01 0816) BP: (98-122)/(56-77) 110/70 (12/01 0816) SpO2:  [95 %-98 %] 98 % (12/01 0816) Last BM Date: 02/16/21 Intake/Output from previous day: 11/30 0701 - 12/01 0700 In: 708 [P.O.:50; I.V.:608; IV Piggyback:50] Out: 155 [Drains:155] Intake/Output this shift: No intake/output data recorded.  PE: Gen: NAD Resp: nonlabored Card: RRR Abd: soft, incisions c/d/I, drain with serosanguinous output  Lab Results:  Recent Labs    02/19/21 0328 02/20/21 0149  WBC 10.5 11.3*  HGB 12.3 11.6*  HCT 38.8 37.1  PLT 275 278   BMET Recent Labs    02/19/21 0328 02/19/21 2015  NA 136 134*  K 3.7 3.9  CL 106 102  CO2 20* 22  GLUCOSE 120* 152*  BUN 11 7*  CREATININE 0.75 0.77  CALCIUM 8.8* 8.8*   PT/INR No results for input(s): LABPROT, INR in the last 72 hours. CMP     Component Value Date/Time   NA 134 (L) 02/19/2021 2015   NA 143 08/13/2020 1431   K 3.9 02/19/2021 2015   CL 102 02/19/2021 2015   CO2 22 02/19/2021 2015   GLUCOSE 152 (H) 02/19/2021 2015   BUN 7 (L) 02/19/2021 2015   BUN 15 08/13/2020 1431   CREATININE 0.77 02/19/2021 2015   CREATININE 0.83 06/18/2015 0845   CALCIUM 8.8 (L) 02/19/2021 2015   PROT 5.9 (L) 02/19/2021 2015   PROT 7.2 08/13/2020 1431   ALBUMIN  2.7 (L) 02/19/2021 2015   ALBUMIN 4.3 08/13/2020 1431   AST 69 (H) 02/19/2021 2015   ALT 72 (H) 02/19/2021 2015   ALKPHOS 131 (H) 02/19/2021 2015   BILITOT 0.8 02/19/2021 2015   BILITOT <0.2 08/13/2020 1431   GFRNONAA >60 02/19/2021 2015   GFRNONAA 68 06/18/2015 0845   GFRAA 66 09/12/2019 1724   GFRAA 79 06/18/2015 0845   Lipase     Component Value Date/Time   LIPASE 31 02/16/2021 1328    Studies/Results: DG Cholangiogram Operative  Result Date: 02/18/2021 CLINICAL DATA:  Intraoperative cholangiogram during laparoscopic cholecystectomy. EXAM: INTRAOPERATIVE CHOLANGIOGRAM FLUOROSCOPY TIME:  16 seconds (3.2 mGy) COMPARISON:  CT abdomen and pelvis-02/16/2021 FINDINGS: Multiple spot intraoperative cholangiographic images of the right upper abdominal quadrant during laparoscopic cholecystectomy are provided for review. Surgical clips overlie the expected location of the gallbladder fossa. Contrast injection demonstrates selective cannulation of the central aspect of the cystic duct. There is extravasation of contrast about the cystic duct cannulation site there is however passage of contrast through the central aspect of the cystic duct with filling of a markedly dilated common bile duct. There is minimal reflux of injected contrast into the common hepatic duct and central aspect of the mildly dilated intrahepatic biliary system. There is no definitive passage of contrast to the level of the distal aspect of the CBD. There is no definitive opacification of the duodenum. IMPRESSION: No definitive passage  of contrast through the distal aspect of the CBD - while potentially technique related, an ampullary stenosis/occlusion or occlusive choledocholithiasis could result in a similar appearance. Further evaluation and management with ERCP could be performed indicated. Electronically Signed   By: Simonne Come M.D.   On: 02/18/2021 14:58    Anti-infectives: Anti-infectives (From admission, onward)     Start     Dose/Rate Route Frequency Ordered Stop   02/17/21 1400  piperacillin-tazobactam (ZOSYN) IVPB 3.375 g        3.375 g 12.5 mL/hr over 240 Minutes Intravenous Every 8 hours 02/17/21 1028     02/16/21 1715  piperacillin-tazobactam (ZOSYN) IVPB 3.375 g        3.375 g 100 mL/hr over 30 Minutes Intravenous  Once 02/16/21 1701 02/16/21 2217       Assessment/Plan Persistent abdominal pain and wall thickening concerning for cholecystitis. Initial concern for choledocholithiasis but labs improved. -lap chole 11/29 Dr. Sheliah Hatch- "IOC showed dilated bile duct with concern for round structure at the ampulla and no flow of the contrast into the duodenum" -Appreciate further follow up from GI, anticipate need for ERCP though total bili currently normal -Work on pain control today, mobilize   FEN - NPO for procedure VTE - lovenox prophylaxis ID - [id number] Disposition - pending further treatment   LOS: 3 days   [facility] Surgery 02/20/2021, 11:04 AM Please see Amion for pager number during day hours 7:00am-4:30pm or 7:00am -11:30am on weekends

## 2021-02-21 DIAGNOSIS — K831 Obstruction of bile duct: Secondary | ICD-10-CM | POA: Diagnosis not present

## 2021-02-21 DIAGNOSIS — F03918 Unspecified dementia, unspecified severity, with other behavioral disturbance: Secondary | ICD-10-CM | POA: Diagnosis not present

## 2021-02-21 DIAGNOSIS — R1011 Right upper quadrant pain: Secondary | ICD-10-CM | POA: Diagnosis not present

## 2021-02-21 DIAGNOSIS — D72829 Elevated white blood cell count, unspecified: Secondary | ICD-10-CM

## 2021-02-21 DIAGNOSIS — D518 Other vitamin B12 deficiency anemias: Secondary | ICD-10-CM | POA: Diagnosis not present

## 2021-02-21 DIAGNOSIS — K810 Acute cholecystitis: Secondary | ICD-10-CM | POA: Diagnosis not present

## 2021-02-21 LAB — COMPREHENSIVE METABOLIC PANEL
ALT: 67 U/L — ABNORMAL HIGH (ref 0–44)
AST: 49 U/L — ABNORMAL HIGH (ref 15–41)
Albumin: 2.7 g/dL — ABNORMAL LOW (ref 3.5–5.0)
Alkaline Phosphatase: 232 U/L — ABNORMAL HIGH (ref 38–126)
Anion gap: 10 (ref 5–15)
BUN: 11 mg/dL (ref 8–23)
CO2: 24 mmol/L (ref 22–32)
Calcium: 9.5 mg/dL (ref 8.9–10.3)
Chloride: 105 mmol/L (ref 98–111)
Creatinine, Ser: 0.83 mg/dL (ref 0.44–1.00)
GFR, Estimated: >60 mL/min (ref >60)
Glucose, Bld: 121 mg/dL — ABNORMAL HIGH (ref 70–99)
Potassium: 4.3 mmol/L (ref 3.5–5.1)
Sodium: 139 mmol/L (ref 135–145)
Total Bilirubin: 0.9 mg/dL (ref 0.3–1.2)
Total Protein: 6.5 g/dL (ref 6.5–8.1)

## 2021-02-21 LAB — CBC
HCT: 38.7 % (ref 36.0–46.0)
Hemoglobin: 12.3 g/dL (ref 12.0–15.0)
MCH: 28.6 pg (ref 26.0–34.0)
MCHC: 31.8 g/dL (ref 30.0–36.0)
MCV: 90 fL (ref 80.0–100.0)
Platelets: 381 10*3/uL (ref 150–400)
RBC: 4.3 MIL/uL (ref 3.87–5.11)
RDW: 13.2 % (ref 11.5–15.5)
WBC: 14.4 10*3/uL — ABNORMAL HIGH (ref 4.0–10.5)
nRBC: 0 % (ref 0.0–0.2)

## 2021-02-21 MED ORDER — HALOPERIDOL LACTATE 5 MG/ML IJ SOLN
1.0000 mg | Freq: Four times a day (QID) | INTRAMUSCULAR | Status: DC | PRN
  Administered 2021-02-21: 1 mg via INTRAVENOUS
  Filled 2021-02-21: qty 1

## 2021-02-21 MED ORDER — HALOPERIDOL LACTATE 5 MG/ML IJ SOLN
2.0000 mg | Freq: Four times a day (QID) | INTRAMUSCULAR | Status: DC | PRN
  Administered 2021-02-23 – 2021-02-27 (×9): 2 mg via INTRAVENOUS
  Filled 2021-02-21 (×11): qty 1

## 2021-02-21 NOTE — Progress Notes (Signed)
[facility] PROGRESS NOTE    Progress Note  Ebony Pierce  KYH:[id number] DOB: November 11, 1935 DOA: 02/16/2021 PCP: Derrel Nip, MD     Brief Narrative:   Ebony Pierce is an 85 y.o. female past medical history significant of dementia, vitamin D B12 deficiency who comes into the ED for right upper quadrant pain was seen in the ED on 02/13/2021 with similar complaints labs were unremarkable.  She returned to the ED with the same complaint right upper quadrant ultrasound showed normal gallbladder irregular thickening of the wall and edema with up to 9 mm associated with pericolic cystic fluid positive Murphy sign surgery was consulted she is status post surgical intervention on 02/18/2021 with inflamed gangrenous gallbladder bile duct there was a concern for round stricture at the ampulla and no flow of contrast into the duodenum.    Assessment/Plan: GI on   RUQ pain in the setting of transaminitis concern for cholecystitis: Status post lap chole on 02/18/2021  GI recommended ERCP that showed dilated extrapelvic biliary tree with distal tapering due to the presence of large periampullary diverticulum status post sphincterectomy with multiple negative balloon sweeps excellent drainage. Continue Zosyn at a lower diet.  Further management per GI and surgery. Daily on narcotics for analgesics.  Feels well this morning tolerating her breakfast no nausea or vomiting.  Fever and leukocytosis:  Afebrile overnight. Continue IV Zosyn.  Culture data negative date. Antibiotic duration per general surgery  Hypokalemia: Replete orally 3.7 today.  Dementia with behavioral disturbances: At high risk of aspiration and delirium monitor closely.  Vitamin B12 deficiency: Checked this year it was stable.   DVT prophylaxis: lovenox Family Communication:none Status is: Inpatient  Remains inpatient appropriate because: Acute cholecystitis which will not require  ERCP.        Code Status:     Code Status Orders  (From admission, onward)           Start     Ordered   02/16/21 1806  Full code  Continuous        02/16/21 1809           Code Status History     This patient has a current code status but no historical code status.         IV Access:   Peripheral IV   Procedures and diagnostic studies:   DG ERCP  Result Date: Mar 16, 2021 CLINICAL DATA:  ERCP EXAM: ERCP TECHNIQUE: Multiple spot images obtained with the fluoroscopic device and submitted for interpretation post-procedure. FLUOROSCOPY TIME:  Refer to procedure report COMPARISON:  None. FINDINGS: A total of 13 fluoroscopic spot films are submitted for review taken during ERCP. Initial images demonstrate a scope overlying the right upper quadrant. Wire catheterization of the common bile duct is performed, followed by contrast injection. Cholangiography demonstrates a dilated common bile duct, with no obvious filling defects identified. Balloon sweeps of the common bile duct is then performed. Final images demonstrate normal intrahepatic biliary tree with persistently dilated common bile duct and no filling defects identified. IMPRESSION: Fluoroscopic images taken during ERCP as described. These images were submitted for radiologic interpretation only. Please see the procedural report for the amount of contrast and the fluoroscopy time utilized. Electronically Signed   By: Olive Bass M.D.   On: Mar 16, 2021 15:07     Medical Consultants:   None.   Subjective:    Ebony Pierce awake this morning no complaints tolerated her breakfast. Objective:    Vitals:   Mar 16, 2021 1608 02/21/21  0016 02/21/21 0358 02/21/21 0729  BP: 136/68 124/67 135/67 121/71  Pulse: 69 (!) 58 (!) 56 63  Resp: 16 18 18 15   Temp: (!) 97.5 F (36.4 C) (!) 97.5 F (36.4 C) 98.7 F (37.1 C) (!) 97.4 F (36.3 C)  TempSrc: Oral   Oral  SpO2: 96% 100% 100% 100%  Height:       SpO2:  100 % O2 Flow Rate (L/min): 2 L/min   Intake/Output Summary (Last 24 hours) at 02/21/2021 1030 Last data filed at 02/21/2021 0500 Gross per 24 hour  Intake 440 ml  Output 26 ml  Net 414 ml    There were no vitals filed for this visit.  Exam: General exam: In no acute distress. Respiratory system: Good air movement and clear to auscultation. Cardiovascular system: S1 & S2 heard, RRR. No JVD. Gastrointestinal system: Abdomen is nondistended, soft and nontender.  Extremities: No pedal edema. Skin: No rashes, lesions or ulcers   Data Reviewed:    Labs: Basic Metabolic Panel: Recent Labs  Lab 02/17/21 0344 02/18/21 0116 02/19/21 0328 02/19/21 2015 02/21/21 0750  NA 133* 140 136 134* 139  K 3.3* 3.4* 3.7 3.9 4.3  CL 100 105 106 102 105  CO2 25 25 20* 22 24  GLUCOSE 113* 106* 120* 152* 121*  BUN 14 13 11  7* 11  CREATININE 0.88 0.89 0.75 0.77 0.83  CALCIUM 9.0 9.4 8.8* 8.8* 9.5    GFR Estimated Creatinine Clearance: 44 mL/min (by C-G formula based on SCr of 0.83 mg/dL). Liver Function Tests: Recent Labs  Lab 02/17/21 0344 02/18/21 0116 02/19/21 0328 02/19/21 2015 02/21/21 0750  AST 92* 41 95* 69* 49*  ALT 95* 67* 77* 72* 67*  ALKPHOS 179* 177* 134* 131* 232*  BILITOT 0.8 1.0 1.0 0.8 0.9  PROT 6.5 6.5 5.6* 5.9* 6.5  ALBUMIN 2.9* 2.9* 2.7* 2.7* 2.7*    Recent Labs  Lab 02/16/21 1328  LIPASE 31    No results for input(s): AMMONIA in the last 168 hours. Coagulation profile Recent Labs  Lab 02/16/21 2044  INR 1.1    COVID-19 Labs  No results for input(s): DDIMER, FERRITIN, LDH, CRP in the last 72 hours.  Lab Results  Component Value Date   [place] NEGATIVE 02/16/2021    CBC: Recent Labs  Lab 02/16/21 1328 02/17/21 0344 02/18/21 0116 02/18/21 1823 02/19/21 0328 02/20/21 0149 02/21/21 0750  WBC 12.3* 7.3 5.8  --  10.5 11.3* 14.4*  NEUTROABS 9.8*  --   --   --   --  8.7*  --   HGB 14.6 13.6 13.0 12.9 12.3 11.6* 12.3  HCT 45.0 40.6  40.0 41.1 38.8 37.1 38.7  MCV 90.4 87.3 88.9  --  92.4 90.7 90.0  PLT 279 286 334  --  275 278 381    Cardiac Enzymes: No results for input(s): CKTOTAL, CKMB, CKMBINDEX, TROPONINI in the last 168 hours. BNP (last 3 results) No results for input(s): PROBNP in the last 8760 hours. CBG: No results for input(s): GLUCAP in the last 168 hours. D-Dimer: No results for input(s): DDIMER in the last 72 hours. Hgb A1c: No results for input(s): HGBA1C in the last 72 hours. Lipid Profile: No results for input(s): CHOL, HDL, LDLCALC, TRIG, CHOLHDL, LDLDIRECT in the last 72 hours. Thyroid function studies: No results for input(s): TSH, T4TOTAL, T3FREE, THYROIDAB in the last 72 hours.  Invalid input(s): FREET3 Anemia work up: No results for input(s): VITAMINB12, FOLATE, FERRITIN, TIBC, IRON, RETICCTPCT in the  last 72 hours. Sepsis Labs: Recent Labs  Lab 02/18/21 0116 02/19/21 0328 02/20/21 0149 02/21/21 0750  WBC 5.8 10.5 11.3* 14.4*    Microbiology Recent Results (from the past 240 hour(s))  Resp Panel by RT-PCR (Flu A&B, Covid) Nasopharyngeal Swab     Status: None   Collection Time: 02/16/21 10:38 PM   Specimen: Nasopharyngeal Swab; Nasopharyngeal(NP) swabs in vial transport medium  Result Value Ref Range Status   SARS Coronavirus 2 by RT PCR NEGATIVE NEGATIVE Final    Comment: (NOTE) SARS-CoV-2 target nucleic acids are NOT DETECTED.  The SARS-CoV-2 RNA is generally detectable in upper respiratory specimens during the acute phase of infection. The lowest concentration of SARS-CoV-2 viral copies this assay can detect is 138 copies/mL. A negative result does not preclude SARS-Cov-2 infection and should not be used as the sole basis for treatment or other patient management decisions. A negative result may occur with  improper specimen collection/handling, submission of specimen other than nasopharyngeal swab, presence of viral mutation(s) within the areas targeted by this assay,  and inadequate number of viral copies(<138 copies/mL). A negative result must be combined with clinical observations, patient history, and epidemiological information. The expected result is Negative.  Fact Sheet for Patients:  EntrepreneurPulse.com.au  Fact Sheet for Healthcare Providers:  IncredibleEmployment.be  This test is no t yet approved or cleared by the [place] FDA and  has been authorized for detection and/or diagnosis of SARS-CoV-2 by FDA under an Emergency Use Authorization (EUA). This EUA will remain  in effect (meaning this test can be used) for the duration of the COVID-19 declaration under Section 564(b)(1) of the Act, 21 U.S.C.section 360bbb-3(b)(1), unless the authorization is terminated  or revoked sooner.       Influenza A by PCR NEGATIVE NEGATIVE Final   Influenza B by PCR NEGATIVE NEGATIVE Final    Comment: (NOTE) The Xpert Xpress SARS-CoV-2/FLU/RSV plus assay is intended as an aid in the diagnosis of influenza from Nasopharyngeal swab specimens and should not be used as a sole basis for treatment. Nasal washings and aspirates are unacceptable for Xpert Xpress SARS-CoV-2/FLU/RSV testing.  Fact Sheet for Patients: EntrepreneurPulse.com.au  Fact Sheet for Healthcare Providers: IncredibleEmployment.be  This test is not yet approved or cleared by the [place] FDA and has been authorized for detection and/or diagnosis of SARS-CoV-2 by FDA under an Emergency Use Authorization (EUA). This EUA will remain in effect (meaning this test can be used) for the duration of the COVID-19 declaration under Section 564(b)(1) of the Act, 21 U.S.C. section 360bbb-3(b)(1), unless the authorization is terminated or revoked.  Performed at [facility], [place] [address] [place], [place] [address]   Culture, blood (Routine X 2) w Reflex to ID Panel     Status: None (Preliminary result)    Collection Time: 02/20/21 10:37 AM   Specimen: BLOOD RIGHT HAND  Result Value Ref Range Status   Specimen Description BLOOD RIGHT HAND  Final   Special Requests   Final    BOTTLES DRAWN AEROBIC ONLY Blood Culture results may not be optimal due to an inadequate volume of blood received in culture bottles   Culture   Final    NO GROWTH < 24 HOURS Performed at [facility], [place] [address], [place], [place] [address]    Report Status PENDING  Incomplete  Culture, blood (Routine X 2) w Reflex to ID Panel     Status: None (Preliminary result)   Collection Time: 02/20/21 10:45 AM  Specimen: BLOOD  Result Value Ref Range Status   Specimen Description BLOOD RIGHT ANTECUBITAL  Final   Special Requests   Final    BOTTLES DRAWN AEROBIC ONLY Blood Culture results may not be optimal due to an inadequate volume of blood received in culture bottles   Culture   Final    NO GROWTH < 24 HOURS Performed at [place] [address] [place], [place] [address]    Report Status PENDING  Incomplete     Medications:    acetaminophen  1,000 mg Oral Q6H   enoxaparin (LOVENOX) injection  40 mg Subcutaneous Q24H   melatonin  3 mg Oral QHS   sodium chloride flush  3 mL Intravenous Q12H   Continuous Infusions:  sodium chloride     methocarbamol (ROBAXIN) IV     piperacillin-tazobactam (ZOSYN)  IV 3.375 g (02/21/21 0456)      LOS: 4 days   Charlynne Cousins  [facility]  02/21/2021, 10:30 AM

## 2021-02-21 NOTE — Progress Notes (Signed)
Pharmacy Antibiotic Note- Follow-up  Ebony Pierce is a 85 y.o. female admitted on 02/16/2021 with intra-abdominal infection.  Pharmacy has been consulted for zosyn dosing. S/P lap Chole 02/18/2021  Plan: Zosyn 3.375g IV q8h (4 hour infusion). Monitor for clinical course, deescalation and LOT LOT per surgery  Height: 5' (152.4 cm) IBW/kg (Calculated) : 45.5  Temp (24hrs), Avg:97.8 F (36.6 C), Min:97.4 F (36.3 C), Max:98.7 F (37.1 C)  Recent Labs  Lab 02/17/21 0344 02/18/21 0116 02/19/21 0328 02/19/21 2015 02/20/21 0149 02/21/21 0750  WBC 7.3 5.8 10.5  --  11.3* 14.4*  CREATININE 0.88 0.89 0.75 0.77  --  0.83     Estimated Creatinine Clearance: 44 mL/min (by C-G formula based on SCr of 0.83 mg/dL).    Allergies  Allergen Reactions   Shellfish Allergy Swelling and Rash    Antimicrobials this admission: 11/28 zosyn >> .  Microbiology: 12/1 Blood culture X2 NGTD (<24 hours)               *Note- these may result in sub-optimal organism recovery given low volume of blood collected*   Dose adjustments this admission:    Greta Doom BS, PharmD, BCPS Clinical Pharmacist [place] Please utilize Amion for appropriate phone number to reach the unit pharmacist [facility])  02/21/2021 12:26 PM

## 2021-02-21 NOTE — Anesthesia Postprocedure Evaluation (Signed)
Anesthesia Post Note  Patient: Ebony Pierce  Procedure(s) Performed: ENDOSCOPIC RETROGRADE CHOLANGIOPANCREATOGRAPHY (ERCP) WITH PROPOFOL SPHINCTEROTOMY REMOVAL OF STONES     Patient location during evaluation: PACU Anesthesia Type: General Level of consciousness: awake and alert Pain management: pain level controlled Vital Signs Assessment: post-procedure vital signs reviewed and stable Respiratory status: spontaneous breathing, nonlabored ventilation, respiratory function stable and patient connected to nasal cannula oxygen Cardiovascular status: blood pressure returned to baseline and stable Postop Assessment: no apparent nausea or vomiting Anesthetic complications: no   No notable events documented.  Last Vitals:  Vitals:   02/21/21 1207 02/21/21 1652  BP: (!) 91/51 (!) 157/143  Pulse: 62 68  Resp: 16 17  Temp: 36.6 C 36.5 C  SpO2: (!) 81% 97%    Last Pain:  Vitals:   02/21/21 1652  TempSrc: Oral  PainSc:    Pain Goal:                   Anneke Cundy S

## 2021-02-21 NOTE — Progress Notes (Signed)
[facility] Progress Note  1 Day Post-Op  Subjective: CC:  Frequent BMs. Pain seems controlled. Per daughter, no nausea or vomiting.  Objective: Vital signs in last 24 hours: Temp:  [97.4 F (36.3 C)-98.7 F (37.1 C)] 97.4 F (36.3 C) (12/02 0729) Pulse Rate:  [56-70] 63 (12/02 0729) Resp:  [15-22] 15 (12/02 0729) BP: (88-136)/(52-86) 121/71 (12/02 0729) SpO2:  [96 %-100 %] 100 % (12/02 0729) Last BM Date: 02/21/21  Intake/Output from previous day: 12/01 0701 - 12/02 0700 In: 440 [P.O.:240; I.V.:200] Out: 26 [Drains:25; Stool:1] Intake/Output this shift: Total I/O In: 240 [P.O.:240] Out: -   PE: Gen:  Alert, NAD, pleasant Pulm:  Normal effort ORA Abd: Soft, appropriately tender, nondistended, incisions c/d/I, JP with sanguinous drainage Skin: warm and dry, no rashes  Psych: A&Ox1  Lab Results:  Recent Labs    02/20/21 0149 02/21/21 0750  WBC 11.3* 14.4*  HGB 11.6* 12.3  HCT 37.1 38.7  PLT 278 381   BMET Recent Labs    02/19/21 2015 02/21/21 0750  NA 134* 139  K 3.9 4.3  CL 102 105  CO2 22 24  GLUCOSE 152* 121*  BUN 7* 11  CREATININE 0.77 0.83  CALCIUM 8.8* 9.5   PT/INR No results for input(s): LABPROT, INR in the last 72 hours. CMP     Component Value Date/Time   NA 139 02/21/2021 0750   NA 143 08/13/2020 1431   K 4.3 02/21/2021 0750   CL 105 02/21/2021 0750   CO2 24 02/21/2021 0750   GLUCOSE 121 (H) 02/21/2021 0750   BUN 11 02/21/2021 0750   BUN 15 08/13/2020 1431   CREATININE 0.83 02/21/2021 0750   CREATININE 0.83 06/18/2015 0845   CALCIUM 9.5 02/21/2021 0750   PROT 6.5 02/21/2021 0750   PROT 7.2 08/13/2020 1431   ALBUMIN 2.7 (L) 02/21/2021 0750   ALBUMIN 4.3 08/13/2020 1431   AST 49 (H) 02/21/2021 0750   ALT 67 (H) 02/21/2021 0750   ALKPHOS 232 (H) 02/21/2021 0750   BILITOT 0.9 02/21/2021 0750   BILITOT <0.2 08/13/2020 1431   GFRNONAA >60 02/21/2021 0750   GFRNONAA 68 06/18/2015 0845   GFRAA 66 09/12/2019 1724    GFRAA 79 06/18/2015 0845   Lipase     Component Value Date/Time   LIPASE 31 02/16/2021 1328       Studies/Results: DG ERCP  Result Date: 02/20/2021 CLINICAL DATA:  ERCP EXAM: ERCP TECHNIQUE: Multiple spot images obtained with the fluoroscopic device and submitted for interpretation post-procedure. FLUOROSCOPY TIME:  Refer to procedure report COMPARISON:  None. FINDINGS: A total of 13 fluoroscopic spot films are submitted for review taken during ERCP. Initial images demonstrate a scope overlying the right upper quadrant. Wire catheterization of the common bile duct is performed, followed by contrast injection. Cholangiography demonstrates a dilated common bile duct, with no obvious filling defects identified. Balloon sweeps of the common bile duct is then performed. Final images demonstrate normal intrahepatic biliary tree with persistently dilated common bile duct and no filling defects identified. IMPRESSION: Fluoroscopic images taken during ERCP as described. These images were submitted for radiologic interpretation only. Please see the procedural report for the amount of contrast and the fluoroscopy time utilized. Electronically Signed   By: Olive Bass M.D.   On: 02/20/2021 15:07    Anti-infectives: Anti-infectives (From admission, onward)    Start     Dose/Rate Route Frequency Ordered Stop   02/17/21 1400  piperacillin-tazobactam (ZOSYN) IVPB 3.375 g  3.375 g 12.5 mL/hr over 240 Minutes Intravenous Every 8 hours 02/17/21 1028     02/16/21 1715  piperacillin-tazobactam (ZOSYN) IVPB 3.375 g        3.375 g 100 mL/hr over 30 Minutes Intravenous  Once 02/16/21 1701 02/16/21 2217        Assessment/Plan Acute calculous cholecystitis with concern for choledocholithiasis -lap chole 11/29 Dr. Sheliah Hatch- "IOC showed dilated bile duct with concern for round structure at the ampulla and no flow of the contrast into the duodenum" - ERCP 12/1 w/out choledocholithiasis but with  possible cystic stump leak - patient without obvious bile in JP drain today. Continue to monitor - HH diet and monitor bowel function  - CBC and CMP in AM   FEN: HH VTE: lovenox  Foley: none  ID: Zosyn Dispo: inpatient    LOS: 4 days    Hosie Spangle, [facility] Please see Amion for pager number during day hours 7:00am-4:30pm

## 2021-02-21 NOTE — Progress Notes (Addendum)
Patient ID: Ebony Pierce, female   DOB: 1935/05/29, 85 y.o.   MRN: [id number]     Attending physician's note   I have taken an interval history, reviewed the chart and examined the patient. I agree with the Advanced Practitioner's note, impression, and recommendations as outlined.   [address], DO, FACG [phone] office          Progress Note   Subjective  Day #4 Chief complaint; acute abdominal pain Status post lap chole 02/18/2021, choledocholithiasis per IOC  ERCP yesterday-no choledocholithiasis, periampullary diverticulum accounting for the abnormality noted on IOC, sphincterotomy done  JP effluent still looks bloody-drain output 25 cc no overt bilious appearance  IV Zosyn  Daughter at bedside, interprets for patient-patient is feeling better today, less abdominal discomfort, tolerating diet and would like solid food.  No nausea or vomiting  Labs-WBC up to 14.4/hemoglobin 12.3 stable T bili 0.9/alk phos 232/AST 49/ALT 67 stable      Objective   Vital signs in last 24 hours: Temp:  [97.4 F (36.3 C)-98.7 F (37.1 C)] 97.4 F (36.3 C) (12/02 0729) Pulse Rate:  [56-70] 63 (12/02 0729) Resp:  [15-22] 15 (12/02 0729) BP: (88-136)/(52-86) 121/71 (12/02 0729) SpO2:  [96 %-100 %] 100 % (12/02 0729) Last BM Date: 02/20/21 General:    elderly Asian female in NAD Heart:  Regular rate and rhythm; no murmurs Lungs: Respirations even and unlabored, lungs CTA bilaterally Abdomen:  Soft, mildly tender in the upper abdomen, incisional ports benign nondistended. Normal bowel sounds.  JP right upper quadrant Extremities:  Without edema. Neurologic:  Alert and oriented,  grossly normal neurologically. Psych:  Cooperative. Normal mood and affect.  Intake/Output from previous day: 12/01 0701 - 12/02 0700 In: 440 [P.O.:240; I.V.:200] Out: 26 [Drains:25; Stool:1] Intake/Output this shift: No intake/output data recorded.  Lab Results: Recent Labs     02/19/21 0328 02/20/21 0149 02/21/21 0750  WBC 10.5 11.3* 14.4*  HGB 12.3 11.6* 12.3  HCT 38.8 37.1 38.7  PLT 275 278 381   BMET Recent Labs    02/19/21 0328 02/19/21 2015 02/21/21 0750  NA 136 134* 139  K 3.7 3.9 4.3  CL 106 102 105  CO2 20* 22 24  GLUCOSE 120* 152* 121*  BUN 11 7* 11  CREATININE 0.75 0.77 0.83  CALCIUM 8.8* 8.8* 9.5   LFT Recent Labs    02/21/21 0750  PROT 6.5  ALBUMIN 2.7*  AST 49*  ALT 67*  ALKPHOS 232*  BILITOT 0.9   PT/INR No results for input(s): LABPROT, INR in the last 72 hours.  Studies/Results: DG ERCP  Result Date: 02/20/2021 CLINICAL DATA:  ERCP EXAM: ERCP TECHNIQUE: Multiple spot images obtained with the fluoroscopic device and submitted for interpretation post-procedure. FLUOROSCOPY TIME:  Refer to procedure report COMPARISON:  None. FINDINGS: A total of 13 fluoroscopic spot films are submitted for review taken during ERCP. Initial images demonstrate a scope overlying the right upper quadrant. Wire catheterization of the common bile duct is performed, followed by contrast injection. Cholangiography demonstrates a dilated common bile duct, with no obvious filling defects identified. Balloon sweeps of the common bile duct is then performed. Final images demonstrate normal intrahepatic biliary tree with persistently dilated common bile duct and no filling defects identified. IMPRESSION: Fluoroscopic images taken during ERCP as described. These images were submitted for radiologic interpretation only. Please see the procedural report for the amount of contrast and the fluoroscopy time utilized. Electronically Signed   By: Scherrie Bateman.D.  On: 02/20/2021 15:07       Assessment / Plan:    #64 85 year old Asian female with acute calculus cholecystitis, status post laparoscopic cholecystectomy 02/18/2021 Concern for choledocholithiasis on IOC  Patient is status post ERCP yesterday with no choledocholithiasis found, periampullary  diverticulum felt to be responsible for findings at [facility]. Sphincterotomy was done  On review of images per Dr. Perry/Dr. Alinda Dooms noted to have faint area of extravasation consistent with small cystic duct leak.   Patient has been stable, feeling better though WBC is on the rise. No obvious bilious output per JP  Plan; continue IV Zosyn Repeat labs in a.m. Carefully monitor JP output, if she has any overt bilious output or fails to have decreasing output over the next few days then could require repeat ERCP with stent. GI will continue to follow, Dr. Benson Norway is on this weekend        Principal Problem:   RUQ pain Active Problems:   Dementia with behavioral disturbance   B12 deficiency anemia   Elevated liver enzymes   Abnormal gallbladder ultrasound   Leukocytosis   Cholecystitis, acute   Biliary obstruction     LOS: 4 days   Amy Esterwood PA-C 02/21/2021, 9:31 AM

## 2021-02-22 DIAGNOSIS — R1011 Right upper quadrant pain: Secondary | ICD-10-CM | POA: Diagnosis not present

## 2021-02-22 DIAGNOSIS — K831 Obstruction of bile duct: Secondary | ICD-10-CM | POA: Diagnosis not present

## 2021-02-22 DIAGNOSIS — F03918 Unspecified dementia, unspecified severity, with other behavioral disturbance: Secondary | ICD-10-CM | POA: Diagnosis not present

## 2021-02-22 DIAGNOSIS — K810 Acute cholecystitis: Secondary | ICD-10-CM | POA: Diagnosis not present

## 2021-02-22 LAB — CBC
HCT: 33.4 % — ABNORMAL LOW (ref 36.0–46.0)
Hemoglobin: 10.7 g/dL — ABNORMAL LOW (ref 12.0–15.0)
MCH: 28.7 pg (ref 26.0–34.0)
MCHC: 32 g/dL (ref 30.0–36.0)
MCV: 89.5 fL (ref 80.0–100.0)
Platelets: 410 10*3/uL — ABNORMAL HIGH (ref 150–400)
RBC: 3.73 MIL/uL — ABNORMAL LOW (ref 3.87–5.11)
RDW: 13.3 % (ref 11.5–15.5)
WBC: 10.5 10*3/uL (ref 4.0–10.5)
nRBC: 0 % (ref 0.0–0.2)

## 2021-02-22 LAB — COMPREHENSIVE METABOLIC PANEL
ALT: 47 U/L — ABNORMAL HIGH (ref 0–44)
AST: 30 U/L (ref 15–41)
Albumin: 2.5 g/dL — ABNORMAL LOW (ref 3.5–5.0)
Alkaline Phosphatase: 170 U/L — ABNORMAL HIGH (ref 38–126)
Anion gap: 8 (ref 5–15)
BUN: 12 mg/dL (ref 8–23)
CO2: 25 mmol/L (ref 22–32)
Calcium: 9.1 mg/dL (ref 8.9–10.3)
Chloride: 104 mmol/L (ref 98–111)
Creatinine, Ser: 0.84 mg/dL (ref 0.44–1.00)
GFR, Estimated: >60 mL/min (ref >60)
Glucose, Bld: 122 mg/dL — ABNORMAL HIGH (ref 70–99)
Potassium: 3.5 mmol/L (ref 3.5–5.1)
Sodium: 137 mmol/L (ref 135–145)
Total Bilirubin: 0.4 mg/dL (ref 0.3–1.2)
Total Protein: 6 g/dL — ABNORMAL LOW (ref 6.5–8.1)

## 2021-02-22 LAB — LIPASE, BLOOD: Lipase: 27 U/L (ref 11–51)

## 2021-02-22 MED ORDER — POTASSIUM CHLORIDE 20 MEQ PO PACK
40.0000 meq | PACK | Freq: Two times a day (BID) | ORAL | Status: AC
  Administered 2021-02-22 (×2): 40 meq via ORAL
  Filled 2021-02-22 (×2): qty 2

## 2021-02-22 MED ORDER — QUETIAPINE FUMARATE 25 MG PO TABS
50.0000 mg | ORAL_TABLET | Freq: Every evening | ORAL | Status: DC | PRN
  Administered 2021-02-22 – 2021-02-27 (×5): 50 mg via ORAL
  Filled 2021-02-22 (×6): qty 2

## 2021-02-22 NOTE — Progress Notes (Signed)
2 Days Post-Op   Subjective/Chief Complaint: More abdominal pain this am    Objective: Vital signs in last 24 hours: Temp:  [97.4 F (36.3 C)-97.8 F (36.6 C)] 97.4 F (36.3 C) (12/03 0742) Pulse Rate:  [62-68] 67 (12/03 0742) Resp:  [16-18] 18 (12/03 0742) BP: (90-157)/(49-143) 130/79 (12/03 0742) SpO2:  [81 %-97 %] 96 % (12/02 2027) Last BM Date: 02/21/21  Intake/Output from previous day: 12/02 0701 - 12/03 0700 In: 822.6 [P.O.:600; IV Piggyback:222.6] Out: 20 [Drains:20] Intake/Output this shift: No intake/output data recorded.   Gen:  Alert, NAD, pleasant Pulm:  Normal effort ORA Abd: Soft, more tender without peritonitis .  incisions c/d/I, JP with sanguinous drainage Skin: warm and dry, no rashes  Psych: A&Ox1 Lab Results:  Recent Labs    02/21/21 0750 02/22/21 0054  WBC 14.4* 10.5  HGB 12.3 10.7*  HCT 38.7 33.4*  PLT 381 410*   BMET Recent Labs    02/21/21 0750 02/22/21 0054  NA 139 137  K 4.3 3.5  CL 105 104  CO2 24 25  GLUCOSE 121* 122*  BUN 11 12  CREATININE 0.83 0.84  CALCIUM 9.5 9.1   PT/INR No results for input(s): LABPROT, INR in the last 72 hours. ABG No results for input(s): PHART, HCO3 in the last 72 hours.  Invalid input(s): PCO2, PO2  Studies/Results: DG ERCP  Result Date: 02/20/2021 CLINICAL DATA:  ERCP EXAM: ERCP TECHNIQUE: Multiple spot images obtained with the fluoroscopic device and submitted for interpretation post-procedure. FLUOROSCOPY TIME:  Refer to procedure report COMPARISON:  None. FINDINGS: A total of 13 fluoroscopic spot films are submitted for review taken during ERCP. Initial images demonstrate a scope overlying the right upper quadrant. Wire catheterization of the common bile duct is performed, followed by contrast injection. Cholangiography demonstrates a dilated common bile duct, with no obvious filling defects identified. Balloon sweeps of the common bile duct is then performed. Final images demonstrate normal  intrahepatic biliary tree with persistently dilated common bile duct and no filling defects identified. IMPRESSION: Fluoroscopic images taken during ERCP as described. These images were submitted for radiologic interpretation only. Please see the procedural report for the amount of contrast and the fluoroscopy time utilized. Electronically Signed   By: Olive Bass M.D.   On: 02/20/2021 15:07    Anti-infectives: Anti-infectives (From admission, onward)    Start     Dose/Rate Route Frequency Ordered Stop   02/17/21 1400  piperacillin-tazobactam (ZOSYN) IVPB 3.375 g        3.375 g 12.5 mL/hr over 240 Minutes Intravenous Every 8 hours 02/17/21 1028     02/16/21 1715  piperacillin-tazobactam (ZOSYN) IVPB 3.375 g        3.375 g 100 mL/hr over 30 Minutes Intravenous  Once 02/16/21 1701 02/16/21 2217       Assessment/Plan: calculous cholecystitis with concern for choledocholithiasis -lap chole 11/29 Dr. Sheliah Hatch- "IOC showed dilated bile duct with concern for round structure at the ampulla and no flow of the contrast into the duodenum" - ERCP 12/1 w/out choledocholithiasis but with possible cystic stump leak - patient without obvious bile in JP drain today.  She is having more diffuse abdominal pain but abdomen soft CT to assess for  undrained fluid and check lipase to exclude pancreatitis  - HH diet and monitor bowel function  - CBC and CMP in AM    FEN: HH VTE: lovenox  Foley: none  ID: Zosyn   LOS: 5 days    Clovis Pu  Kura Bethards MD  02/22/2021

## 2021-02-22 NOTE — Progress Notes (Signed)
[facility] PROGRESS NOTE    Progress Note  Ebony Pierce  [id number] DOB: 27-Nov-1935 DOA: 02/16/2021 PCP: Gifford Shave, MD     Brief Narrative:   Ebony Pierce is an 85 y.o. female past medical history significant of dementia, vitamin D B12 deficiency who comes into the ED for right upper quadrant pain was seen in the ED on 02/13/2021 with similar complaints labs were unremarkable.  She returned to the ED with the same complaint right upper quadrant ultrasound showed normal gallbladder irregular thickening of the wall and edema with up to 9 mm associated with pericolic cystic fluid positive Murphy sign surgery was consulted she is status post surgical intervention on 02/18/2021 with inflamed gangrenous gallbladder bile duct there was a concern for round stricture at the ampulla and no flow of contrast into the duodenum.    Assessment/Plan: GI on   RUQ pain in the setting of transaminitis concern for cholecystitis: Status post lap chole on 02/18/2021  GI recommended ERCP that showed large periampullary diverticulum status post sphincterotomy, no gallstone extracted.  Continue Zosyn, further management per GI general surgery. Continue narcotics for analgesics. No nausea tolerating her diet.  Fever and leukocytosis:  Afebrile overnight. Continue IV Zosyn.  Culture data negative date. Antibiotic duration per general surgery  Hypokalemia: Replete orally 3.7 today.  Dementia with behavioral disturbances: At high risk of aspiration and delirium monitor closely.  Vitamin B12 deficiency: Checked this year it was stable.   DVT prophylaxis: lovenox Family Communication:none Status is: Inpatient  Remains inpatient appropriate because: Acute cholecystitis which will not require ERCP.      Code Status:     Code Status Orders  (From admission, onward)           Start     Ordered   02/16/21 1806  Full code  Continuous        02/16/21 1809            Code Status History     This patient has a current code status but no historical code status.         IV Access:   Peripheral IV   Procedures and diagnostic studies:   DG ERCP  Result Date: 27-Feb-2021 CLINICAL DATA:  ERCP EXAM: ERCP TECHNIQUE: Multiple spot images obtained with the fluoroscopic device and submitted for interpretation post-procedure. FLUOROSCOPY TIME:  Refer to procedure report COMPARISON:  None. FINDINGS: A total of 13 fluoroscopic spot films are submitted for review taken during ERCP. Initial images demonstrate a scope overlying the right upper quadrant. Wire catheterization of the common bile duct is performed, followed by contrast injection. Cholangiography demonstrates a dilated common bile duct, with no obvious filling defects identified. Balloon sweeps of the common bile duct is then performed. Final images demonstrate normal intrahepatic biliary tree with persistently dilated common bile duct and no filling defects identified. IMPRESSION: Fluoroscopic images taken during ERCP as described. These images were submitted for radiologic interpretation only. Please see the procedural report for the amount of contrast and the fluoroscopy time utilized. Electronically Signed   By: Albin Felling M.D.   On: 2021/02/27 15:07     Medical Consultants:   None.   Subjective:    Ebony Pierce no complaints this morning.  Tolerating her diet. Objective:    Vitals:   02/21/21 1207 02/21/21 1652 02/21/21 2027 02/22/21 0742  BP: (!) 91/51 (!) 157/143 (!) 90/49 130/79  Pulse: 62 68 65 67  Resp: 16 17 17 18   Temp: 97.8 F (  36.6 C) 97.7 F (36.5 C) (!) 97.5 F (36.4 C) (!) 97.4 F (36.3 C)  TempSrc: Oral Oral Oral Oral  SpO2: (!) 81% 97% 96%   Height:       SpO2: 96 % O2 Flow Rate (L/min): 2 L/min   Intake/Output Summary (Last 24 hours) at 02/22/2021 0853 Last data filed at 02/22/2021 0400 Gross per 24 hour  Intake 822.56 ml  Output 20 ml  Net  802.56 ml    There were no vitals filed for this visit.  Exam: General exam: In no acute distress. Respiratory system: Good air movement and clear to auscultation. Cardiovascular system: S1 & S2 heard, RRR. No JVD. Gastrointestinal system: Abdomen is nondistended, soft and nontender.  Extremities: No pedal edema. Skin: No rashes, lesions or ulcers  Data Reviewed:    Labs: Basic Metabolic Panel: Recent Labs  Lab 02/18/21 0116 02/19/21 0328 02/19/21 2015 02/21/21 0750 02/22/21 0054  NA 140 136 134* 139 137  K 3.4* 3.7 3.9 4.3 3.5  CL 105 106 102 105 104  CO2 25 20* 22 24 25   GLUCOSE 106* 120* 152* 121* 122*  BUN 13 11 7* 11 12  CREATININE 0.89 0.75 0.77 0.83 0.84  CALCIUM 9.4 8.8* 8.8* 9.5 9.1    GFR Estimated Creatinine Clearance: 43.5 mL/min (by C-G formula based on SCr of 0.84 mg/dL). Liver Function Tests: Recent Labs  Lab 02/18/21 0116 02/19/21 0328 02/19/21 2015 02/21/21 0750 02/22/21 0054  AST 41 95* 69* 49* 30  ALT 67* 77* 72* 67* 47*  ALKPHOS 177* 134* 131* 232* 170*  BILITOT 1.0 1.0 0.8 0.9 0.4  PROT 6.5 5.6* 5.9* 6.5 6.0*  ALBUMIN 2.9* 2.7* 2.7* 2.7* 2.5*    Recent Labs  Lab 02/16/21 1328  LIPASE 31    No results for input(s): AMMONIA in the last 168 hours. Coagulation profile Recent Labs  Lab 02/16/21 2044  INR 1.1    COVID-19 Labs  No results for input(s): DDIMER, FERRITIN, LDH, CRP in the last 72 hours.  Lab Results  Component Value Date   [place] NEGATIVE 02/16/2021    CBC: Recent Labs  Lab 02/16/21 1328 02/17/21 0344 02/18/21 0116 02/18/21 1823 02/19/21 0328 02/20/21 0149 02/21/21 0750 02/22/21 0054  WBC 12.3*   < > 5.8  --  10.5 11.3* 14.4* 10.5  NEUTROABS 9.8*  --   --   --   --  8.7*  --   --   HGB 14.6   < > 13.0 12.9 12.3 11.6* 12.3 10.7*  HCT 45.0   < > 40.0 41.1 38.8 37.1 38.7 33.4*  MCV 90.4   < > 88.9  --  92.4 90.7 90.0 89.5  PLT 279   < > 334  --  275 278 381 410*   < > = values in this interval  not displayed.    Cardiac Enzymes: No results for input(s): CKTOTAL, CKMB, CKMBINDEX, TROPONINI in the last 168 hours. BNP (last 3 results) No results for input(s): PROBNP in the last 8760 hours. CBG: No results for input(s): GLUCAP in the last 168 hours. D-Dimer: No results for input(s): DDIMER in the last 72 hours. Hgb A1c: No results for input(s): HGBA1C in the last 72 hours. Lipid Profile: No results for input(s): CHOL, HDL, LDLCALC, TRIG, CHOLHDL, LDLDIRECT in the last 72 hours. Thyroid function studies: No results for input(s): TSH, T4TOTAL, T3FREE, THYROIDAB in the last 72 hours.  Invalid input(s): FREET3 Anemia work up: No results for input(s): VITAMINB12, FOLATE,  FERRITIN, TIBC, IRON, RETICCTPCT in the last 72 hours. Sepsis Labs: Recent Labs  Lab 02/19/21 0328 02/20/21 0149 02/21/21 0750 02/22/21 0054  WBC 10.5 11.3* 14.4* 10.5    Microbiology Recent Results (from the past 240 hour(s))  Resp Panel by RT-PCR (Flu A&B, Covid) Nasopharyngeal Swab     Status: None   Collection Time: 02/16/21 10:38 PM   Specimen: Nasopharyngeal Swab; Nasopharyngeal(NP) swabs in vial transport medium  Result Value Ref Range Status   SARS Coronavirus 2 by RT PCR NEGATIVE NEGATIVE Final    Comment: (NOTE) SARS-CoV-2 target nucleic acids are NOT DETECTED.  The SARS-CoV-2 RNA is generally detectable in upper respiratory specimens during the acute phase of infection. The lowest concentration of SARS-CoV-2 viral copies this assay can detect is 138 copies/mL. A negative result does not preclude SARS-Cov-2 infection and should not be used as the sole basis for treatment or other patient management decisions. A negative result may occur with  improper specimen collection/handling, submission of specimen other than nasopharyngeal swab, presence of viral mutation(s) within the areas targeted by this assay, and inadequate number of viral copies(<138 copies/mL). A negative result must be  combined with clinical observations, patient history, and epidemiological information. The expected result is Negative.  Fact Sheet for Patients:  BloggerCourse.com  Fact Sheet for Healthcare Providers:  SeriousBroker.it  This test is no t yet approved or cleared by the [place] FDA and  has been authorized for detection and/or diagnosis of SARS-CoV-2 by FDA under an Emergency Use Authorization (EUA). This EUA will remain  in effect (meaning this test can be used) for the duration of the COVID-19 declaration under Section 564(b)(1) of the Act, 21 U.S.C.section 360bbb-3(b)(1), unless the authorization is terminated  or revoked sooner.       Influenza A by PCR NEGATIVE NEGATIVE Final   Influenza B by PCR NEGATIVE NEGATIVE Final    Comment: (NOTE) The Xpert Xpress SARS-CoV-2/FLU/RSV plus assay is intended as an aid in the diagnosis of influenza from Nasopharyngeal swab specimens and should not be used as a sole basis for treatment. Nasal washings and aspirates are unacceptable for Xpert Xpress SARS-CoV-2/FLU/RSV testing.  Fact Sheet for Patients: BloggerCourse.com  Fact Sheet for Healthcare Providers: SeriousBroker.it  This test is not yet approved or cleared by the [place] FDA and has been authorized for detection and/or diagnosis of SARS-CoV-2 by FDA under an Emergency Use Authorization (EUA). This EUA will remain in effect (meaning this test can be used) for the duration of the COVID-19 declaration under Section 564(b)(1) of the Act, 21 U.S.C. section 360bbb-3(b)(1), unless the authorization is terminated or revoked.  Performed at [facility], [address] [place], [place] [address]   Culture, blood (Routine X 2) w Reflex to ID Panel     Status: None (Preliminary result)   Collection Time: 02/20/21 10:37 AM   Specimen: BLOOD RIGHT HAND  Result Value  Ref Range Status   Specimen Description BLOOD RIGHT HAND  Final   Special Requests   Final    BOTTLES DRAWN AEROBIC ONLY Blood Culture results may not be optimal due to an inadequate volume of blood received in culture bottles   Culture   Final    NO GROWTH 2 DAYS Performed at [facility], [address] [place], [place] [address]    Report Status PENDING  Incomplete  Culture, blood (Routine X 2) w Reflex to ID Panel     Status: None (Preliminary result)  Collection Time: 02/20/21 10:45 AM   Specimen: BLOOD  Result Value Ref Range Status   Specimen Description BLOOD RIGHT ANTECUBITAL  Final   Special Requests   Final    BOTTLES DRAWN AEROBIC ONLY Blood Culture results may not be optimal due to an inadequate volume of blood received in culture bottles   Culture   Final    NO GROWTH 2 DAYS Performed at [facility], [address], [place], [place] [address]    Report Status PENDING  Incomplete     Medications:    acetaminophen  1,000 mg Oral Q6H   enoxaparin (LOVENOX) injection  40 mg Subcutaneous Q24H   melatonin  3 mg Oral QHS   sodium chloride flush  3 mL Intravenous Q12H   Continuous Infusions:  sodium chloride     methocarbamol (ROBAXIN) IV     piperacillin-tazobactam (ZOSYN)  IV 3.375 g (02/22/21 0542)      LOS: 5 days   Charlynne Cousins  [facility]  02/22/2021, 8:53 AM

## 2021-02-22 NOTE — Progress Notes (Signed)
PROGRESS NOTE FOR [facility]  Subjective: She complains about right sided pain.  Objective: Vital signs in last 24 hours: Temp:  [97.5 F (36.4 C)-97.8 F (36.6 C)] 97.5 F (36.4 C) (12/02 2027) Pulse Rate:  [62-68] 65 (12/02 2027) Resp:  [16-17] 17 (12/02 2027) BP: (90-157)/(49-143) 90/49 (12/02 2027) SpO2:  [81 %-97 %] 96 % (12/02 2027) Last BM Date: 02/21/21  Intake/Output from previous day: 12/02 0701 - 12/03 0700 In: 822.6 [P.O.:600; IV Piggyback:222.6] Out: 20 [Drains:20] Intake/Output this shift: No intake/output data recorded.  General appearance: alert and uncomfortable GI: tender in the right side of the abdomen  Lab Results: Recent Labs    02/20/21 0149 02/21/21 0750 02/22/21 0054  WBC 11.3* 14.4* 10.5  HGB 11.6* 12.3 10.7*  HCT 37.1 38.7 33.4*  PLT 278 381 410*   BMET Recent Labs    02/19/21 2015 02/21/21 0750 02/22/21 0054  NA 134* 139 137  K 3.9 4.3 3.5  CL 102 105 104  CO2 22 24 25   GLUCOSE 152* 121* 122*  BUN 7* 11 12  CREATININE 0.77 0.83 0.84  CALCIUM 8.8* 9.5 9.1   LFT Recent Labs    02/22/21 0054  PROT 6.0*  ALBUMIN 2.5*  AST 30  ALT 47*  ALKPHOS 170*  BILITOT 0.4   PT/INR No results for input(s): LABPROT, INR in the last 72 hours. Hepatitis Panel No results for input(s): HEPBSAG, HCVAB, HEPAIGM, HEPBIGM in the last 72 hours. C-Diff No results for input(s): CDIFFTOX in the last 72 hours. Fecal Lactopherrin No results for input(s): FECLLACTOFRN in the last 72 hours.  Studies/Results: DG ERCP  Result Date: 02/20/2021 CLINICAL DATA:  ERCP EXAM: ERCP TECHNIQUE: Multiple spot images obtained with the fluoroscopic device and submitted for interpretation post-procedure. FLUOROSCOPY TIME:  Refer to procedure report COMPARISON:  None. FINDINGS: A total of 13 fluoroscopic spot films are submitted for review taken during ERCP. Initial images demonstrate a scope overlying the right upper quadrant. Wire catheterization of the common  bile duct is performed, followed by contrast injection. Cholangiography demonstrates a dilated common bile duct, with no obvious filling defects identified. Balloon sweeps of the common bile duct is then performed. Final images demonstrate normal intrahepatic biliary tree with persistently dilated common bile duct and no filling defects identified. IMPRESSION: Fluoroscopic images taken during ERCP as described. These images were submitted for radiologic interpretation only. Please see the procedural report for the amount of contrast and the fluoroscopy time utilized. Electronically Signed   By: 14/03/2020 M.D.   On: 02/20/2021 15:07    Medications: Scheduled:  acetaminophen  1,000 mg Oral Q6H   enoxaparin (LOVENOX) injection  40 mg Subcutaneous Q24H   melatonin  3 mg Oral QHS   sodium chloride flush  3 mL Intravenous Q12H   Continuous:  sodium chloride     methocarbamol (ROBAXIN) IV     piperacillin-tazobactam (ZOSYN)  IV 3.375 g (02/22/21 0542)    Assessment/Plan: 1) Cystic duct leak. 2) S/p lap chole.   The patient has more pain in the right side of the abdomen.  The JP drain is bloody and her daughter does not report that it was emptied since yesterday.  The average drainage is around 20-25 ml.  Afebrile.  Plan: 1) Continue to follow JP drainage. 2) If pain worsens repeat imaging will be required.  LOS: 5 days   Sani Loiseau D 02/22/2021, 7:37 AM

## 2021-02-22 NOTE — Care Plan (Signed)
Main caregiver- Pts Daughter [name] has been up for several days without sleep.  Attempted to observe pt with constant rounding, reoriented, redirecting, and diversional activities.  All attempts were unsuccessful for this very active patient.  Pt was constantly attempting to climb out of bed every 5 minutes and it presented a significant safety risk.  New order received from Dr Joana Reamer for a 1:1 for safety.

## 2021-02-23 ENCOUNTER — Encounter (HOSPITAL_COMMUNITY): Payer: Self-pay | Admitting: Internal Medicine

## 2021-02-23 DIAGNOSIS — R1011 Right upper quadrant pain: Secondary | ICD-10-CM | POA: Diagnosis not present

## 2021-02-23 DIAGNOSIS — K831 Obstruction of bile duct: Secondary | ICD-10-CM | POA: Diagnosis not present

## 2021-02-23 DIAGNOSIS — K810 Acute cholecystitis: Secondary | ICD-10-CM | POA: Diagnosis not present

## 2021-02-23 DIAGNOSIS — F03918 Unspecified dementia, unspecified severity, with other behavioral disturbance: Secondary | ICD-10-CM | POA: Diagnosis not present

## 2021-02-23 NOTE — Progress Notes (Signed)
3 Days Post-Op   Subjective/Chief Complaint: Daughter at bedside.  Patient much more comfortable today.  Less combative.  Unable to get CT scan due to mental status.   Objective: Vital signs in last 24 hours: Temp:  [97.8 F (36.6 C)-98.2 F (36.8 C)] 98.1 F (36.7 C) (12/04 0610) Pulse Rate:  [83-92] 92 (12/04 0610) Resp:  [17-20] 17 (12/04 0610) BP: (113-137)/(62-86) 113/86 (12/04 0610) SpO2:  [95 %-99 %] 95 % (12/04 0610) Last BM Date: 02/21/21  Intake/Output from previous day: 12/03 0701 - 12/04 0700 In: 960 [P.O.:960] Out: 1105 [Urine:1105] Intake/Output this shift: No intake/output data recorded.   Gen:  Alert, NAD, pleasant Pulm:  Normal effort ORA Abd: Soft, plus tender without peritonitis .  incisions c/d/I, JP with sanguinous drainage-amount not recorded-appears to have about 25 cc in the bulb Skin: warm and dry, no rashes  Psych: A&Ox1 Lab Results:  Recent Labs    02/21/21 0750 02/22/21 0054  WBC 14.4* 10.5  HGB 12.3 10.7*  HCT 38.7 33.4*  PLT 381 410*   BMET Recent Labs    02/21/21 0750 02/22/21 0054  NA 139 137  K 4.3 3.5  CL 105 104  CO2 24 25  GLUCOSE 121* 122*  BUN 11 12  CREATININE 0.83 0.84  CALCIUM 9.5 9.1   PT/INR No results for input(s): LABPROT, INR in the last 72 hours. ABG No results for input(s): PHART, HCO3 in the last 72 hours.  Invalid input(s): PCO2, PO2  Studies/Results: No results found.  Anti-infectives: Anti-infectives (From admission, onward)    Start     Dose/Rate Route Frequency Ordered Stop   02/17/21 1400  piperacillin-tazobactam (ZOSYN) IVPB 3.375 g        3.375 g 12.5 mL/hr over 240 Minutes Intravenous Every 8 hours 02/17/21 1028     02/16/21 1715  piperacillin-tazobactam (ZOSYN) IVPB 3.375 g        3.375 g 100 mL/hr over 30 Minutes Intravenous  Once 02/16/21 1701 02/16/21 2217       Assessment/Plan:  calculous cholecystitis with concern for choledocholithiasis -lap chole 11/29 Dr. Sheliah Hatch-  "IOC showed dilated bile duct with concern for round structure at the ampulla and no flow of the contrast into the duodenum" - ERCP 12/1 w/out choledocholithiasis but with possible cystic stump leak - patient without obvious bile in JP drain today.  She is having LESS  diffuse abdominal pain and  abdomen soft CT not done due to dementia and pt confused when daughter leaves She appears more comfortable today Hopefully get CT if she cooperates but no bile in her drain which is encouraging  Discussion with daughter about disposition.  The daughter needs to go back to work which I think is fine.  She may require skilled nursing at discharge depending on what the family can provide versus with the patient is able to do.  She seems better today and abdomen is much less tender today. - HH diet and monitor bowel function  - CBC and CMP in AM   LOS: 6 days    Dortha Schwalbe MD 02/23/2021

## 2021-02-23 NOTE — Progress Notes (Signed)
Subjective: Her daughter reports that her pain is improving.  Objective: Vital signs in last 24 hours: Temp:  [97.8 F (36.6 C)-98.2 F (36.8 C)] 98.1 F (36.7 C) (12/04 0610) Pulse Rate:  [83-92] 92 (12/04 0610) Resp:  [17-20] 17 (12/04 0610) BP: (113-137)/(62-86) 113/86 (12/04 0610) SpO2:  [95 %-99 %] 95 % (12/04 0610) Last BM Date: 02/21/21  Intake/Output from previous day: 12/03 0701 - 12/04 0700 In: 960 [P.O.:960] Out: 1105 [Urine:1105] Intake/Output this shift: No intake/output data recorded.  General appearance: sleeping soundly GI: JP drain in the RUQ  Lab Results: Recent Labs    02/21/21 0750 02/22/21 0054  WBC 14.4* 10.5  HGB 12.3 10.7*  HCT 38.7 33.4*  PLT 381 410*   BMET Recent Labs    02/21/21 0750 02/22/21 0054  NA 139 137  K 4.3 3.5  CL 105 104  CO2 24 25  GLUCOSE 121* 122*  BUN 11 12  CREATININE 0.83 0.84  CALCIUM 9.5 9.1   LFT Recent Labs    02/22/21 0054  PROT 6.0*  ALBUMIN 2.5*  AST 30  ALT 47*  ALKPHOS 170*  BILITOT 0.4   PT/INR No results for input(s): LABPROT, INR in the last 72 hours. Hepatitis Panel No results for input(s): HEPBSAG, HCVAB, HEPAIGM, HEPBIGM in the last 72 hours. C-Diff No results for input(s): CDIFFTOX in the last 72 hours. Fecal Lactopherrin No results for input(s): FECLLACTOFRN in the last 72 hours.  Studies/Results: No results found.  Medications: Scheduled:  acetaminophen  1,000 mg Oral Q6H   enoxaparin (LOVENOX) injection  40 mg Subcutaneous Q24H   melatonin  3 mg Oral QHS   sodium chloride flush  3 mL Intravenous Q12H   Continuous:  sodium chloride     methocarbamol (ROBAXIN) IV     piperacillin-tazobactam (ZOSYN)  IV 3.375 g (02/23/21 6761)    Assessment/Plan: 1) Cystic duct leak. 2) Anemia. 3) ? Melena.   The patient's JP drain only shows 20 ml in the bulb.  This is consistent with what was reported in the chart.  It is less bloody.  Her daughter reports that she has some  dark/black colored stools and it was diarrhea.  Her HGB was noted to be at 10.7 g/dL , which is a drop from 12.3 g/dL.  Plan: 1) Continue to monitor JP drain. 2) Follow HGB and transfuse if necessary. 3) If bleeding worsens an EGD may be required.  LOS: 6 days   Dunia Pringle D 02/23/2021, 7:54 AM

## 2021-02-23 NOTE — Progress Notes (Addendum)
VAST consult placed for PIV access. Jullianna Gabor RN and Westley Gambles RN attempted 4 time total but were unsuccessful.Fragile vascular access was assessed.The patient is very confused and combative at this time. The bedside RN and tech had to hold the patient down during PIV attempts. This RN would recommend an USGIV or midline. Pt is getting IV zosyn and robaxin

## 2021-02-23 NOTE — Progress Notes (Addendum)
[facility] PROGRESS NOTE    Progress Note  Ebony Pierce  [id number] DOB: 09-24-1935 DOA: 02/16/2021 PCP: Gifford Shave, MD     Brief Narrative:   Ebony Pierce is an 85 y.o. female past medical history significant of dementia, vitamin D B12 deficiency who comes into the ED for right upper quadrant pain was seen in the ED on 02/13/2021 with similar complaints labs were unremarkable.  She returned to the ED with the same complaint right upper quadrant ultrasound showed normal gallbladder irregular thickening of the wall and edema with up to 9 mm associated with pericolic cystic fluid positive Murphy sign surgery was consulted she is status post surgical intervention on 02/18/2021 with inflamed gangrenous gallbladder bile duct there was a concern for round stricture at the ampulla and no flow of contrast into the duodenum.    Assessment/Plan: GI on   RUQ pain in the setting of transaminitis concern for cholecystitis: Status post lap chole on 02/18/2021  GI recommended ERCP that showed large periampullary diverticulum status post sphincterotomy, no gallstone extracted.  Continue Zosyn. Continue to follow JP drain and hemoglobin transfuse if necessary.  General surgery recommended CT scan of the abdomen pelvis, the patient appears more comfortable today. Continue current diet and management per surgery.  Fever and leukocytosis:  Afebrile overnight. Continue IV Zosyn.  Culture data negative date. Antibiotic duration per general surgery  Hypokalemia: Replete orally 3.7 today.  Dementia with behavioral disturbances: At high risk of aspiration and delirium monitor closely.  Vitamin B12 deficiency: Checked this year it was stable.   DVT prophylaxis: lovenox Family Communication:none Status is: Inpatient  Remains inpatient appropriate because: Acute cholecystitis which will not require ERCP.      Code Status:     Code Status Orders  (From admission,  onward)           Start     Ordered   02/16/21 1806  Full code  Continuous        02/16/21 1809           Code Status History     This patient has a current code status but no historical code status.         IV Access:   Peripheral IV   Procedures and diagnostic studies:   No results found.   Medical Consultants:   None.   Subjective:    Ebony Pierce tolerating her diet. Objective:    Vitals:   02/22/21 1450 02/22/21 2013 02/23/21 0610 02/23/21 0830  BP: 117/65 137/62 113/86 (!) 144/84  Pulse: 84 83 92 85  Resp: 20 18 17 16   Temp: 97.8 F (36.6 C) 98.2 F (36.8 C) 98.1 F (36.7 C) 97.7 F (36.5 C)  TempSrc: Oral Oral  Oral  SpO2: 99% 98% 95% 98%  Height:       SpO2: 98 % O2 Flow Rate (L/min): 2 L/min   Intake/Output Summary (Last 24 hours) at 02/23/2021 0918 Last data filed at 02/23/2021 [id number] Gross per 24 hour  Intake 720 ml  Output 605 ml  Net 115 ml    There were no vitals filed for this visit.  Exam: General exam: In no acute distress. Respiratory system: Good air movement and clear to auscultation. Cardiovascular system: S1 & S2 heard, RRR. No JVD. Gastrointestinal system: Abdomen is nondistended, soft and nontender.  Extremities: No pedal edema. Skin: No rashes, lesions or ulcers  Data Reviewed:    Labs: Basic Metabolic Panel: Recent Labs  Lab 02/18/21 0116 02/19/21  [id number] 02/19/21 2015 02/21/21 0750 02/22/21 0054  NA 140 136 134* 139 137  K 3.4* 3.7 3.9 4.3 3.5  CL 105 106 102 105 104  CO2 25 20* 22 24 25   GLUCOSE 106* 120* 152* 121* 122*  BUN 13 11 7* 11 12  CREATININE 0.89 0.75 0.77 0.83 0.84  CALCIUM 9.4 8.8* 8.8* 9.5 9.1    GFR Estimated Creatinine Clearance: 43.5 mL/min (by C-G formula based on SCr of 0.84 mg/dL). Liver Function Tests: Recent Labs  Lab 02/18/21 0116 02/19/21 0328 02/19/21 2015 02/21/21 0750 02/22/21 0054  AST 41 95* 69* 49* 30  ALT 67* 77* 72* 67* 47*  ALKPHOS 177* 134*  131* 232* 170*  BILITOT 1.0 1.0 0.8 0.9 0.4  PROT 6.5 5.6* 5.9* 6.5 6.0*  ALBUMIN 2.9* 2.7* 2.7* 2.7* 2.5*    Recent Labs  Lab 02/16/21 1328 02/22/21 0054  LIPASE 31 27    No results for input(s): AMMONIA in the last 168 hours. Coagulation profile Recent Labs  Lab 02/16/21 2044  INR 1.1    COVID-19 Labs  No results for input(s): DDIMER, FERRITIN, LDH, CRP in the last 72 hours.  Lab Results  Component Value Date   [place] NEGATIVE 02/16/2021    CBC: Recent Labs  Lab 02/16/21 1328 02/17/21 0344 02/18/21 0116 02/18/21 1823 02/19/21 0328 02/20/21 0149 02/21/21 0750 02/22/21 0054  WBC 12.3*   < > 5.8  --  10.5 11.3* 14.4* 10.5  NEUTROABS 9.8*  --   --   --   --  8.7*  --   --   HGB 14.6   < > 13.0 12.9 12.3 11.6* 12.3 10.7*  HCT 45.0   < > 40.0 41.1 38.8 37.1 38.7 33.4*  MCV 90.4   < > 88.9  --  92.4 90.7 90.0 89.5  PLT 279   < > 334  --  275 278 381 410*   < > = values in this interval not displayed.    Cardiac Enzymes: No results for input(s): CKTOTAL, CKMB, CKMBINDEX, TROPONINI in the last 168 hours. BNP (last 3 results) No results for input(s): PROBNP in the last 8760 hours. CBG: No results for input(s): GLUCAP in the last 168 hours. D-Dimer: No results for input(s): DDIMER in the last 72 hours. Hgb A1c: No results for input(s): HGBA1C in the last 72 hours. Lipid Profile: No results for input(s): CHOL, HDL, LDLCALC, TRIG, CHOLHDL, LDLDIRECT in the last 72 hours. Thyroid function studies: No results for input(s): TSH, T4TOTAL, T3FREE, THYROIDAB in the last 72 hours.  Invalid input(s): FREET3 Anemia work up: No results for input(s): VITAMINB12, FOLATE, FERRITIN, TIBC, IRON, RETICCTPCT in the last 72 hours. Sepsis Labs: Recent Labs  Lab 02/19/21 0328 02/20/21 0149 02/21/21 0750 02/22/21 0054  WBC 10.5 11.3* 14.4* 10.5    Microbiology Recent Results (from the past 240 hour(s))  Resp Panel by RT-PCR (Flu A&B, Covid) Nasopharyngeal Swab      Status: None   Collection Time: 02/16/21 10:38 PM   Specimen: Nasopharyngeal Swab; Nasopharyngeal(NP) swabs in vial transport medium  Result Value Ref Range Status   SARS Coronavirus 2 by RT PCR NEGATIVE NEGATIVE Final    Comment: (NOTE) SARS-CoV-2 target nucleic acids are NOT DETECTED.  The SARS-CoV-2 RNA is generally detectable in upper respiratory specimens during the acute phase of infection. The lowest concentration of SARS-CoV-2 viral copies this assay can detect is 138 copies/mL. A negative result does not preclude SARS-Cov-2 infection and should not be used as  the sole basis for treatment or other patient management decisions. A negative result may occur with  improper specimen collection/handling, submission of specimen other than nasopharyngeal swab, presence of viral mutation(s) within the areas targeted by this assay, and inadequate number of viral copies(<138 copies/mL). A negative result must be combined with clinical observations, patient history, and epidemiological information. The expected result is Negative.  Fact Sheet for Patients:  BloggerCourse.comhttps://www.fda.gov/media/152166/download  Fact Sheet for Healthcare Providers:  SeriousBroker.ithttps://www.fda.gov/media/152162/download  This test is no t yet approved or cleared by the [place] FDA and  has been authorized for detection and/or diagnosis of SARS-CoV-2 by FDA under an Emergency Use Authorization (EUA). This EUA will remain  in effect (meaning this test can be used) for the duration of the COVID-19 declaration under Section 564(b)(1) of the Act, 21 U.S.C.section 360bbb-3(b)(1), unless the authorization is terminated  or revoked sooner.       Influenza A by PCR NEGATIVE NEGATIVE Final   Influenza B by PCR NEGATIVE NEGATIVE Final    Comment: (NOTE) The Xpert Xpress SARS-CoV-2/FLU/RSV plus assay is intended as an aid in the diagnosis of influenza from Nasopharyngeal swab specimens and should not be used as a sole basis  for treatment. Nasal washings and aspirates are unacceptable for Xpert Xpress SARS-CoV-2/FLU/RSV testing.  Fact Sheet for Patients: BloggerCourse.comhttps://www.fda.gov/media/152166/download  Fact Sheet for Healthcare Providers: SeriousBroker.ithttps://www.fda.gov/media/152162/download  This test is not yet approved or cleared by the [place] FDA and has been authorized for detection and/or diagnosis of SARS-CoV-2 by FDA under an Emergency Use Authorization (EUA). This EUA will remain in effect (meaning this test can be used) for the duration of the COVID-19 declaration under Section 564(b)(1) of the Act, 21 U.S.C. section 360bbb-3(b)(1), unless the authorization is terminated or revoked.  Performed at [facility], [address] [place], [place] [address]   Culture, blood (Routine X 2) w Reflex to ID Panel     Status: None (Preliminary result)   Collection Time: 02/20/21 10:37 AM   Specimen: BLOOD RIGHT HAND  Result Value Ref Range Status   Specimen Description BLOOD RIGHT HAND  Final   Special Requests   Final    BOTTLES DRAWN AEROBIC ONLY Blood Culture results may not be optimal due to an inadequate volume of blood received in culture bottles   Culture   Final    NO GROWTH 3 DAYS Performed at [facility], [address] [place], [place] [address]    Report Status PENDING  Incomplete  Culture, blood (Routine X 2) w Reflex to ID Panel     Status: None (Preliminary result)   Collection Time: 02/20/21 10:45 AM   Specimen: BLOOD  Result Value Ref Range Status   Specimen Description BLOOD RIGHT ANTECUBITAL  Final   Special Requests   Final    BOTTLES DRAWN AEROBIC ONLY Blood Culture results may not be optimal due to an inadequate volume of blood received in culture bottles   Culture   Final    NO GROWTH 3 DAYS Performed at [facility], [address] [place], [place] [address]    Report Status PENDING  Incomplete     Medications:    acetaminophen  1,000 mg Oral Q6H    enoxaparin (LOVENOX) injection  40 mg Subcutaneous Q24H   melatonin  3 mg Oral QHS   sodium chloride flush  3 mL Intravenous Q12H   Continuous Infusions:  sodium chloride     methocarbamol (ROBAXIN) IV  piperacillin-tazobactam (ZOSYN)  IV 3.375 g (02/23/21 3086)      LOS: 6 days   Marinda Elk  [facility]  02/23/2021, 9:18 AM

## 2021-02-24 DIAGNOSIS — F03918 Unspecified dementia, unspecified severity, with other behavioral disturbance: Secondary | ICD-10-CM | POA: Diagnosis not present

## 2021-02-24 DIAGNOSIS — R1011 Right upper quadrant pain: Secondary | ICD-10-CM | POA: Diagnosis not present

## 2021-02-24 DIAGNOSIS — R7989 Other specified abnormal findings of blood chemistry: Secondary | ICD-10-CM | POA: Diagnosis not present

## 2021-02-24 DIAGNOSIS — R933 Abnormal findings on diagnostic imaging of other parts of digestive tract: Secondary | ICD-10-CM

## 2021-02-24 DIAGNOSIS — K810 Acute cholecystitis: Secondary | ICD-10-CM | POA: Diagnosis not present

## 2021-02-24 DIAGNOSIS — K831 Obstruction of bile duct: Secondary | ICD-10-CM | POA: Diagnosis not present

## 2021-02-24 DIAGNOSIS — G8929 Other chronic pain: Secondary | ICD-10-CM

## 2021-02-24 LAB — CBC
HCT: 31.5 % — ABNORMAL LOW (ref 36.0–46.0)
Hemoglobin: 10 g/dL — ABNORMAL LOW (ref 12.0–15.0)
MCH: 28 pg (ref 26.0–34.0)
MCHC: 31.7 g/dL (ref 30.0–36.0)
MCV: 88.2 fL (ref 80.0–100.0)
Platelets: 549 10*3/uL — ABNORMAL HIGH (ref 150–400)
RBC: 3.57 MIL/uL — ABNORMAL LOW (ref 3.87–5.11)
RDW: 13.6 % (ref 11.5–15.5)
WBC: 13.6 10*3/uL — ABNORMAL HIGH (ref 4.0–10.5)
nRBC: 0 % (ref 0.0–0.2)

## 2021-02-24 LAB — COMPREHENSIVE METABOLIC PANEL
ALT: 40 U/L (ref 0–44)
AST: 37 U/L (ref 15–41)
Albumin: 2.4 g/dL — ABNORMAL LOW (ref 3.5–5.0)
Alkaline Phosphatase: 149 U/L — ABNORMAL HIGH (ref 38–126)
Anion gap: 8 (ref 5–15)
BUN: 7 mg/dL — ABNORMAL LOW (ref 8–23)
CO2: 25 mmol/L (ref 22–32)
Calcium: 9.1 mg/dL (ref 8.9–10.3)
Chloride: 103 mmol/L (ref 98–111)
Creatinine, Ser: 0.92 mg/dL (ref 0.44–1.00)
GFR, Estimated: >60 mL/min (ref >60)
Glucose, Bld: 120 mg/dL — ABNORMAL HIGH (ref 70–99)
Potassium: 3.1 mmol/L — ABNORMAL LOW (ref 3.5–5.1)
Sodium: 136 mmol/L (ref 135–145)
Total Bilirubin: 0.5 mg/dL (ref 0.3–1.2)
Total Protein: 6 g/dL — ABNORMAL LOW (ref 6.5–8.1)

## 2021-02-24 NOTE — Progress Notes (Signed)
4 Days Post-Op   Subjective/Chief Complaint: Patient confused and won't even speak or interact with the interpreter    Objective: Vital signs in last 24 hours: Temp:  [98.4 F (36.9 C)-100 F (37.8 C)] 99.3 F (37.4 C) (12/05 0722) Pulse Rate:  [84-86] 86 (12/05 0722) Resp:  [16-18] 18 (12/05 0722) BP: (112-142)/(52-79) 142/79 (12/05 0722) SpO2:  [98 %] 98 % (12/05 0722) Weight:  [65.5 kg] 65.5 kg (12/04 1711) Last BM Date: 02/21/21  Intake/Output from previous day: 12/04 0701 - 12/05 0700 In: 480 [P.O.:480] Out: -  Intake/Output this shift: Total I/O In: 240 [P.O.:240] Out: -    Gen:  Alert, confused and pulling at things.  Mittens in place Abd: Soft, seems tender in RUQ, but can't tell if this better or worse.  JP drain with minimal old bloody drainage.  No bile.  Incisions c/d/i  Lab Results:  Recent Labs    02/22/21 0054  WBC 10.5  HGB 10.7*  HCT 33.4*  PLT 410*   BMET Recent Labs    02/22/21 0054  NA 137  K 3.5  CL 104  CO2 25  GLUCOSE 122*  BUN 12  CREATININE 0.84  CALCIUM 9.1   PT/INR No results for input(s): LABPROT, INR in the last 72 hours. ABG No results for input(s): PHART, HCO3 in the last 72 hours.  Invalid input(s): PCO2, PO2  Studies/Results: No results found.  Anti-infectives: Anti-infectives (From admission, onward)    Start     Dose/Rate Route Frequency Ordered Stop   02/17/21 1400  piperacillin-tazobactam (ZOSYN) IVPB 3.375 g        3.375 g 12.5 mL/hr over 240 Minutes Intravenous Every 8 hours 02/17/21 1028     02/16/21 1715  piperacillin-tazobactam (ZOSYN) IVPB 3.375 g        3.375 g 100 mL/hr over 30 Minutes Intravenous  Once 02/16/21 1701 02/16/21 2217       Assessment/Plan: POD 6, s/p lap chole for calculous cholecystitis with concern for choledocholithiasis by Dr. Sheliah Hatch on 11/29 - ERCP 12/1 w/out choledocholithiasis but with possible cystic stump leak - patient without obvious bile in JP drain today.   -CT  scan pending, but unable to do thus far due to agitation and dementia.  Unclear if she needs this -will check labs today to see what WBC is doing as well as LFTs. -daughter not present today and patient can't even communicate with interpreter.  FEN - HH diet VTE - Lovenox ID - zosyn, unclear how many days needed, on D6 at least  LOS: 7 days    Letha Cape MD 02/24/2021

## 2021-02-24 NOTE — Progress Notes (Addendum)
[facility] Progress Note  Chief Complaint: Right upper quadrant pain and bile leak  History:  Unable to obtain history, family not present at bedside.  Patient awakened easily, then was agitated. Signout received from Dr. Elnoria Howard, chart review performed. Most notably, ERCP report and subsequent notes reviewed indicating that ERCP images showed a cystic duct stump leak. There is no output recorded from this JP drain over the last 48 hours (in the 48 hours prior to that it was 45 cc since the ERCP). ROS: Unable to obtain additional review of systems  Objective:   Current Facility-Administered Medications:    0.9 %  sodium chloride infusion, 250 mL, Intravenous, PRN, Hilarie Fredrickson, MD   acetaminophen (TYLENOL) tablet 1,000 mg, 1,000 mg, Oral, Q6H, Hilarie Fredrickson, MD, 1,000 mg at 02/24/21 0754   enoxaparin (LOVENOX) injection 40 mg, 40 mg, Subcutaneous, Q24H, Hilarie Fredrickson, MD, 40 mg at 02/24/21 0754   fentaNYL (SUBLIMAZE) injection 12.5-25 mcg, 12.5-25 mcg, Intravenous, Q2H PRN, Hilarie Fredrickson, MD, 25 mcg at 02/21/21 1658   haloperidol lactate (HALDOL) injection 2 mg, 2 mg, Intravenous, Q6H PRN, Marinda Elk, MD, 2 mg at 02/24/21 1141   lip balm (CARMEX) ointment, , Topical, PRN, Hilarie Fredrickson, MD   melatonin tablet 3 mg, 3 mg, Oral, QHS, Hilarie Fredrickson, MD, 3 mg at 02/23/21 2206   methocarbamol (ROBAXIN) 500 mg in dextrose 5 % 50 mL IVPB, 500 mg, Intravenous, Q6H PRN, Hilarie Fredrickson, MD   ondansetron [facility]) tablet 4 mg, 4 mg, Oral, Q6H PRN **OR** ondansetron (ZOFRAN) injection 4 mg, 4 mg, Intravenous, Q6H PRN, Hilarie Fredrickson, MD   oxyCODONE (Oxy IR/ROXICODONE) immediate release tablet 5 mg, 5 mg, Oral, Q4H PRN, Hilarie Fredrickson, MD, 5 mg at 02/24/21 0657   piperacillin-tazobactam (ZOSYN) IVPB 3.375 g, 3.375 g, Intravenous, Q8H, Hilarie Fredrickson, MD, Last Rate: 12.5 mL/hr at 02/24/21 0601, 3.375 g at 02/24/21 0601   QUEtiapine (SEROQUEL) tablet 50 mg, 50 mg, Oral, QHS PRN, Marinda Elk,  MD, 50 mg at 02/23/21 1719   sodium chloride flush (NS) 0.9 % injection 3 mL, 3 mL, Intravenous, Q12H, Hilarie Fredrickson, MD, 3 mL at 02/24/21 5284   sodium chloride flush (NS) 0.9 % injection 3 mL, 3 mL, Intravenous, PRN, Hilarie Fredrickson, MD   sodium chloride     methocarbamol (ROBAXIN) IV     piperacillin-tazobactam (ZOSYN)  IV 3.375 g (02/24/21 0601)     Vital signs in last 24 hrs: Vitals:   02/23/21 1940 02/24/21 0722  BP: 134/74 (!) 142/79  Pulse: 84 86  Resp: 17 18  Temp: 100 F (37.8 C) 99.3 F (37.4 C)  SpO2: 98% 98%    Intake/Output Summary (Last 24 hours) at 02/24/2021 1148 Last data filed at 02/24/2021 0715 Gross per 24 hour  Intake 480 ml  Output --  Net 480 ml     Physical Exam Acutely ill, nontoxic HEENT: sclera anicteric, oral mucosa without lesions Neck: supple, no thyromegaly, JVD or lymphadenopathy Cardiac: RRR without murmurs, S1S2 heard, no peripheral edema Pulm: clear to auscultation bilaterally, normal RR and effort noted Abdomen: soft, RUQ tenderness, with active bowel sounds.  JP drain with serosanguineous fluid Skin; warm and dry, no jaundice  Recent Labs:  CBC Latest Ref Rng & Units 02/22/2021 02/21/2021 02/20/2021  WBC 4.0 - 10.5 K/uL 10.5 14.4(H) 11.3(H)  Hemoglobin 12.0 - 15.0 g/dL 10.7(L) 12.3 11.6(L)  Hematocrit 36.0 - 46.0 % 33.4(L) 38.7 37.1  Platelets 150 - 400 K/uL 410(H) 381 278    No results for input(s): INR in the last 168 hours. CMP Latest Ref Rng & Units 02/22/2021 02/21/2021 02/19/2021  Glucose 70 - 99 mg/dL 122(H) 121(H) 152(H)  BUN 8 - 23 mg/dL 12 11 7(L)  Creatinine 0.44 - 1.00 mg/dL 0.84 0.83 0.77  Sodium 135 - 145 mmol/L 137 139 134(L)  Potassium 3.5 - 5.1 mmol/L 3.5 4.3 3.9  Chloride 98 - 111 mmol/L 104 105 102  CO2 22 - 32 mmol/L 25 24 22   Calcium 8.9 - 10.3 mg/dL 9.1 9.5 8.8(L)  Total Protein 6.5 - 8.1 g/dL 6.0(L) 6.5 5.9(L)  Total Bilirubin 0.3 - 1.2 mg/dL 0.4 0.9 0.8  Alkaline Phos 38 - 126 U/L 170(H) 232(H) 131(H)   AST 15 - 41 U/L 30 49(H) 69(H)  ALT 0 - 44 U/L 47(H) 67(H) 72(H)     Radiologic studies:  None new since yesterday Assessment & Plan  Assessment:  - Abnormal imaging GI tract, dilated biliary tree on cholangiogram with suspicion for distal stricture/obstruction or stone.  ERCP with no obstruction or stone, sphincterotomy performed.  Cystic duct stump leak noted on imaging, though there is no bile in this drain output which appears to have stopped since the ERCP.   -Right upper quadrant pain  -Acute cholecystitis  -Elevated LFTs  Patient will be expected to have ongoing pain due to the severity of cholecystitis and JP drain in place.  Of note, patient has not had labs in 48 hours, and review of surgical note indicates there plans to check a CBC and hepatic function panel. Patient is on antibiotics for cholecystitis/gangrenous gallbladder.  If patient begins having bilious drainage with increased output of JP drain or LFT significantly rising, please contact our service.  Otherwise, signing off today and call us as needed.  Nelida Meuse III Office: [phone]

## 2021-02-24 NOTE — Progress Notes (Signed)
[facility] PROGRESS NOTE    Progress Note  Ebony Pierce  [id number] DOB: [birth year] DOA: 02/16/2021 PCP: Gifford Shave, MD     Brief Narrative:   Ebony Pierce is an 85 y.o. female past medical history significant of dementia, vitamin D B12 deficiency who comes into the ED for right upper quadrant pain was seen in the ED on 02/13/2021 with similar complaints labs were unremarkable.  She returned to the ED with the same complaint right upper quadrant ultrasound showed normal gallbladder irregular thickening of the wall and edema with up to 9 mm associated with pericolic cystic fluid positive Murphy sign surgery was consulted she is status post surgical intervention on 02/18/2021 with inflamed gangrenous gallbladder bile duct there was a concern for round stricture at the ampulla and no flow of contrast into the duodenum.  ERCP was done on 02/20/2021 that showed large periampullary diverticulum, no gallstones sphincterotomy was performed    Assessment/Plan: GI on   RUQ pain in the setting of transaminitis concern for cholecystitis: Continue Zosyn per Surgery Continue to follow JP drain . Follow Hbg transfuse if necessary.   Further management per General surgery recommended Continue current diet and management per surgery.  Fever and leukocytosis:  T-max of 100 Continue IV Zosyn.  Culture data negative date. Antibiotic duration per general surgery  Hypokalemia: Check a basic metabolic panel.  Dementia with behavioral disturbances: At high risk of aspiration and delirium monitor closely.  Vitamin B12 deficiency: Checked this year it was stable.   DVT prophylaxis: lovenox Family Communication:none Status is: Inpatient  Remains inpatient appropriate because: Acute cholecystitis which will not require ERCP.      Code Status:     Code Status Orders  (From admission, onward)           Start     Ordered   02/16/21 1806  Full code  Continuous         02/16/21 1809           Code Status History     This patient has a current code status but no historical code status.         IV Access:   Peripheral IV   Procedures and diagnostic studies:   No results found.   Medical Consultants:   None.   Subjective:    Ebony Pierce tolerating her diet. Objective:    Vitals:   02/23/21 1504 02/23/21 1711 02/23/21 1940 02/24/21 0722  BP: (!) 112/52  134/74 (!) 142/79  Pulse: 86  84 86  Resp: 16  17 18   Temp: 98.4 F (36.9 C)  100 F (37.8 C) 99.3 F (37.4 C)  TempSrc: Oral   Oral  SpO2:   98% 98%  Weight:  65.5 kg    Height:  5\' 2"  (1.575 m)     SpO2: 98 % O2 Flow Rate (L/min): 2 L/min   Intake/Output Summary (Last 24 hours) at 02/24/2021 0957 Last data filed at 02/24/2021 0715 Gross per 24 hour  Intake 480 ml  Output --  Net 480 ml    Filed Weights   02/23/21 1711  Weight: 65.5 kg    Exam: General exam: In no acute distress. Respiratory system: Good air movement and clear to auscultation. Cardiovascular system: S1 & S2 heard, RRR. No JVD. Gastrointestinal system: Abdomen is nondistended, soft and nontender.  Extremities: No pedal edema. Skin: No rashes, lesions or ulcers  Data Reviewed:    Labs: Basic Metabolic Panel: Recent Labs  Lab 02/18/21  0116 02/19/21 0328 02/19/21 2015 02/21/21 0750 02/22/21 0054  NA 140 136 134* 139 137  K 3.4* 3.7 3.9 4.3 3.5  CL 105 106 102 105 104  CO2 25 20* 22 24 25   GLUCOSE 106* 120* 152* 121* 122*  BUN 13 11 7* 11 12  CREATININE 0.89 0.75 0.77 0.83 0.84  CALCIUM 9.4 8.8* 8.8* 9.5 9.1    GFR Estimated Creatinine Clearance: 45.1 mL/min (by C-G formula based on SCr of 0.84 mg/dL). Liver Function Tests: Recent Labs  Lab 02/18/21 0116 02/19/21 0328 02/19/21 2015 02/21/21 0750 02/22/21 0054  AST 41 95* 69* 49* 30  ALT 67* 77* 72* 67* 47*  ALKPHOS 177* 134* 131* 232* 170*  BILITOT 1.0 1.0 0.8 0.9 0.4  PROT 6.5 5.6* 5.9* 6.5 6.0*   ALBUMIN 2.9* 2.7* 2.7* 2.7* 2.5*    Recent Labs  Lab 02/22/21 0054  LIPASE 27    No results for input(s): AMMONIA in the last 168 hours. Coagulation profile No results for input(s): INR, PROTIME in the last 168 hours.  COVID-19 Labs  No results for input(s): DDIMER, FERRITIN, LDH, CRP in the last 72 hours.  Lab Results  Component Value Date   SARSCOV2NAA NEGATIVE 02/16/2021    CBC: Recent Labs  Lab 02/18/21 0116 02/18/21 1823 02/19/21 0328 02/20/21 0149 02/21/21 0750 02/22/21 0054  WBC 5.8  --  10.5 11.3* 14.4* 10.5  NEUTROABS  --   --   --  8.7*  --   --   HGB 13.0 12.9 12.3 11.6* 12.3 10.7*  HCT 40.0 41.1 38.8 37.1 38.7 33.4*  MCV 88.9  --  92.4 90.7 90.0 89.5  PLT 334  --  275 278 381 410*    Cardiac Enzymes: No results for input(s): CKTOTAL, CKMB, CKMBINDEX, TROPONINI in the last 168 hours. BNP (last 3 results) No results for input(s): PROBNP in the last 8760 hours. CBG: No results for input(s): GLUCAP in the last 168 hours. D-Dimer: No results for input(s): DDIMER in the last 72 hours. Hgb A1c: No results for input(s): HGBA1C in the last 72 hours. Lipid Profile: No results for input(s): CHOL, HDL, LDLCALC, TRIG, CHOLHDL, LDLDIRECT in the last 72 hours. Thyroid function studies: No results for input(s): TSH, T4TOTAL, T3FREE, THYROIDAB in the last 72 hours.  Invalid input(s): FREET3 Anemia work up: No results for input(s): VITAMINB12, FOLATE, FERRITIN, TIBC, IRON, RETICCTPCT in the last 72 hours. Sepsis Labs: Recent Labs  Lab 02/19/21 0328 02/20/21 0149 02/21/21 0750 02/22/21 0054  WBC 10.5 11.3* 14.4* 10.5    Microbiology Recent Results (from the past 240 hour(s))  Resp Panel by RT-PCR (Flu A&B, Covid) Nasopharyngeal Swab     Status: None   Collection Time: 02/16/21 10:38 PM   Specimen: Nasopharyngeal Swab; Nasopharyngeal(NP) swabs in vial transport medium  Result Value Ref Range Status   SARS Coronavirus 2 by RT PCR NEGATIVE NEGATIVE  Final    Comment: (NOTE) SARS-CoV-2 target nucleic acids are NOT DETECTED.  The SARS-CoV-2 RNA is generally detectable in upper respiratory specimens during the acute phase of infection. The lowest concentration of SARS-CoV-2 viral copies this assay can detect is 138 copies/mL. A negative result does not preclude SARS-Cov-2 infection and should not be used as the sole basis for treatment or other patient management decisions. A negative result may occur with  improper specimen collection/handling, submission of specimen other than nasopharyngeal swab, presence of viral mutation(s) within the areas targeted by this assay, and inadequate number of viral copies(<138 copies/mL).  A negative result must be combined with clinical observations, patient history, and epidemiological information. The expected result is Negative.  Fact Sheet for Patients:  EntrepreneurPulse.com.au  Fact Sheet for Healthcare Providers:  IncredibleEmployment.be  This test is no t yet approved or cleared by the [place] FDA and  has been authorized for detection and/or diagnosis of SARS-CoV-2 by FDA under an Emergency Use Authorization (EUA). This EUA will remain  in effect (meaning this test can be used) for the duration of the COVID-19 declaration under Section 564(b)(1) of the Act, 21 U.S.C.section 360bbb-3(b)(1), unless the authorization is terminated  or revoked sooner.       Influenza A by PCR NEGATIVE NEGATIVE Final   Influenza B by PCR NEGATIVE NEGATIVE Final    Comment: (NOTE) The Xpert Xpress SARS-CoV-2/FLU/RSV plus assay is intended as an aid in the diagnosis of influenza from Nasopharyngeal swab specimens and should not be used as a sole basis for treatment. Nasal washings and aspirates are unacceptable for Xpert Xpress SARS-CoV-2/FLU/RSV testing.  Fact Sheet for Patients: EntrepreneurPulse.com.au  Fact Sheet for Healthcare  Providers: IncredibleEmployment.be  This test is not yet approved or cleared by the [place] FDA and has been authorized for detection and/or diagnosis of SARS-CoV-2 by FDA under an Emergency Use Authorization (EUA). This EUA will remain in effect (meaning this test can be used) for the duration of the COVID-19 declaration under Section 564(b)(1) of the Act, 21 U.S.C. section 360bbb-3(b)(1), unless the authorization is terminated or revoked.  Performed at [facility], [place] [address], [place], [place] [address]   Culture, blood (Routine X 2) w Reflex to ID Panel     Status: None (Preliminary result)   Collection Time: 02/20/21 10:37 AM   Specimen: BLOOD RIGHT HAND  Result Value Ref Range Status   Specimen Description BLOOD RIGHT HAND  Final   Special Requests   Final    BOTTLES DRAWN AEROBIC ONLY Blood Culture results may not be optimal due to an inadequate volume of blood received in culture bottles   Culture   Final    NO GROWTH 4 DAYS Performed at [facility], [place] [address] [place], [place] [address]    Report Status PENDING  Incomplete  Culture, blood (Routine X 2) w Reflex to ID Panel     Status: None (Preliminary result)   Collection Time: 02/20/21 10:45 AM   Specimen: BLOOD  Result Value Ref Range Status   Specimen Description BLOOD RIGHT ANTECUBITAL  Final   Special Requests   Final    BOTTLES DRAWN AEROBIC ONLY Blood Culture results may not be optimal due to an inadequate volume of blood received in culture bottles   Culture   Final    NO GROWTH 4 DAYS Performed at [facility], [place] [address] [place], [place] [address]    Report Status PENDING  Incomplete     Medications:    acetaminophen  1,000 mg Oral Q6H   enoxaparin (LOVENOX) injection  40 mg Subcutaneous Q24H   melatonin  3 mg Oral QHS   sodium chloride flush  3 mL Intravenous Q12H   Continuous Infusions:  sodium chloride     methocarbamol (ROBAXIN) IV      piperacillin-tazobactam (ZOSYN)  IV 3.375 g (02/24/21 0601)      LOS: 7 days   Charlynne Cousins  [facility]  02/24/2021, 9:57 AM

## 2021-02-25 DIAGNOSIS — R1011 Right upper quadrant pain: Secondary | ICD-10-CM | POA: Diagnosis not present

## 2021-02-25 DIAGNOSIS — F03918 Unspecified dementia, unspecified severity, with other behavioral disturbance: Secondary | ICD-10-CM | POA: Diagnosis not present

## 2021-02-25 DIAGNOSIS — D518 Other vitamin B12 deficiency anemias: Secondary | ICD-10-CM | POA: Diagnosis not present

## 2021-02-25 LAB — CULTURE, BLOOD (ROUTINE X 2)
Culture: NO GROWTH
Culture: NO GROWTH

## 2021-02-25 LAB — CBC
HCT: 32.9 % — ABNORMAL LOW (ref 36.0–46.0)
Hemoglobin: 10.7 g/dL — ABNORMAL LOW (ref 12.0–15.0)
MCH: 28.4 pg (ref 26.0–34.0)
MCHC: 32.5 g/dL (ref 30.0–36.0)
MCV: 87.3 fL (ref 80.0–100.0)
Platelets: 635 10*3/uL — ABNORMAL HIGH (ref 150–400)
RBC: 3.77 MIL/uL — ABNORMAL LOW (ref 3.87–5.11)
RDW: 13.5 % (ref 11.5–15.5)
WBC: 16.6 10*3/uL — ABNORMAL HIGH (ref 4.0–10.5)
nRBC: 0 % (ref 0.0–0.2)

## 2021-02-25 MED ORDER — POTASSIUM CHLORIDE 20 MEQ PO PACK
40.0000 meq | PACK | Freq: Two times a day (BID) | ORAL | Status: AC
  Administered 2021-02-25: 40 meq via ORAL
  Filled 2021-02-25: qty 2

## 2021-02-25 NOTE — Progress Notes (Signed)
At 2:20 am patients JP drain was found on the floor removed by the patient. Drain appears to be intact and patient is exhibiting less agitation post removal. Drain had minimal output of about 15 ml for the entire shift. Patient needed sitter but there were none available per staffing and delirium precautions contraindicated applying mittens. Site has been dressed and patient does not seem to be in any distress. NP notified!

## 2021-02-25 NOTE — Care Plan (Signed)
Pt is extremely restless and refused to stay in bed after being redirected several time.

## 2021-02-25 NOTE — Care Plan (Signed)
Haldol given to pt to calm her agitation.

## 2021-02-25 NOTE — Care Plan (Signed)
Provider notified for orders for a 1:1 sitter for pt.  Please see new orders

## 2021-02-25 NOTE — Care Plan (Signed)
No results from haldol.  Pt is very restless. Unable to calm pt.

## 2021-02-25 NOTE — Progress Notes (Signed)
5 Days Post-Op   Subjective/Chief Complaint: Patient sleeping.  Did not awaken.  Pulled surgical drain out overnight   Objective: Vital signs in last 24 hours: Temp:  [97.7 F (36.5 C)-98.6 F (37 C)] 98.3 F (36.8 C) (12/06 0418) Pulse Rate:  [70-102] 83 (12/06 0418) Resp:  [18-20] 20 (12/06 0418) BP: (112-164)/(58-96) 134/70 (12/06 0418) SpO2:  [97 %-98 %] 97 % (12/06 0418) Last BM Date: 02/21/21  Intake/Output from previous day: 12/05 0701 - 12/06 0700 In: 480 [P.O.:480] Out: -  Intake/Output this shift: No intake/output data recorded.   Gen:  comfortable, sleeping Respiratory: work of breathing normal  Lab Results:  Recent Labs    02/24/21 1051 02/25/21 1024  WBC 13.6* 16.6*  HGB 10.0* 10.7*  HCT 31.5* 32.9*  PLT 549* 635*   BMET Recent Labs    02/24/21 1051  NA 136  K 3.1*  CL 103  CO2 25  GLUCOSE 120*  BUN 7*  CREATININE 0.92  CALCIUM 9.1   PT/INR No results for input(s): LABPROT, INR in the last 72 hours. ABG No results for input(s): PHART, HCO3 in the last 72 hours.  Invalid input(s): PCO2, PO2  Studies/Results: No results found.  Anti-infectives: Anti-infectives (From admission, onward)    Start     Dose/Rate Route Frequency Ordered Stop   02/17/21 1400  piperacillin-tazobactam (ZOSYN) IVPB 3.375 g  Status:  Discontinued        3.375 g 12.5 mL/hr over 240 Minutes Intravenous Every 8 hours 02/17/21 1028 02/24/21 1340   02/16/21 1715  piperacillin-tazobactam (ZOSYN) IVPB 3.375 g        3.375 g 100 mL/hr over 30 Minutes Intravenous  Once 02/16/21 1701 02/16/21 2217       Assessment/Plan: POD 7, s/p lap chole for calculous cholecystitis with concern for choledocholithiasis by Dr. Sheliah Hatch on 11/29 - ERCP 12/1 w/out choledocholithiasis but with possible cystic stump leak - patient without obvious bile in JP drain prior to removal. -WBC up to 13K yesterday.  Recheck today.  If up then will plan for CT to rule out infectious etiology  or undrained collection.  If normal will monitor clinically given to clinical evidence of bile leak previously.  FEN - HH diet VTE - Lovenox ID - zosyn stopped 12/5  LOS: 8 days    Letha Cape MD 02/25/2021

## 2021-02-25 NOTE — Progress Notes (Addendum)
[facility] PROGRESS NOTE    Progress Note  [name]  WEX:[id number] DOB: 1935/06/26 DOA: 02/16/2021 PCP: Derrel Nip, MD     Brief Narrative:   Ebony Pierce is an 85 y.o. female past medical history significant of dementia, vitamin D B12 deficiency who comes into the ED for right upper quadrant pain was seen in the ED on 02/13/2021 with similar complaints labs were unremarkable.  She returned to the ED with the same complaint right upper quadrant ultrasound showed normal gallbladder irregular thickening of the wall and edema with up to 9 mm associated with pericolic cystic fluid positive Murphy sign surgery was consulted she is status post surgical intervention on 02/18/2021 with inflamed gangrenous gallbladder bile duct there was a concern for round stricture at the ampulla and no flow of contrast into the duodenum.  ERCP was done on 02/20/2021 that showed large periampullary diverticulum, no gallstones sphincterotomy was performed    Assessment/Plan: GI on   RUQ pain in the setting of transaminitis concern for cholecystitis: Continue Zosyn per Surgery She pulled her drain out last night. Follow Hbg transfuse if necessary.   Further management per General surgery recommended Continue current diet and management per surgery. She has remained afebrile, but she is developing a leukocytosis with thrombocytosis. Minimal drain output Check a CT scan of the abdomen and pelvis  Fever and leukocytosis:  T-max of 98.6 Continue IV Zosyn.  Culture data negative date. Antibiotic duration per general surgery  Hypokalemia: Replete orally recheck tomorrow morning.  Dementia with behavioral disturbances: At high risk of aspiration and delirium monitor closely. Fortunately Zosyn should cover.  Vitamin B12 deficiency: Checked this year it was stable.   DVT prophylaxis: lovenox Family Communication:none Status is: Inpatient  Remains inpatient appropriate because:  Acute cholecystitis which will not require ERCP.      Code Status:     Code Status Orders  (From admission, onward)           Start     Ordered   02/16/21 1806  Full code  Continuous        02/16/21 1809           Code Status History     This patient has a current code status but no historical code status.         IV Access:   Peripheral IV   Procedures and diagnostic studies:   No results found.   Medical Consultants:   None.   Subjective:    Ebony Pierce tolerating her diet Objective:    Vitals:   02/24/21 0722 02/24/21 1256 02/24/21 2015 02/25/21 0418  BP: (!) 142/79 (!) 112/58 (!) 164/96 134/70  Pulse: 86 70 (!) 102 83  Resp: 18 18 20 20   Temp: 99.3 F (37.4 C) 97.7 F (36.5 C) 98.6 F (37 C) 98.3 F (36.8 C)  TempSrc: Oral     SpO2: 98% 98% 98% 97%  Weight:      Height:       SpO2: 97 % O2 Flow Rate (L/min): 2 L/min   Intake/Output Summary (Last 24 hours) at 02/25/2021 0750 Last data filed at 02/24/2021 1257 Gross per 24 hour  Intake 240 ml  Output --  Net 240 ml    Filed Weights   02/23/21 1711  Weight: 65.5 kg    Exam: General exam: In no acute distress. Respiratory system: Good air movement and clear to auscultation. Cardiovascular system: S1 & S2 heard, RRR. No JVD. Gastrointestinal system: Abdomen is  nondistended, soft and nontender.  Drain in place with minimal drainage Extremities: No pedal edema.   Data Reviewed:    Labs: Basic Metabolic Panel: Recent Labs  Lab 02/19/21 0328 02/19/21 2015 02/21/21 0750 02/22/21 0054 02/24/21 1051  NA 136 134* 139 137 136  K 3.7 3.9 4.3 3.5 3.1*  CL 106 102 105 104 103  CO2 20* 22 24 25 25   GLUCOSE 120* 152* 121* 122* 120*  BUN 11 7* 11 12 7*  CREATININE 0.75 0.77 0.83 0.84 0.92  CALCIUM 8.8* 8.8* 9.5 9.1 9.1    GFR Estimated Creatinine Clearance: 41.2 mL/min (by C-G formula based on SCr of 0.92 mg/dL). Liver Function Tests: Recent Labs  Lab  02/19/21 0328 02/19/21 2015 02/21/21 0750 02/22/21 0054 02/24/21 1051  AST 95* 69* 49* 30 37  ALT 77* 72* 67* 47* 40  ALKPHOS 134* 131* 232* 170* 149*  BILITOT 1.0 0.8 0.9 0.4 0.5  PROT 5.6* 5.9* 6.5 6.0* 6.0*  ALBUMIN 2.7* 2.7* 2.7* 2.5* 2.4*    Recent Labs  Lab 02/22/21 0054  LIPASE 27    No results for input(s): AMMONIA in the last 168 hours. Coagulation profile No results for input(s): INR, PROTIME in the last 168 hours.  COVID-19 Labs  No results for input(s): DDIMER, FERRITIN, LDH, CRP in the last 72 hours.  Lab Results  Component Value Date   [place] NEGATIVE 02/16/2021    CBC: Recent Labs  Lab 02/19/21 0328 02/20/21 0149 02/21/21 0750 02/22/21 0054 02/24/21 1051  WBC 10.5 11.3* 14.4* 10.5 13.6*  NEUTROABS  --  8.7*  --   --   --   HGB 12.3 11.6* 12.3 10.7* 10.0*  HCT 38.8 37.1 38.7 33.4* 31.5*  MCV 92.4 90.7 90.0 89.5 88.2  PLT 275 278 381 410* 549*    Cardiac Enzymes: No results for input(s): CKTOTAL, CKMB, CKMBINDEX, TROPONINI in the last 168 hours. BNP (last 3 results) No results for input(s): PROBNP in the last 8760 hours. CBG: No results for input(s): GLUCAP in the last 168 hours. D-Dimer: No results for input(s): DDIMER in the last 72 hours. Hgb A1c: No results for input(s): HGBA1C in the last 72 hours. Lipid Profile: No results for input(s): CHOL, HDL, LDLCALC, TRIG, CHOLHDL, LDLDIRECT in the last 72 hours. Thyroid function studies: No results for input(s): TSH, T4TOTAL, T3FREE, THYROIDAB in the last 72 hours.  Invalid input(s): FREET3 Anemia work up: No results for input(s): VITAMINB12, FOLATE, FERRITIN, TIBC, IRON, RETICCTPCT in the last 72 hours. Sepsis Labs: Recent Labs  Lab 02/20/21 0149 02/21/21 0750 02/22/21 0054 02/24/21 1051  WBC 11.3* 14.4* 10.5 13.6*    Microbiology Recent Results (from the past 240 hour(s))  Resp Panel by RT-PCR (Flu A&B, Covid) Nasopharyngeal Swab     Status: None   Collection Time:  02/16/21 10:38 PM   Specimen: Nasopharyngeal Swab; Nasopharyngeal(NP) swabs in vial transport medium  Result Value Ref Range Status   SARS Coronavirus 2 by RT PCR NEGATIVE NEGATIVE Final    Comment: (NOTE) SARS-CoV-2 target nucleic acids are NOT DETECTED.  The SARS-CoV-2 RNA is generally detectable in upper respiratory specimens during the acute phase of infection. The lowest concentration of SARS-CoV-2 viral copies this assay can detect is 138 copies/mL. A negative result does not preclude SARS-Cov-2 infection and should not be used as the sole basis for treatment or other patient management decisions. A negative result may occur with  improper specimen collection/handling, submission of specimen other than nasopharyngeal swab, presence of viral  mutation(s) within the areas targeted by this assay, and inadequate number of viral copies(<138 copies/mL). A negative result must be combined with clinical observations, patient history, and epidemiological information. The expected result is Negative.  Fact Sheet for Patients:  EntrepreneurPulse.com.au  Fact Sheet for Healthcare Providers:  IncredibleEmployment.be  This test is no t yet approved or cleared by the [place] FDA and  has been authorized for detection and/or diagnosis of SARS-CoV-2 by FDA under an Emergency Use Authorization (EUA). This EUA will remain  in effect (meaning this test can be used) for the duration of the COVID-19 declaration under Section 564(b)(1) of the Act, 21 U.S.C.section 360bbb-3(b)(1), unless the authorization is terminated  or revoked sooner.       Influenza A by PCR NEGATIVE NEGATIVE Final   Influenza B by PCR NEGATIVE NEGATIVE Final    Comment: (NOTE) The Xpert Xpress SARS-CoV-2/FLU/RSV plus assay is intended as an aid in the diagnosis of influenza from Nasopharyngeal swab specimens and should not be used as a sole basis for treatment. Nasal washings  and aspirates are unacceptable for Xpert Xpress SARS-CoV-2/FLU/RSV testing.  Fact Sheet for Patients: EntrepreneurPulse.com.au  Fact Sheet for Healthcare Providers: IncredibleEmployment.be  This test is not yet approved or cleared by the [place] FDA and has been authorized for detection and/or diagnosis of SARS-CoV-2 by FDA under an Emergency Use Authorization (EUA). This EUA will remain in effect (meaning this test can be used) for the duration of the COVID-19 declaration under Section 564(b)(1) of the Act, 21 U.S.C. section 360bbb-3(b)(1), unless the authorization is terminated or revoked.  Performed at [facility], [place] [address] [place], [place] [address]   Culture, blood (Routine X 2) w Reflex to ID Panel     Status: None (Preliminary result)   Collection Time: 02/20/21 10:37 AM   Specimen: BLOOD RIGHT HAND  Result Value Ref Range Status   Specimen Description BLOOD RIGHT HAND  Final   Special Requests   Final    BOTTLES DRAWN AEROBIC ONLY Blood Culture results may not be optimal due to an inadequate volume of blood received in culture bottles   Culture   Final    NO GROWTH 4 DAYS Performed at [facility], [place] [address], [place], [place] [address]    Report Status PENDING  Incomplete  Culture, blood (Routine X 2) w Reflex to ID Panel     Status: None (Preliminary result)   Collection Time: 02/20/21 10:45 AM   Specimen: BLOOD  Result Value Ref Range Status   Specimen Description BLOOD RIGHT ANTECUBITAL  Final   Special Requests   Final    BOTTLES DRAWN AEROBIC ONLY Blood Culture results may not be optimal due to an inadequate volume of blood received in culture bottles   Culture   Final    NO GROWTH 4 DAYS Performed at [facility], [place] [address] [place], [place] [address]    Report Status PENDING  Incomplete     Medications:    acetaminophen  1,000 mg Oral Q6H   enoxaparin (LOVENOX) injection  40  mg Subcutaneous Q24H   melatonin  3 mg Oral QHS   sodium chloride flush  3 mL Intravenous Q12H   Continuous Infusions:  sodium chloride     methocarbamol (ROBAXIN) IV        LOS: 8 days   Charlynne Cousins  [facility]  02/25/2021, 7:50 AM

## 2021-02-26 ENCOUNTER — Encounter: Payer: Self-pay | Admitting: Family Medicine

## 2021-02-26 ENCOUNTER — Inpatient Hospital Stay (HOSPITAL_COMMUNITY): Payer: Medicare Other

## 2021-02-26 DIAGNOSIS — D518 Other vitamin B12 deficiency anemias: Secondary | ICD-10-CM

## 2021-02-26 DIAGNOSIS — F03918 Unspecified dementia, unspecified severity, with other behavioral disturbance: Secondary | ICD-10-CM | POA: Diagnosis not present

## 2021-02-26 DIAGNOSIS — R932 Abnormal findings on diagnostic imaging of liver and biliary tract: Secondary | ICD-10-CM | POA: Diagnosis not present

## 2021-02-26 DIAGNOSIS — R1011 Right upper quadrant pain: Secondary | ICD-10-CM | POA: Diagnosis not present

## 2021-02-26 LAB — BASIC METABOLIC PANEL
Anion gap: 11 (ref 5–15)
BUN: 12 mg/dL (ref 8–23)
CO2: 24 mmol/L (ref 22–32)
Calcium: 9.1 mg/dL (ref 8.9–10.3)
Chloride: 100 mmol/L (ref 98–111)
Creatinine, Ser: 0.78 mg/dL (ref 0.44–1.00)
GFR, Estimated: >60 mL/min (ref >60)
Glucose, Bld: 103 mg/dL — ABNORMAL HIGH (ref 70–99)
Potassium: 4 mmol/L (ref 3.5–5.1)
Sodium: 135 mmol/L (ref 135–145)

## 2021-02-26 LAB — CBC
HCT: 33.1 % — ABNORMAL LOW (ref 36.0–46.0)
Hemoglobin: 10.9 g/dL — ABNORMAL LOW (ref 12.0–15.0)
MCH: 29 pg (ref 26.0–34.0)
MCHC: 32.9 g/dL (ref 30.0–36.0)
MCV: 88 fL (ref 80.0–100.0)
Platelets: 603 10*3/uL — ABNORMAL HIGH (ref 150–400)
RBC: 3.76 MIL/uL — ABNORMAL LOW (ref 3.87–5.11)
RDW: 13.7 % (ref 11.5–15.5)
WBC: 14.2 10*3/uL — ABNORMAL HIGH (ref 4.0–10.5)
nRBC: 0 % (ref 0.0–0.2)

## 2021-02-26 MED ORDER — IOHEXOL 350 MG/ML SOLN
75.0000 mL | Freq: Once | INTRAVENOUS | Status: AC | PRN
  Administered 2021-02-26: 75 mL via INTRAVENOUS

## 2021-02-26 NOTE — Progress Notes (Signed)
6 Days Post-Op   Subjective/Chief Complaint: Calm sitting up in her chair.  Sitter states she ate all of her breakfast.  Intermittently gets out of her chair.  Can not figure out how to speak to the interpreter on the ipad so no information from the patient available.   Objective: Vital signs in last 24 hours: Temp:  [98.2 F (36.8 C)] 98.2 F (36.8 C) (12/07 0757) Pulse Rate:  [94-110] 94 (12/07 0757) Resp:  [14-17] 14 (12/07 0757) BP: (104-153)/(60-63) 104/63 (12/07 0757) SpO2:  [100 %] 100 % (12/07 0757) Last BM Date: 02/21/21  Intake/Output from previous day: 12/06 0701 - 12/07 0700 In: 360 [P.O.:360] Out: -  Intake/Output this shift: No intake/output data recorded.   Gen:  comfortable, awake Abd: soft, appropriately tender, +BS, ND, incisions c/d/i  Lab Results:  Recent Labs    02/25/21 1024 02/26/21 0127  WBC 16.6* 14.2*  HGB 10.7* 10.9*  HCT 32.9* 33.1*  PLT 635* 603*   BMET Recent Labs    02/24/21 1051 02/26/21 0127  NA 136 135  K 3.1* 4.0  CL 103 100  CO2 25 24  GLUCOSE 120* 103*  BUN 7* 12  CREATININE 0.92 0.78  CALCIUM 9.1 9.1   PT/INR No results for input(s): LABPROT, INR in the last 72 hours. ABG No results for input(s): PHART, HCO3 in the last 72 hours.  Invalid input(s): PCO2, PO2  Studies/Results: No results found.  Anti-infectives: Anti-infectives (From admission, onward)    Start     Dose/Rate Route Frequency Ordered Stop   02/17/21 1400  piperacillin-tazobactam (ZOSYN) IVPB 3.375 g  Status:  Discontinued        3.375 g 12.5 mL/hr over 240 Minutes Intravenous Every 8 hours 02/17/21 1028 02/24/21 1340   02/16/21 1715  piperacillin-tazobactam (ZOSYN) IVPB 3.375 g        3.375 g 100 mL/hr over 30 Minutes Intravenous  Once 02/16/21 1701 02/16/21 2217       Assessment/Plan: POD 8, s/p lap chole for calculous cholecystitis with concern for choledocholithiasis by Dr. Sheliah Hatch on 11/29 - ERCP 12/1 w/out choledocholithiasis but  with possible cystic stump leak - patient without obvious bile in JP drain prior to removal. -WBC back down to 14 without abx therapy.  CT scan pending to rule out intra-abdominal complication given history from patient is very limited. -if CT negative, patient can DC home.  She has close follow up with Dr. Sheliah Hatch next week  FEN - HH diet VTE - Lovenox ID - zosyn stopped 12/5  LOS: 9 days    Letha Cape MD 02/26/2021

## 2021-02-26 NOTE — Progress Notes (Signed)
[facility]  PROGRESS NOTE  Ebony Pierce PPJ:[id number] DOB: [birth year] DOA: 02/16/2021 PCP: Derrel Nip, MD   Brief HPI:    85 year old female with medical history of dementia, B12 deficiency came to ED with right upper quadrant pain.  She was seen in the ED on 11/24 with similar complaints for 1 day.  Lab work was normal at that time.  CT abdomen pelvis was attempted but patient pulled out and  left since no longer had pain.  She returned to the ED.  Complains of right upper quadrant pain.  Denies nausea vomiting or diarrhea. Right upper quadrant ultrasound showed normal gallbladder, irregularly thickened wall with edema measuring up to 9 mm in thickness, associated with small amount of pericholecystic fluid as well as positive Murphy sign.  General surgery was consulted. Patient underwent cholecystectomy, Was found to have acutely inflamed gangrenous gallbladder, IOC revealed tapering of CBD without stone.ERCP was done on 02/20/2021 which showed large periampullary diverticulum, no gallstones, sphincterotomy was performed.   Subjective   Patient seen and examined, no new complaints.   Assessment/Plan:    Right upper quadrant pain in setting of transaminitis -Abnormally thickened gallbladder seen on abdominal ultrasound -General surgery at initially recommended HIDA scan however it was canceled -MRCP ordered for today; but patient could not complete MRCP -General surgery took patient down for laparoscopic cholecystectomy and IOC. -IOC revealed tapering of CBD without stone. -Patient underwent ERCP which showed large periampullary diverticulum, no gallstones, sphincterotomy was performed -Patient was started on IV Zosyn which was stopped on 02/24/2021 -There was a question of bile leak however no bile noted in the JP drain. -General surgery has ordered CT abdomen/pelvis to look for complication -If CT abdomen pelvis is unremarkable, likely discharge home in next 24  hours  Leukocytosis -Patient WBC count is 14,000, likely reactive -IV antibiotics were stopped on 02/24/2021  Hypokalemia -Replete  Dementia with behavior disturbance -Delirium precautions  B12 deficiency anemia -Recent B12 level on 10/01/2020 was within normal limits   Medications     acetaminophen  1,000 mg Oral Q6H   enoxaparin (LOVENOX) injection  40 mg Subcutaneous Q24H   melatonin  3 mg Oral QHS   sodium chloride flush  3 mL Intravenous Q12H     Data Reviewed:   CBG:  No results for input(s): GLUCAP in the last 168 hours.  SpO2: 100 % O2 Flow Rate (L/min): 2 L/min    Vitals:   02/25/21 0418 02/25/21 2104 02/26/21 0757 02/26/21 1646  BP: 134/70 (!) 153/60 104/63 (!) 151/92  Pulse: 83 (!) 110 94 89  Resp: 20 17 14 16   Temp: 98.3 F (36.8 C)  98.2 F (36.8 C) 98.6 F (37 C)  TempSrc:    Oral  SpO2: 97% 100% 100% 100%  Weight:      Height:         Intake/Output Summary (Last 24 hours) at 02/26/2021 1654 Last data filed at 02/26/2021 1300 Gross per 24 hour  Intake 600 ml  Output --  Net 600 ml    12/05 1901 - 12/07 0700 In: 360 [P.O.:360] Out: -   Filed Weights   02/23/21 1711  Weight: 65.5 kg    Data Reviewed: Basic Metabolic Panel: Recent Labs  Lab 02/19/21 2015 02/21/21 0750 02/22/21 0054 02/24/21 1051 02/26/21 0127  NA 134* 139 137 136 135  K 3.9 4.3 3.5 3.1* 4.0  CL 102 105 104 103 100  CO2 22 24 25 25 24   GLUCOSE 152* 121* 122*  120* 103*  BUN 7* 11 12 7* 12  CREATININE 0.77 0.83 0.84 0.92 0.78  CALCIUM 8.8* 9.5 9.1 9.1 9.1   Liver Function Tests: Recent Labs  Lab 02/19/21 2015 02/21/21 0750 02/22/21 0054 02/24/21 1051  AST 69* 49* 30 37  ALT 72* 67* 47* 40  ALKPHOS 131* 232* 170* 149*  BILITOT 0.8 0.9 0.4 0.5  PROT 5.9* 6.5 6.0* 6.0*  ALBUMIN 2.7* 2.7* 2.5* 2.4*   Recent Labs  Lab 02/22/21 0054  LIPASE 27   No results for input(s): AMMONIA in the last 168 hours. CBC: Recent Labs  Lab 02/20/21 0149  02/21/21 0750 02/22/21 0054 02/24/21 1051 02/25/21 1024 02/26/21 0127  WBC 11.3* 14.4* 10.5 13.6* 16.6* 14.2*  NEUTROABS 8.7*  --   --   --   --   --   HGB 11.6* 12.3 10.7* 10.0* 10.7* 10.9*  HCT 37.1 38.7 33.4* 31.5* 32.9* 33.1*  MCV 90.7 90.0 89.5 88.2 87.3 88.0  PLT 278 381 410* 549* 635* 603*   Cardiac Enzymes: No results for input(s): CKTOTAL, CKMB, CKMBINDEX, TROPONINI in the last 168 hours. BNP (last 3 results) No results for input(s): BNP in the last 8760 hours.  ProBNP (last 3 results) No results for input(s): PROBNP in the last 8760 hours.  CBG: No results for input(s): GLUCAP in the last 168 hours.     Radiology Reports  No results found.     Antibiotics: Anti-infectives (From admission, onward)    Start     Dose/Rate Route Frequency Ordered Stop   02/17/21 1400  piperacillin-tazobactam (ZOSYN) IVPB 3.375 g  Status:  Discontinued        3.375 g 12.5 mL/hr over 240 Minutes Intravenous Every 8 hours 02/17/21 1028 02/24/21 1340   02/16/21 1715  piperacillin-tazobactam (ZOSYN) IVPB 3.375 g        3.375 g 100 mL/hr over 30 Minutes Intravenous  Once 02/16/21 1701 02/16/21 2217         DVT prophylaxis: Lovenox  Code Status: Full code  Family Communication: No family at bedside   Consultants: General surgery Gastroenterology  Procedures:     Objective    Physical Examination:   General-appears in no acute distress Heart-S1-S2, regular, no murmur auscultated Lungs-clear to auscultation bilaterally, no wheezing or crackles auscultated Abdomen-soft, nontender, no organomegaly Extremities-no edema in the lower extremities Neuro-alert, oriented x3, no focal deficit noted  Status is: Inpatient  Dispo: The patient is from: Home              Anticipated d/c is to: Home              Anticipated d/c date is: 02/20/2021              Patient currently not stable for discharge  Barrier to discharge-ongoing evaluation for right upper quadrant  pain  COVID-19 Labs  No results for input(s): DDIMER, FERRITIN, LDH, CRP in the last 72 hours.  Lab Results  Component Value Date   [place] NEGATIVE 02/16/2021            Recent Results (from the past 240 hour(s))  Resp Panel by RT-PCR (Flu A&B, Covid) Nasopharyngeal Swab     Status: None   Collection Time: 02/16/21 10:38 PM   Specimen: Nasopharyngeal Swab; Nasopharyngeal(NP) swabs in vial transport medium  Result Value Ref Range Status   SARS Coronavirus 2 by RT PCR NEGATIVE NEGATIVE Final    Comment: (NOTE) SARS-CoV-2 target nucleic acids are NOT DETECTED.  The  SARS-CoV-2 RNA is generally detectable in upper respiratory specimens during the acute phase of infection. The lowest concentration of SARS-CoV-2 viral copies this assay can detect is 138 copies/mL. A negative result does not preclude SARS-Cov-2 infection and should not be used as the sole basis for treatment or other patient management decisions. A negative result may occur with  improper specimen collection/handling, submission of specimen other than nasopharyngeal swab, presence of viral mutation(s) within the areas targeted by this assay, and inadequate number of viral copies(<138 copies/mL). A negative result must be combined with clinical observations, patient history, and epidemiological information. The expected result is Negative.  Fact Sheet for Patients:  EntrepreneurPulse.com.au  Fact Sheet for Healthcare Providers:  IncredibleEmployment.be  This test is no t yet approved or cleared by the [place] FDA and  has been authorized for detection and/or diagnosis of SARS-CoV-2 by FDA under an Emergency Use Authorization (EUA). This EUA will remain  in effect (meaning this test can be used) for the duration of the COVID-19 declaration under Section 564(b)(1) of the Act, 21 U.S.C.section 360bbb-3(b)(1), unless the authorization is terminated  or revoked  sooner.       Influenza A by PCR NEGATIVE NEGATIVE Final   Influenza B by PCR NEGATIVE NEGATIVE Final    Comment: (NOTE) The Xpert Xpress SARS-CoV-2/FLU/RSV plus assay is intended as an aid in the diagnosis of influenza from Nasopharyngeal swab specimens and should not be used as a sole basis for treatment. Nasal washings and aspirates are unacceptable for Xpert Xpress SARS-CoV-2/FLU/RSV testing.  Fact Sheet for Patients: EntrepreneurPulse.com.au  Fact Sheet for Healthcare Providers: IncredibleEmployment.be  This test is not yet approved or cleared by the [place] FDA and has been authorized for detection and/or diagnosis of SARS-CoV-2 by FDA under an Emergency Use Authorization (EUA). This EUA will remain in effect (meaning this test can be used) for the duration of the COVID-19 declaration under Section 564(b)(1) of the Act, 21 U.S.C. section 360bbb-3(b)(1), unless the authorization is terminated or revoked.  Performed at [facility], [place] [address] [place], [place] [address]   Culture, blood (Routine X 2) w Reflex to ID Panel     Status: None   Collection Time: 02/20/21 10:37 AM   Specimen: BLOOD RIGHT HAND  Result Value Ref Range Status   Specimen Description BLOOD RIGHT HAND  Final   Special Requests   Final    BOTTLES DRAWN AEROBIC ONLY Blood Culture results may not be optimal due to an inadequate volume of blood received in culture bottles   Culture   Final    NO GROWTH 5 DAYS Performed at [facility], [place] [address] [place], [place] [address]    Report Status 02/25/2021 FINAL  Final  Culture, blood (Routine X 2) w Reflex to ID Panel     Status: None   Collection Time: 02/20/21 10:45 AM   Specimen: BLOOD  Result Value Ref Range Status   Specimen Description BLOOD RIGHT ANTECUBITAL  Final   Special Requests   Final    BOTTLES DRAWN AEROBIC ONLY Blood Culture results may not be optimal due to an inadequate  volume of blood received in culture bottles   Culture   Final    NO GROWTH 5 DAYS Performed at [facility], [place] [address] [place],  [address]    Report Status 02/25/2021 FINAL  Final    Oswald Hillock   [facility] If 7PM-7AM, please contact night-coverage at [email or web address],  Office  [phone]   02/26/2021, 4:54 PM  LOS: 9 days

## 2021-02-27 ENCOUNTER — Encounter: Payer: Self-pay | Admitting: Family Medicine

## 2021-02-27 ENCOUNTER — Encounter (HOSPITAL_COMMUNITY): Payer: Self-pay | Admitting: Interventional Radiology

## 2021-02-27 ENCOUNTER — Inpatient Hospital Stay (HOSPITAL_COMMUNITY): Payer: Medicare Other

## 2021-02-27 DIAGNOSIS — D518 Other vitamin B12 deficiency anemias: Secondary | ICD-10-CM | POA: Diagnosis not present

## 2021-02-27 DIAGNOSIS — F03918 Unspecified dementia, unspecified severity, with other behavioral disturbance: Secondary | ICD-10-CM | POA: Diagnosis not present

## 2021-02-27 DIAGNOSIS — R1011 Right upper quadrant pain: Secondary | ICD-10-CM | POA: Diagnosis not present

## 2021-02-27 DIAGNOSIS — R932 Abnormal findings on diagnostic imaging of liver and biliary tract: Secondary | ICD-10-CM | POA: Diagnosis not present

## 2021-02-27 HISTORY — PX: IR US GUIDE BX ASP/DRAIN: IMG2392

## 2021-02-27 MED ORDER — FENTANYL CITRATE (PF) 100 MCG/2ML IJ SOLN
INTRAMUSCULAR | Status: AC | PRN
  Administered 2021-02-27: 50 ug via INTRAVENOUS

## 2021-02-27 MED ORDER — LIDOCAINE HCL (PF) 1 % IJ SOLN
INTRAMUSCULAR | Status: AC | PRN
  Administered 2021-02-27: 10 mL

## 2021-02-27 MED ORDER — LIDOCAINE HCL 1 % IJ SOLN
INTRAMUSCULAR | Status: AC
  Filled 2021-02-27: qty 20

## 2021-02-27 MED ORDER — FENTANYL CITRATE (PF) 100 MCG/2ML IJ SOLN
INTRAMUSCULAR | Status: AC
  Filled 2021-02-27: qty 2

## 2021-02-27 MED ORDER — IOHEXOL 300 MG/ML  SOLN: 100.0000 mL | Freq: Once | INTRAMUSCULAR | Status: DC | PRN

## 2021-02-27 MED ORDER — ENOXAPARIN SODIUM 40 MG/0.4ML IJ SOSY: 40.0000 mg | PREFILLED_SYRINGE | INTRAMUSCULAR | Status: DC

## 2021-02-27 MED ORDER — MIDAZOLAM HCL 2 MG/2ML IJ SOLN
INTRAMUSCULAR | Status: AC
  Filled 2021-02-27: qty 2

## 2021-02-27 MED ORDER — MIDAZOLAM HCL 2 MG/2ML IJ SOLN
INTRAMUSCULAR | Status: AC | PRN
  Administered 2021-02-27: 1 mg via INTRAVENOUS
  Administered 2021-02-27: .5 mg via INTRAVENOUS

## 2021-02-27 NOTE — Progress Notes (Signed)
7 Days Post-Op   Subjective/Chief Complaint: Calm, laying in bed   Objective: Vital signs in last 24 hours: Temp:  [97.5 F (36.4 C)-98.6 F (37 C)] 98.6 F (37 C) (12/08 0828) Pulse Rate:  [81-89] 89 (12/08 0828) Resp:  [16-17] 17 (12/08 0828) BP: (116-151)/(70-92) 116/70 (12/08 0828) SpO2:  [100 %] 100 % (12/08 0828) Last BM Date:  (Unknown)  Intake/Output from previous day: 12/07 0701 - 12/08 0700 In: 240 [P.O.:240] Out: -  Intake/Output this shift: No intake/output data recorded.   Gen:  comfortable, awake Abd: soft, appropriately tender, +BS, ND, incisions c/d/i  Lab Results:  Recent Labs    02/25/21 1024 02/26/21 0127  WBC 16.6* 14.2*  HGB 10.7* 10.9*  HCT 32.9* 33.1*  PLT 635* 603*   BMET Recent Labs    02/24/21 1051 02/26/21 0127  NA 136 135  K 3.1* 4.0  CL 103 100  CO2 25 24  GLUCOSE 120* 103*  BUN 7* 12  CREATININE 0.92 0.78  CALCIUM 9.1 9.1   PT/INR No results for input(s): LABPROT, INR in the last 72 hours. ABG No results for input(s): PHART, HCO3 in the last 72 hours.  Invalid input(s): PCO2, PO2  Studies/Results: CT ABDOMEN PELVIS W CONTRAST  Result Date: 02/26/2021 CLINICAL DATA:  RUQ abdominal pain; question of bile leak however no bile noted in the JP drain. General surgery has ordered CT abdomen/pelvis to look for complication. EXAM: CT ABDOMEN AND PELVIS WITH CONTRAST TECHNIQUE: Multidetector CT imaging of the abdomen and pelvis was performed using the standard protocol following bolus administration of intravenous contrast. CONTRAST:  18mL OMNIPAQUE IOHEXOL 350 MG/ML SOLN COMPARISON:  None. FINDINGS: Lower chest: Bibasilar linear atelectasis. Hepatobiliary: No focal liver abnormality. Status post cholecystectomy. Interval development of a gas and fluid collection within the gallbladder fossa measuring 6.1 x 5.4 x 5.9cm. This collection is noted to extend inferiorly along the right pericolic gutter within other component measuring  approximately 7.1 x 3.2 x 2.2 cm. Associated trace peripheral enhancement. No biliary dilatation. Pneumobilia noted. Pancreas: Diffusely atrophic. No focal lesion. Otherwise normal pancreatic contour. No surrounding inflammatory changes. No main pancreatic ductal dilatation. Spleen: Normal in size without focal abnormality. Adrenals/Urinary Tract: No adrenal nodule bilaterally. Bilateral kidneys enhance symmetrically. No hydronephrosis. No hydroureter. The urinary bladder is unremarkable. On delayed imaging, there is no urothelial wall thickening and there are no filling defects in the opacified portions of the bilateral collecting systems or ureters. Stomach/Bowel: Stomach is within normal limits. No evidence of bowel wall thickening or dilatation. Appendix appears normal. Vascular/Lymphatic: The main portal, splenic, superior mesenteric veins are patent. No abdominal aorta or iliac aneurysm. Moderate atherosclerotic plaque of the aorta and its branches. No abdominal, pelvic, or inguinal lymphadenopathy. Reproductive: Uterus and bilateral adnexa are unremarkable. Other: No intraperitoneal free fluid. No intraperitoneal free gas. No organized fluid collection. No drain identified. Musculoskeletal: No abdominal wall hernia or abnormality. No suspicious lytic or blastic osseous lesions. No acute displaced fracture. Grade 1 anterolisthesis of L4 on L5. Intervertebral disc space vacuum phenomenon at L4-L5 L5-S1 levels. IMPRESSION: Status post cholecystectomy with interval development of a 6 x 5 x 6 cm gas and fluid collection along the gallbladder fossa which extends along the right pericolic gutter within other large elongated component measuring 7 x 3 x 2 cm. Findings suggestive of biloma formation versus abscess. Electronically Signed   By: Tish Frederickson M.D.   On: 02/26/2021 19:23    Anti-infectives: Anti-infectives (From admission, onward)  Start     Dose/Rate Route Frequency Ordered Stop   02/17/21 1400   piperacillin-tazobactam (ZOSYN) IVPB 3.375 g  Status:  Discontinued        3.375 g 12.5 mL/hr over 240 Minutes Intravenous Every 8 hours 02/17/21 1028 02/24/21 1340   02/16/21 1715  piperacillin-tazobactam (ZOSYN) IVPB 3.375 g        3.375 g 100 mL/hr over 30 Minutes Intravenous  Once 02/16/21 1701 02/16/21 2217       Assessment/Plan: POD 9, s/p lap chole for calculous cholecystitis with concern for choledocholithiasis by Dr. Sheliah Hatch on 11/29 - ERCP 12/1 w/out choledocholithiasis but with possible cystic stump leak - patient without obvious bile in JP drain prior to removal. -WBC back down to 14 yesterday, but CT scan finally done and reveals a gb fossa fluid collection, likely c/w biloma.   -will have IR eval for drain placement. -patient unable to consent.  I have called the daughter to update her and she is agreeable to a drain.   FEN - HH diet VTE - Lovenox ID - zosyn stopped 12/5  LOS: 10 days    Letha Cape MD 02/27/2021

## 2021-02-27 NOTE — Consult Note (Signed)
Chief Complaint: Patient was seen in consultation today for  Chief Complaint  Patient presents with   Abdominal Pain    Referring Physician(s): Barnetta Chapel, PA-C  Supervising Physician: Malachy Moan  Patient Status: [facility] - In-pt  History of Present Illness: Ebony Pierce is an 85 y.o. Thai-speaking female with a medical history significant for dementia and recent cholecystectomy for acute calculous cholecystitis with concern for choledocholithiasis. The surgery was 02/18/21 and 19 fr. Blake drain was placed with the tip in the gallbladder fossa. An ERCP 12/1 was negative for choledocholithiasis but concerning for possible cystic stump leak. The patient's hospital course has been complicated by dementia-related behavioral disturbances including agitation, combativeness and refusing to speak to staff via translator. She pulled the drain out 02/25/21 and a CT scan was done that day to rule out bile leak - if negative the patient was to be discharged home.   CT Abdomen/Pelvis with contrast 02/25/21 IMPRESSION: Status post cholecystectomy with interval development of a 6 x 5 x 6 cm gas and fluid collection along the gallbladder fossa which extends along the right pericolic gutter within other large elongated component measuring 7 x 3 x 2 cm. Findings suggestive of biloma formation versus abscess.  Interventional Radiology has been asked to evaluate this patient for an image-guided gallbladder fossa fluid collection aspiration with possible drain placement. This case was reviewed and procedure approved by Dr. Archer Asa.   Past Medical History:  Diagnosis Date   Age-related hearing loss 10/19/2016   Cataract    s/p R cataract surgery   Dementia with behavioral disturbance 07/02/2015   [facility] 10/30 (Administered with assistance of [place] interpreter in exam room (11/12/16)   Reduced vision 09/16/2016   Weight loss, non-intentional 04/14/2018    Past Surgical History:   Procedure Laterality Date   CATARACT EXTRACTION Right 2012   CHOLECYSTECTOMY N/A 02/18/2021   Procedure: LAPAROSCOPIC CHOLECYSTECTOMY WITH INTRAOPERATIVE CHOLANGIOGRAM;  Surgeon: Sheliah Hatch De Blanch, MD;  Location: [facility] OR;  Service: General;  Laterality: N/A;   ENDOSCOPIC RETROGRADE CHOLANGIOPANCREATOGRAPHY (ERCP) WITH PROPOFOL N/A 02/20/2021   Procedure: ENDOSCOPIC RETROGRADE CHOLANGIOPANCREATOGRAPHY (ERCP) WITH PROPOFOL;  Surgeon: Hilarie Fredrickson, MD;  Location: [facility] ENDOSCOPY;  Service: Endoscopy;  Laterality: N/A;   SPHINCTEROTOMY  02/20/2021   Procedure: SPHINCTEROTOMY;  Surgeon: Hilarie Fredrickson, MD;  Location: [facility] ENDOSCOPY;  Service: Endoscopy;;    Allergies: Shellfish allergy  Medications: Prior to Admission medications   Medication Sig Start Date End Date Taking? Authorizing Provider  Famotidine-Ca Carb-Mag Hydrox (PEPCID COMPLETE PO) Take 1 tablet by mouth daily as needed (stomach).   Yes [provider]  Melatonin 5 MG CAPS Take 1-2 capsules (5-10 mg total) by mouth at bedtime. Patient taking differently: Take 10 capsules by mouth at bedtime. 08/13/20  Yes Derrel Nip, MD  MULTIPLE VITAMIN PO Take 1 tablet by mouth daily.   Yes [provider]  donepezil (ARICEPT) 10 MG tablet Take 1 tablet (10 mg total) by mouth at bedtime. Patient not taking: Reported on 02/16/2021 05/13/20   Derrel Nip, MD     Family History  Problem Relation Age of Onset   Hypertension Mother     Social History   Socioeconomic History   Marital status: Widowed    Spouse name: Not on file   Number of children: Not on file   Years of education: 12   Highest education level: Not on file  Occupational History   Not on file  Tobacco Use   Smoking status: Never  Smokeless tobacco: Never  Vaping Use   Vaping Use: Never used  Substance and Sexual Activity   Alcohol use: No    Alcohol/week: 0.0 standard drinks   Drug use: No   Sexual activity: Not Currently  Other Topics  Concern   Not on file  Social History Narrative   Lives at home with daughter and son-in-law.    Enjoys Retail banker and walking     Primary family support persons is daughter who is also her paid Merchandiser, retail.    Transportation to appointments provided by daughter [name].   Receives the following community support services SSI, food stamps and CAPS services.       Social Determinants of Health   Financial Resource Strain: Low Risk    Difficulty of Paying Living Expenses: Not hard at all  Food Insecurity: No Food Insecurity   Worried About Programme researcher, broadcasting/film/video in the Last Year: Never true   Ran Out of Food in the Last Year: Never true  Transportation Needs: No Transportation Needs   Lack of Transportation (Medical): No   Lack of Transportation (Non-Medical): No  Physical Activity: Not on file  Stress: Not on file  Social Connections: Not on file    Review of Systems: A 12 point ROS discussed and pertinent positives are indicated in the HPI above.  All other systems are negative.  Review of Systems  Unable to perform ROS: Other   Vital Signs: BP 116/70 (BP Location: Right Arm)   Pulse 89   Temp 98.6 F (37 C) (Oral)   Resp 17   Ht 5\' 2"  (1.575 m)   Wt 144 lb 6.4 oz (65.5 kg)   SpO2 100%   BMI 26.41 kg/m   Physical Exam Constitutional:      General: She is not in acute distress.    Comments: Patient does not speak English but she was calm and cooperative with following physical gestures - standing up, moving gown to the side to assess RUQ, etc.   HENT:     Mouth/Throat:     Mouth: Mucous membranes are moist.     Pharynx: Oropharynx is clear.  Cardiovascular:     Rate and Rhythm: Normal rate and regular rhythm.  Pulmonary:     Effort: Pulmonary effort is normal.     Breath sounds: Normal breath sounds.  Abdominal:     General: Bowel sounds are normal.     Palpations: Abdomen is soft.     Tenderness: There is no abdominal tenderness.     Comments: No obvious tenderness to  palpation. RUQ puncture site from prior drain is clearly visible. Site is clean, dry and without drainage or erythema.    Skin:    General: Skin is warm and dry.  Neurological:     Comments: Unable to assess. Patient does not speak .     Imaging: DG Cholangiogram Operative  Result Date: 02/18/2021 CLINICAL DATA:  Intraoperative cholangiogram during laparoscopic cholecystectomy. EXAM: INTRAOPERATIVE CHOLANGIOGRAM FLUOROSCOPY TIME:  16 seconds (3.2 mGy) COMPARISON:  CT abdomen and pelvis-02/16/2021 FINDINGS: Multiple spot intraoperative cholangiographic images of the right upper abdominal quadrant during laparoscopic cholecystectomy are provided for review. Surgical clips overlie the expected location of the gallbladder fossa. Contrast injection demonstrates selective cannulation of the central aspect of the cystic duct. There is extravasation of contrast about the cystic duct cannulation site there is however passage of contrast through the central aspect of the cystic duct with filling of a markedly dilated common bile  duct. There is minimal reflux of injected contrast into the common hepatic duct and central aspect of the mildly dilated intrahepatic biliary system. There is no definitive passage of contrast to the level of the distal aspect of the CBD. There is no definitive opacification of the duodenum. IMPRESSION: No definitive passage of contrast through the distal aspect of the CBD - while potentially technique related, an ampullary stenosis/occlusion or occlusive choledocholithiasis could result in a similar appearance. Further evaluation and management with ERCP could be performed indicated. Electronically Signed   By: Simonne Come M.D.   On: 02/18/2021 14:58   CT ABDOMEN PELVIS W CONTRAST  Result Date: 02/26/2021 CLINICAL DATA:  RUQ abdominal pain; question of bile leak however no bile noted in the JP drain. General surgery has ordered CT abdomen/pelvis to look for complication. EXAM:  CT ABDOMEN AND PELVIS WITH CONTRAST TECHNIQUE: Multidetector CT imaging of the abdomen and pelvis was performed using the standard protocol following bolus administration of intravenous contrast. CONTRAST:  38mL OMNIPAQUE IOHEXOL 350 MG/ML SOLN COMPARISON:  None. FINDINGS: Lower chest: Bibasilar linear atelectasis. Hepatobiliary: No focal liver abnormality. Status post cholecystectomy. Interval development of a gas and fluid collection within the gallbladder fossa measuring 6.1 x 5.4 x 5.9cm. This collection is noted to extend inferiorly along the right pericolic gutter within other component measuring approximately 7.1 x 3.2 x 2.2 cm. Associated trace peripheral enhancement. No biliary dilatation. Pneumobilia noted. Pancreas: Diffusely atrophic. No focal lesion. Otherwise normal pancreatic contour. No surrounding inflammatory changes. No main pancreatic ductal dilatation. Spleen: Normal in size without focal abnormality. Adrenals/Urinary Tract: No adrenal nodule bilaterally. Bilateral kidneys enhance symmetrically. No hydronephrosis. No hydroureter. The urinary bladder is unremarkable. On delayed imaging, there is no urothelial wall thickening and there are no filling defects in the opacified portions of the bilateral collecting systems or ureters. Stomach/Bowel: Stomach is within normal limits. No evidence of bowel wall thickening or dilatation. Appendix appears normal. Vascular/Lymphatic: The main portal, splenic, superior mesenteric veins are patent. No abdominal aorta or iliac aneurysm. Moderate atherosclerotic plaque of the aorta and its branches. No abdominal, pelvic, or inguinal lymphadenopathy. Reproductive: Uterus and bilateral adnexa are unremarkable. Other: No intraperitoneal free fluid. No intraperitoneal free gas. No organized fluid collection. No drain identified. Musculoskeletal: No abdominal wall hernia or abnormality. No suspicious lytic or blastic osseous lesions. No acute displaced fracture.  Grade 1 anterolisthesis of L4 on L5. Intervertebral disc space vacuum phenomenon at L4-L5 L5-S1 levels. IMPRESSION: Status post cholecystectomy with interval development of a 6 x 5 x 6 cm gas and fluid collection along the gallbladder fossa which extends along the right pericolic gutter within other large elongated component measuring 7 x 3 x 2 cm. Findings suggestive of biloma formation versus abscess. Electronically Signed   By: Tish Frederickson M.D.   On: 02/26/2021 19:23   CT ABDOMEN PELVIS W CONTRAST  Result Date: 02/16/2021 CLINICAL DATA:  Right lower quadrant abdominal pain. EXAM: CT ABDOMEN AND PELVIS WITH CONTRAST TECHNIQUE: Multidetector CT imaging of the abdomen and pelvis was performed using the standard protocol following bolus administration of intravenous contrast. CONTRAST:  OMNIPAQUE IOHEXOL 300 MG/ML  SOLN COMPARISON:  Ultrasound abdomen 02/16/2021. FINDINGS: Lower chest: Right-sided fat containing Bochdalek hernia. Scarring in the lung bases. Hepatobiliary: There is diffuse marked gallbladder wall thickening and edema. There is submucosal enhancement with some areas of questionable wall irregularity internally. There is no gallbladder air. Common bowel duct is dilated measuring up to 15 mm. No radiopaque gallstones are  identified. There is fatty infiltration of the liver. Pancreas: Unremarkable. No pancreatic ductal dilatation or surrounding inflammatory changes. Spleen: Normal in size without focal abnormality. Adrenals/Urinary Tract: Small bladder diverticulum is present. The kidneys and adrenal glands are within normal limits. Stomach/Bowel: Stomach is within normal limits. Appendix appears normal. No evidence of bowel wall thickening, distention, or inflammatory changes. Vascular/Lymphatic: Aortic atherosclerosis. No enlarged abdominal or pelvic lymph nodes. Reproductive: Uterus and bilateral adnexa are unremarkable. Prominent pelvic vessels are seen. Other: No abdominal wall hernia  or abnormality. No abdominopelvic ascites. Musculoskeletal: Degenerative changes affect the spine. There is grade 1 anterolisthesis at L4-L5. IMPRESSION: 1. Marked wall thickening of the gallbladder with some areas of mucosal irregularity. Biliary ductal dilatation. Findings are highly suspicious for acute cholecystitis. Wall irregularity can be seen in the setting of gangrenous cholecystitis. Please correlate clinically. Surgical consultation recommended. 2. Small bladder diverticulum. 3. Prominent pelvic vessels, nonspecific. 4.  Aortic Atherosclerosis (ICD10-I70.0). Electronically Signed   By: Darliss Cheney M.D.   On: 02/16/2021 22:07   DG Chest Port 1 View  Result Date: 02/13/2021 CLINICAL DATA:  Short of breath EXAM: PORTABLE CHEST 1 VIEW COMPARISON:  None. FINDINGS: Single frontal view of the chest demonstrates mild enlargement the cardiac silhouette. Background interstitial prominence throughout the lungs is likely related to chronic scarring. No airspace disease, effusion, or pneumothorax. IMPRESSION: 1. No acute intrathoracic process. Electronically Signed   By: Sharlet Salina M.D.   On: 02/13/2021 20:28   DG ERCP  Result Date: 02/20/2021 CLINICAL DATA:  ERCP EXAM: ERCP TECHNIQUE: Multiple spot images obtained with the fluoroscopic device and submitted for interpretation post-procedure. FLUOROSCOPY TIME:  Refer to procedure report COMPARISON:  None. FINDINGS: A total of 13 fluoroscopic spot films are submitted for review taken during ERCP. Initial images demonstrate a scope overlying the right upper quadrant. Wire catheterization of the common bile duct is performed, followed by contrast injection. Cholangiography demonstrates a dilated common bile duct, with no obvious filling defects identified. Balloon sweeps of the common bile duct is then performed. Final images demonstrate normal intrahepatic biliary tree with persistently dilated common bile duct and no filling defects identified.  IMPRESSION: Fluoroscopic images taken during ERCP as described. These images were submitted for radiologic interpretation only. Please see the procedural report for the amount of contrast and the fluoroscopy time utilized. Electronically Signed   By: Olive Bass M.D.   On: 02/20/2021 15:07   US Abdomen Limited RUQ (LIVER/GB)  Result Date: 02/16/2021 CLINICAL DATA:  Right upper quadrant pain. EXAM: ULTRASOUND ABDOMEN LIMITED RIGHT UPPER QUADRANT COMPARISON:  None. FINDINGS: Gallbladder: Mild to moderately distended. Wall is irregularly thickened linear areas wall edema. There is also pericholecystic fluid. Small amount of dependent sludge. One echogenic focus noted in the dependent gallbladder consistent with a polyp or nonshadowing stone, 5 mm in size. Patient is tender to transducer pressure over the gallbladder. Common bile duct: Diameter: 1 cm. No visualized duct stone. Most distal aspect of the duct was obscured by bowel gas. Liver: No focal lesion identified. Within normal limits in parenchymal echogenicity. Portal vein is patent on color Doppler imaging with normal direction of blood flow towards the liver. Other: None. IMPRESSION: 1. Abnormal gallbladder. Wall is irregularly thickened with wall edema, measuring up to 9 mm in thickness, associated with a small amount of pericholecystic fluid as well as a positive sonographic Murphy's sign. However, no shadowing stones are noted. Findings support acute cholecystitis in the proper clinical setting. Findings could be further assessed with  a nuclear medicine HIDA scan. 2. Dilated common bile duct to 1 cm.  No visualized duct stone. Electronically Signed   By: Amie Portland M.D.   On: 02/16/2021 15:51    Labs:  CBC: Recent Labs    02/22/21 0054 02/24/21 1051 02/25/21 1024 02/26/21 0127  WBC 10.5 13.6* 16.6* 14.2*  HGB 10.7* 10.0* 10.7* 10.9*  HCT 33.4* 31.5* 32.9* 33.1*  PLT 410* 549* 635* 603*    COAGS: Recent Labs    02/16/21 2044   INR 1.1    BMP: Recent Labs    02/21/21 0750 02/22/21 0054 02/24/21 1051 02/26/21 0127  NA 139 137 136 135  K 4.3 3.5 3.1* 4.0  CL 105 104 103 100  CO2 24 25 25 24   GLUCOSE 121* 122* 120* 103*  BUN 11 12 7* 12  CALCIUM 9.5 9.1 9.1 9.1  CREATININE 0.83 0.84 0.92 0.78  GFRNONAA >60 >60 >60 >60    LIVER FUNCTION TESTS: Recent Labs    02/19/21 2015 02/21/21 0750 02/22/21 0054 02/24/21 1051  BILITOT 0.8 0.9 0.4 0.5  AST 69* 49* 30 37  ALT 72* 67* 47* 40  ALKPHOS 131* 232* 170* 149*  PROT 5.9* 6.5 6.0* 6.0*  ALBUMIN 2.7* 2.7* 2.5* 2.4*    TUMOR MARKERS: No results for input(s): AFPTM, CEA, CA199, CHROMGRNA in the last 8760 hours.  Assessment and Plan:  Cholecystectomy with gallbladder fossa fluid collection: 14/05/22, 85 year old female, is tentatively scheduled today for an image-guided gallbladder fluid collection aspiration with possible drain placement. The procedure was discussed and consent obtained over the phone with the patient's daughter, 91. The patient was assessed at the bedside in her room on 5N. Safety sitter at the bedside.   Risks and benefits discussed with the patient including bleeding, infection, damage to adjacent structures, bowel perforation/fistula connection, and sepsis.  All of the patient's questions were answered, patient is agreeable to proceed. She has been NPO. Last dose of lovenox was 02/25/21 at 0842  Consent signed and in IR.   Thank you for this interesting consult.  I greatly enjoyed meeting Rishita Petron and look forward to participating in their care.  A copy of this report was sent to the requesting provider on this date.  Electronically Signed: Raoul Pitch, AGACNP-BC [phone] 02/27/2021, 12:15 PM   I spent a total of 20 Minutes    in face to face in clinical consultation, greater than 50% of which was counseling/coordinating care for gallbladder fossa fluid collection aspiration with drain  placement.

## 2021-02-27 NOTE — Procedures (Signed)
Interventional Radiology Procedure Note  Procedure:  1.) 66F drain placed into GB fossa collection with return of purulent bilious fluid.  Connected to bag. 2.) 75F drain placed into right paracolic gutter collection with return of bloody fluid.  Connected to bag.  Complications: None  Estimated Blood Loss: None  Recommendations: - Drains to gravity - Cultures sent - Flush drains Q shift  Signed,  Sterling Big, MD

## 2021-02-27 NOTE — Progress Notes (Signed)
[facility]  PROGRESS NOTE  Ebony Pierce [id number] DOB: 01/07/1936 DOA: 02/16/2021 PCP: Gifford Shave, MD   Brief HPI:    85 year old female with medical history of dementia, B12 deficiency came to ED with right upper quadrant pain.  She was seen in the ED on 11/24 with similar complaints for 1 day.  Lab work was normal at that time.  CT abdomen pelvis was attempted but patient pulled out and  left since no longer had pain.  She returned to the ED.  Complains of right upper quadrant pain.  Denies nausea vomiting or diarrhea. Right upper quadrant ultrasound showed normal gallbladder, irregularly thickened wall with edema measuring up to 9 mm in thickness, associated with small amount of pericholecystic fluid as well as positive Murphy sign.  General surgery was consulted. Patient underwent cholecystectomy, Was found to have acutely inflamed gangrenous gallbladder, IOC revealed tapering of CBD without stone.ERCP was done on 02/20/2021 which showed large periampullary diverticulum, no gallstones, sphincterotomy was performed.   Subjective   Patient seen and examined, no new complaints.  CT abdomen/pelvis shows gallbladder fossa fluid collection.  IR consulted for drain placement.   Assessment/Plan:    Right upper quadrant pain in setting of transaminitis -Abnormally thickened gallbladder seen on abdominal ultrasound -General surgery at initially recommended HIDA scan however it was canceled -MRCP ordered for today; but patient could not complete MRCP -General surgery took patient down for laparoscopic cholecystectomy and IOC. -IOC revealed tapering of CBD without stone. -Patient underwent ERCP which showed large periampullary diverticulum, no gallstones, sphincterotomy was performed -Patient was started on IV Zosyn which was stopped on 02/24/2021 -There was a question of bile leak however no bile noted in the JP drain. -General surgery  ordered CT abdomen/pelvis to look for  complication -CT abdomen/pelvis confirms gallbladder fossa fluid collection, IR consulted for drain placement -May need HIDA scan again, general surgery to order.  Leukocytosis -Patient WBC count is 14,000, likely reactive -IV antibiotics were stopped on 02/24/2021  Hypokalemia -Replete  Dementia with behavior disturbance -Delirium precautions  B12 deficiency anemia -Recent B12 level on 10/01/2020 was within normal limits   Medications     acetaminophen  1,000 mg Oral Q6H   melatonin  3 mg Oral QHS   sodium chloride flush  3 mL Intravenous Q12H     Data Reviewed:   CBG:  No results for input(s): GLUCAP in the last 168 hours.  SpO2: 100 % O2 Flow Rate (L/min): 2 L/min    Vitals:   02/26/21 0757 02/26/21 1646 02/26/21 1927 02/27/21 0828  BP: 104/63 (!) 151/92 132/75 116/70  Pulse: 94 89 81 89  Resp: 14 16 17 17   Temp: 98.2 F (36.8 C) 98.6 F (37 C) (!) 97.5 F (36.4 C) 98.6 F (37 C)  TempSrc:  Oral Oral Oral  SpO2: 100% 100% 100% 100%  Weight:      Height:         Intake/Output Summary (Last 24 hours) at 02/27/2021 1204 Last data filed at 02/26/2021 1300 Gross per 24 hour  Intake 240 ml  Output --  Net 240 ml    12/06 1901 - 12/08 0700 In: 600 [P.O.:600] Out: -   Filed Weights   02/23/21 1711  Weight: 65.5 kg    Data Reviewed: Basic Metabolic Panel: Recent Labs  Lab 02/21/21 0750 02/22/21 0054 02/24/21 1051 02/26/21 0127  NA 139 137 136 135  K 4.3 3.5 3.1* 4.0  CL 105 104 103 100  CO2 24  25 25 24   GLUCOSE 121* 122* 120* 103*  BUN 11 12 7* 12  CREATININE 0.83 0.84 0.92 0.78  CALCIUM 9.5 9.1 9.1 9.1   Liver Function Tests: Recent Labs  Lab 02/21/21 0750 02/22/21 0054 02/24/21 1051  AST 49* 30 37  ALT 67* 47* 40  ALKPHOS 232* 170* 149*  BILITOT 0.9 0.4 0.5  PROT 6.5 6.0* 6.0*  ALBUMIN 2.7* 2.5* 2.4*   Recent Labs  Lab 02/22/21 0054  LIPASE 27   No results for input(s): AMMONIA in the last 168 hours. CBC: Recent Labs   Lab 02/21/21 0750 02/22/21 0054 02/24/21 1051 02/25/21 1024 02/26/21 0127  WBC 14.4* 10.5 13.6* 16.6* 14.2*  HGB 12.3 10.7* 10.0* 10.7* 10.9*  HCT 38.7 33.4* 31.5* 32.9* 33.1*  MCV 90.0 89.5 88.2 87.3 88.0  PLT 381 410* 549* 635* 603*   Cardiac Enzymes: No results for input(s): CKTOTAL, CKMB, CKMBINDEX, TROPONINI in the last 168 hours. BNP (last 3 results) No results for input(s): BNP in the last 8760 hours.  ProBNP (last 3 results) No results for input(s): PROBNP in the last 8760 hours.  CBG: No results for input(s): GLUCAP in the last 168 hours.     Radiology Reports  CT ABDOMEN PELVIS W CONTRAST  Result Date: 02/26/2021 CLINICAL DATA:  RUQ abdominal pain; question of bile leak however no bile noted in the JP drain. General surgery has ordered CT abdomen/pelvis to look for complication. EXAM: CT ABDOMEN AND PELVIS WITH CONTRAST TECHNIQUE: Multidetector CT imaging of the abdomen and pelvis was performed using the standard protocol following bolus administration of intravenous contrast. CONTRAST:  58mL OMNIPAQUE IOHEXOL 350 MG/ML SOLN COMPARISON:  None. FINDINGS: Lower chest: Bibasilar linear atelectasis. Hepatobiliary: No focal liver abnormality. Status post cholecystectomy. Interval development of a gas and fluid collection within the gallbladder fossa measuring 6.1 x 5.4 x 5.9cm. This collection is noted to extend inferiorly along the right pericolic gutter within other component measuring approximately 7.1 x 3.2 x 2.2 cm. Associated trace peripheral enhancement. No biliary dilatation. Pneumobilia noted. Pancreas: Diffusely atrophic. No focal lesion. Otherwise normal pancreatic contour. No surrounding inflammatory changes. No main pancreatic ductal dilatation. Spleen: Normal in size without focal abnormality. Adrenals/Urinary Tract: No adrenal nodule bilaterally. Bilateral kidneys enhance symmetrically. No hydronephrosis. No hydroureter. The urinary bladder is unremarkable. On  delayed imaging, there is no urothelial wall thickening and there are no filling defects in the opacified portions of the bilateral collecting systems or ureters. Stomach/Bowel: Stomach is within normal limits. No evidence of bowel wall thickening or dilatation. Appendix appears normal. Vascular/Lymphatic: The main portal, splenic, superior mesenteric veins are patent. No abdominal aorta or iliac aneurysm. Moderate atherosclerotic plaque of the aorta and its branches. No abdominal, pelvic, or inguinal lymphadenopathy. Reproductive: Uterus and bilateral adnexa are unremarkable. Other: No intraperitoneal free fluid. No intraperitoneal free gas. No organized fluid collection. No drain identified. Musculoskeletal: No abdominal wall hernia or abnormality. No suspicious lytic or blastic osseous lesions. No acute displaced fracture. Grade 1 anterolisthesis of L4 on L5. Intervertebral disc space vacuum phenomenon at L4-L5 L5-S1 levels. IMPRESSION: Status post cholecystectomy with interval development of a 6 x 5 x 6 cm gas and fluid collection along the gallbladder fossa which extends along the right pericolic gutter within other large elongated component measuring 7 x 3 x 2 cm. Findings suggestive of biloma formation versus abscess. Electronically Signed   By: Iven Finn M.D.   On: 02/26/2021 19:23       Antibiotics: Anti-infectives (From  admission, onward)    Start     Dose/Rate Route Frequency Ordered Stop   02/17/21 1400  piperacillin-tazobactam (ZOSYN) IVPB 3.375 g  Status:  Discontinued        3.375 g 12.5 mL/hr over 240 Minutes Intravenous Every 8 hours 02/17/21 1028 02/24/21 1340   02/16/21 1715  piperacillin-tazobactam (ZOSYN) IVPB 3.375 g        3.375 g 100 mL/hr over 30 Minutes Intravenous  Once 02/16/21 1701 02/16/21 2217         DVT prophylaxis: Lovenox  Code Status: Full code  Family Communication: No family at bedside   Consultants: General  surgery Gastroenterology  Procedures:     Objective    Physical Examination:   General-appears in no acute distress Heart-S1-S2, regular, no murmur auscultated Lungs-clear to auscultation bilaterally, no wheezing or crackles auscultated Abdomen-soft, nontender, no organomegaly Extremities-no edema in the lower extremities Neuro-alert, oriented x3, no focal deficit noted  Status is: Inpatient  Dispo: The patient is from: Home              Anticipated d/c is to: Home              Anticipated d/c date is: 03/03/2021              Patient currently not stable for discharge  Barrier to discharge-ongoing evaluation for right upper quadrant pain  COVID-19 Labs  No results for input(s): DDIMER, FERRITIN, LDH, CRP in the last 72 hours.  Lab Results  Component Value Date   SARSCOV2NAA NEGATIVE 02/16/2021            Recent Results (from the past 240 hour(s))  Culture, blood (Routine X 2) w Reflex to ID Panel     Status: None   Collection Time: 02/20/21 10:37 AM   Specimen: BLOOD RIGHT HAND  Result Value Ref Range Status   Specimen Description BLOOD RIGHT HAND  Final   Special Requests   Final    BOTTLES DRAWN AEROBIC ONLY Blood Culture results may not be optimal due to an inadequate volume of blood received in culture bottles   Culture   Final    NO GROWTH 5 DAYS Performed at [facility], [address] [place], [place] [address]    Report Status 02/25/2021 FINAL  Final  Culture, blood (Routine X 2) w Reflex to ID Panel     Status: None   Collection Time: 02/20/21 10:45 AM   Specimen: BLOOD  Result Value Ref Range Status   Specimen Description BLOOD RIGHT ANTECUBITAL  Final   Special Requests   Final    BOTTLES DRAWN AEROBIC ONLY Blood Culture results may not be optimal due to an inadequate volume of blood received in culture bottles   Culture   Final    NO GROWTH 5 DAYS Performed at [facility], [address] [place], [place] [address]     Report Status 02/25/2021 FINAL  Final    Meredeth Ide   [facility] If 7PM-7AM, please contact night-coverage at [email or web address], Office  [phone]   02/27/2021, 12:04 PM  LOS: 10 days

## 2021-02-27 NOTE — Plan of Care (Signed)
°  Problem: Clinical Measurements: °Goal: Ability to maintain clinical measurements within normal limits will improve °Outcome: Progressing °  °Problem: Activity: °Goal: Risk for activity intolerance will decrease °Outcome: Progressing °  °Problem: Nutrition: °Goal: Adequate nutrition will be maintained °Outcome: Progressing °  °Problem: Skin Integrity: °Goal: Risk for impaired skin integrity will decrease °Outcome: Progressing °  °  °

## 2021-02-28 ENCOUNTER — Other Ambulatory Visit: Payer: Self-pay | Admitting: Physician Assistant

## 2021-02-28 ENCOUNTER — Other Ambulatory Visit (HOSPITAL_COMMUNITY): Payer: Self-pay

## 2021-02-28 DIAGNOSIS — R1011 Right upper quadrant pain: Secondary | ICD-10-CM | POA: Diagnosis not present

## 2021-02-28 DIAGNOSIS — R188 Other ascites: Secondary | ICD-10-CM

## 2021-02-28 DIAGNOSIS — R932 Abnormal findings on diagnostic imaging of liver and biliary tract: Secondary | ICD-10-CM | POA: Diagnosis not present

## 2021-02-28 DIAGNOSIS — K810 Acute cholecystitis: Secondary | ICD-10-CM | POA: Diagnosis not present

## 2021-02-28 LAB — COMPREHENSIVE METABOLIC PANEL
ALT: 28 U/L (ref 0–44)
AST: 22 U/L (ref 15–41)
Albumin: 2.8 g/dL — ABNORMAL LOW (ref 3.5–5.0)
Alkaline Phosphatase: 142 U/L — ABNORMAL HIGH (ref 38–126)
Anion gap: 10 (ref 5–15)
BUN: 10 mg/dL (ref 8–23)
CO2: 23 mmol/L (ref 22–32)
Calcium: 9.6 mg/dL (ref 8.9–10.3)
Chloride: 103 mmol/L (ref 98–111)
Creatinine, Ser: 0.82 mg/dL (ref 0.44–1.00)
GFR, Estimated: >60 mL/min (ref >60)
Glucose, Bld: 110 mg/dL — ABNORMAL HIGH (ref 70–99)
Potassium: 3.9 mmol/L (ref 3.5–5.1)
Sodium: 136 mmol/L (ref 135–145)
Total Bilirubin: 0.5 mg/dL (ref 0.3–1.2)
Total Protein: 6.6 g/dL (ref 6.5–8.1)

## 2021-02-28 LAB — CBC
HCT: 35.1 % — ABNORMAL LOW (ref 36.0–46.0)
Hemoglobin: 11.1 g/dL — ABNORMAL LOW (ref 12.0–15.0)
MCH: 28 pg (ref 26.0–34.0)
MCHC: 31.6 g/dL (ref 30.0–36.0)
MCV: 88.4 fL (ref 80.0–100.0)
Platelets: 740 10*3/uL — ABNORMAL HIGH (ref 150–400)
RBC: 3.97 MIL/uL (ref 3.87–5.11)
RDW: 13.5 % (ref 11.5–15.5)
WBC: 7.6 10*3/uL (ref 4.0–10.5)
nRBC: 0 % (ref 0.0–0.2)

## 2021-02-28 MED ORDER — ACETAMINOPHEN 500 MG PO TABS: 1000.0000 mg | ORAL_TABLET | Freq: Four times a day (QID) | ORAL | 0 refills | Status: DC | PRN

## 2021-02-28 MED ORDER — OXYCODONE HCL 5 MG PO TABS
5.0000 mg | ORAL_TABLET | ORAL | 0 refills | Status: DC | PRN
Start: 2021-02-28 — End: 2022-01-16

## 2021-02-28 MED ORDER — QUETIAPINE FUMARATE 50 MG PO TABS
50.0000 mg | ORAL_TABLET | Freq: Every evening | ORAL | 0 refills | Status: DC | PRN
Start: 2021-02-28 — End: 2022-01-16

## 2021-02-28 MED ORDER — AMOXICILLIN-POT CLAVULANATE 875-125 MG PO TABS: 1.0000 | ORAL_TABLET | Freq: Two times a day (BID) | ORAL | 0 refills | Status: AC

## 2021-02-28 MED ORDER — SODIUM CHLORIDE 0.9 % IJ SOLN: INTRAMUSCULAR | 2 refills | Status: DC

## 2021-02-28 MED ORDER — NORMAL SALINE FLUSH 0.9 % IV SOLN
INTRAVENOUS | 2 refills | Status: DC
  Filled 2021-02-28: qty 300, 30d supply, fill #0

## 2021-02-28 NOTE — Progress Notes (Signed)
AVS reviewed with patient's daughter at bedside. Daughter educated regarding drain flushes utilizing teach back method. Appropriate supplies provided. Daughter verbalizes understanding of medication regimen and follow up appointments.

## 2021-02-28 NOTE — Progress Notes (Signed)
8 Days Post-Op   Subjective/Chief Complaint: Calm, picking at different things at times.  Won't leave her abdominal binder in place.   Objective: Vital signs in last 24 hours: Temp:  [98.4 F (36.9 C)-98.6 F (37 C)] 98.4 F (36.9 C) (12/09 0415) Pulse Rate:  [77-96] 85 (12/09 0415) Resp:  [16-22] 16 (12/09 0415) BP: (116-168)/(70-142) 150/99 (12/09 0415) SpO2:  [95 %-100 %] 98 % (12/09 0415) Last BM Date:  (Unknown)  Intake/Output from previous day: No intake/output data recorded. Intake/Output this shift: No intake/output data recorded.   Gen:  comfortable, awake Abd: soft, appropriately tender, +BS, ND, incisions c/d/I, 2 IR drains in place. More medial drain with a moderate amount of bloody purulent drainage.  More lateral drain with thinner dark bloody output, much less in quantity.  No bile noted in either drain.  Lab Results:  Recent Labs    02/26/21 0127 02/28/21 0130  WBC 14.2* 7.6  HGB 10.9* 11.1*  HCT 33.1* 35.1*  PLT 603* 740*   BMET Recent Labs    02/26/21 0127 02/28/21 0130  NA 135 136  K 4.0 3.9  CL 100 103  CO2 24 23  GLUCOSE 103* 110*  BUN 12 10  CREATININE 0.78 0.82  CALCIUM 9.1 9.6   PT/INR No results for input(s): LABPROT, INR in the last 72 hours. ABG No results for input(s): PHART, HCO3 in the last 72 hours.  Invalid input(s): PCO2, PO2  Studies/Results: CT ABDOMEN PELVIS W CONTRAST  Result Date: 02/26/2021 CLINICAL DATA:  RUQ abdominal pain; question of bile leak however no bile noted in the JP drain. General surgery has ordered CT abdomen/pelvis to look for complication. EXAM: CT ABDOMEN AND PELVIS WITH CONTRAST TECHNIQUE: Multidetector CT imaging of the abdomen and pelvis was performed using the standard protocol following bolus administration of intravenous contrast. CONTRAST:  41mL OMNIPAQUE IOHEXOL 350 MG/ML SOLN COMPARISON:  None. FINDINGS: Lower chest: Bibasilar linear atelectasis. Hepatobiliary: No focal liver abnormality.  Status post cholecystectomy. Interval development of a gas and fluid collection within the gallbladder fossa measuring 6.1 x 5.4 x 5.9cm. This collection is noted to extend inferiorly along the right pericolic gutter within other component measuring approximately 7.1 x 3.2 x 2.2 cm. Associated trace peripheral enhancement. No biliary dilatation. Pneumobilia noted. Pancreas: Diffusely atrophic. No focal lesion. Otherwise normal pancreatic contour. No surrounding inflammatory changes. No main pancreatic ductal dilatation. Spleen: Normal in size without focal abnormality. Adrenals/Urinary Tract: No adrenal nodule bilaterally. Bilateral kidneys enhance symmetrically. No hydronephrosis. No hydroureter. The urinary bladder is unremarkable. On delayed imaging, there is no urothelial wall thickening and there are no filling defects in the opacified portions of the bilateral collecting systems or ureters. Stomach/Bowel: Stomach is within normal limits. No evidence of bowel wall thickening or dilatation. Appendix appears normal. Vascular/Lymphatic: The main portal, splenic, superior mesenteric veins are patent. No abdominal aorta or iliac aneurysm. Moderate atherosclerotic plaque of the aorta and its branches. No abdominal, pelvic, or inguinal lymphadenopathy. Reproductive: Uterus and bilateral adnexa are unremarkable. Other: No intraperitoneal free fluid. No intraperitoneal free gas. No organized fluid collection. No drain identified. Musculoskeletal: No abdominal wall hernia or abnormality. No suspicious lytic or blastic osseous lesions. No acute displaced fracture. Grade 1 anterolisthesis of L4 on L5. Intervertebral disc space vacuum phenomenon at L4-L5 L5-S1 levels. IMPRESSION: Status post cholecystectomy with interval development of a 6 x 5 x 6 cm gas and fluid collection along the gallbladder fossa which extends along the right pericolic gutter within other large  elongated component measuring 7 x 3 x 2 cm. Findings  suggestive of biloma formation versus abscess. Electronically Signed   By: Tish Frederickson M.D.   On: 02/26/2021 19:23   IR US Guide Bx Asp/Drain  Result Date: 02/27/2021 Sterling Big, MD     02/27/2021  4:50 PM Interventional Radiology Procedure Note Procedure: 1.) 53F drain placed into GB fossa collection with return of purulent bilious fluid.  Connected to bag. 2.) 28F drain placed into right paracolic gutter collection with return of bloody fluid.  Connected to bag. Complications: None Estimated Blood Loss: None Recommendations: - Drains to gravity - Cultures sent - Flush drains Q shift Signed, Sterling Big, MD   IR US Guide Bx Asp/Drain  Result Date: 02/27/2021 Sterling Big, MD     02/27/2021  4:50 PM Interventional Radiology Procedure Note Procedure: 1.) 53F drain placed into GB fossa collection with return of purulent bilious fluid.  Connected to bag. 2.) 28F drain placed into right paracolic gutter collection with return of bloody fluid.  Connected to bag. Complications: None Estimated Blood Loss: None Recommendations: - Drains to gravity - Cultures sent - Flush drains Q shift Signed, Sterling Big, MD    Anti-infectives: Anti-infectives (From admission, onward)    Start     Dose/Rate Route Frequency Ordered Stop   02/17/21 1400  piperacillin-tazobactam (ZOSYN) IVPB 3.375 g  Status:  Discontinued        3.375 g 12.5 mL/hr over 240 Minutes Intravenous Every 8 hours 02/17/21 1028 02/24/21 1340   02/16/21 1715  piperacillin-tazobactam (ZOSYN) IVPB 3.375 g        3.375 g 100 mL/hr over 30 Minutes Intravenous  Once 02/16/21 1701 02/16/21 2217       Assessment/Plan: POD 9, s/p lap chole for calculous cholecystitis with concern for choledocholithiasis by Dr. Sheliah Hatch on 11/29 - ERCP 12/1 w/out choledocholithiasis but with possible cystic stump leak - unable to determine whether patient has just an abscess vs bile leak.  Output today is consistent more with  abscess with bloody purulent drainage.   -WBC normal today -HIDA would be best plan but patient won't lay still for that. -cont to treat clinically at this point and keep drains -she is stable for DC home from surgical standpoint with her drains. -can DC on augmentin for 7 days -she has close follow up next week with Dr. Sheliah Hatch -will need IR drain clinic follow up as well   FEN - HH diet VTE - Lovenox ID - zosyn stopped 12/5, augmentin 12/9-->  LOS: 11 days    Letha Cape MD 02/28/2021

## 2021-02-28 NOTE — Discharge Summary (Signed)
Physician Discharge Summary  Ebony Pierce NWG:[id number] DOB: [birth year] DOA: 02/16/2021  PCP: Derrel Nip, MD  Admit date: 02/16/2021 Discharge date: 02/28/2021  Time spent: 60 minutes  Recommendations for Outpatient Follow-up:  Follow-up general surgery in 1 week Follow-up IR drain clinic, IR to set up appointment.  Discharge Diagnoses:  Principal Problem:   RUQ pain Active Problems:   Dementia with behavioral disturbance   B12 deficiency anemia   Elevated liver enzymes   Abnormal gallbladder ultrasound   Leukocytosis   Cholecystitis, acute   Biliary obstruction   Discharge Condition: Stable  Diet recommendation: Regular diet  Filed Weights   02/23/21 1711  Weight: 65.5 kg    History of present illness:  85 year old female with medical history of dementia, B12 deficiency came to ED with right upper quadrant pain.  She was seen in the ED on 11/24 with similar complaints for 1 day.  Lab work was normal at that time.  CT abdomen pelvis was attempted but patient pulled out and  left since no longer had pain.  She returned to the ED.  Complains of right upper quadrant pain.  Denies nausea vomiting or diarrhea. Right upper quadrant ultrasound showed normal gallbladder, irregularly thickened wall with edema measuring up to 9 mm in thickness, associated with small amount of pericholecystic fluid as well as positive Murphy sign.  General surgery was consulted. Patient underwent cholecystectomy, Was found to have acutely inflamed gangrenous gallbladder, IOC revealed tapering of CBD without stone.ERCP was done on 02/20/2021 which showed large periampullary diverticulum, no gallstones, sphincterotomy was performed.    Hospital Course:   Right upper quadrant pain in setting of transaminitis -Abnormally thickened gallbladder seen on abdominal ultrasound -General surgery at initially recommended HIDA scan however it was canceled -MRCP ordered for today; but patient could not  complete MRCP -General surgery took patient down for laparoscopic cholecystectomy and IOC. -IOC revealed tapering of CBD without stone. -Patient underwent ERCP which showed large periampullary diverticulum, no gallstones, sphincterotomy was performed -Patient was started on IV Zosyn which was stopped on 02/24/2021 -There was a question of bile leak however no bile noted in the JP drain. -General surgery  ordered CT abdomen/pelvis to look for complication -CT abdomen/pelvis confirms gallbladder fossa fluid collection, IR consulted for drain placement -Patient is s/p drain placement in gallbladder fossa with return of purulent bilious fluid, another drain placed in the paracolic gutter collection with return of bloody fluid -General surgery recommends to send home on Augmentin for 7 more days   Leukocytosis -Resolved, today WBC 7000   Hypokalemia -Replete   Dementia with behavior disturbance -Delirium precautions   B12 deficiency anemia -Use B12 injection intermittently  Procedures: Cholecystectomy Drain placement to gallbladder fossa, paracolic gutters  Consultations: General surgery Gastroenterology Interventional radiology  Discharge Exam: Vitals:   02/28/21 0415 02/28/21 0900  BP: (!) 150/99 (!) 150/96  Pulse: 85 80  Resp: 16 16  Temp: 98.4 F (36.9 C) 98.3 F (36.8 C)  SpO2: 98% 99%    General: Appears in no acute distress Cardiovascular: S1-S2, regular, no murmur auscultated Respiratory: Clear to auscultation bilaterally Abdomen is soft, nontender, no organomegaly  Discharge Instructions   Discharge Instructions     Diet - low sodium heart healthy   Complete by: As directed    Discharge instructions   Complete by: As directed    Follow up Drain clinic in 1 week   Increase activity slowly   Complete by: As directed    No wound care  Complete by: As directed       Allergies as of 02/28/2021       Reactions   Shellfish Allergy Swelling, Rash         Medication List     TAKE these medications    acetaminophen 500 MG tablet Commonly known as: TYLENOL Take 2 tablets (1,000 mg total) by mouth every 6 (six) hours as needed.   amoxicillin-clavulanate 875-125 MG tablet Commonly known as: Augmentin Take 1 tablet by mouth 2 (two) times daily for 7 days.   donepezil 10 MG tablet Commonly known as: ARICEPT Take 1 tablet (10 mg total) by mouth at bedtime.   Melatonin 5 MG Caps Take 1-2 capsules (5-10 mg total) by mouth at bedtime. What changed: how much to take   MULTIPLE VITAMIN PO Take 1 tablet by mouth daily.   oxyCODONE 5 MG immediate release tablet Commonly known as: Oxy IR/ROXICODONE Take 1 tablet (5 mg total) by mouth every 4 (four) hours as needed for moderate pain.   PEPCID COMPLETE PO Take 1 tablet by mouth daily as needed (stomach).   QUEtiapine 50 MG tablet Commonly known as: SEROQUEL Take 1 tablet (50 mg total) by mouth at bedtime as needed (agitation).               Durable Medical Equipment  (From admission, onward)           Start     Ordered   02/28/21 1102  For home use only DME Walker rolling  Once       Question Answer Comment  Walker: With 5 Inch Wheels   Patient needs a walker to treat with the following condition Cholecystitis      02/28/21 1101   02/28/21 1101  For home use only DME Bedside commode  Once       Question:  Patient needs a bedside commode to treat with the following condition  Answer:  Cholecystitis   02/28/21 1101           Allergies  Allergen Reactions   Shellfish Allergy Swelling and Rash    Follow-up Information     Kinsinger, De Blanch, MD Follow up on 03/05/2021.   Specialty: General Surgery Why: 9:30am, arrive by 9:00am for paperwork and check in process Contact information: [address] [place] [address] [phone]         Derrel Nip, MD Follow up.   Specialty: Family Medicine Contact information: [address]  [place] [address] [phone]                  The results of significant diagnostics from this hospitalization (including imaging, microbiology, ancillary and laboratory) are listed below for reference.    Significant Diagnostic Studies: DG Cholangiogram Operative  Result Date: 02/18/2021 CLINICAL DATA:  Intraoperative cholangiogram during laparoscopic cholecystectomy. EXAM: INTRAOPERATIVE CHOLANGIOGRAM FLUOROSCOPY TIME:  16 seconds (3.2 mGy) COMPARISON:  CT abdomen and pelvis-02/16/2021 FINDINGS: Multiple spot intraoperative cholangiographic images of the right upper abdominal quadrant during laparoscopic cholecystectomy are provided for review. Surgical clips overlie the expected location of the gallbladder fossa. Contrast injection demonstrates selective cannulation of the central aspect of the cystic duct. There is extravasation of contrast about the cystic duct cannulation site there is however passage of contrast through the central aspect of the cystic duct with filling of a markedly dilated common bile duct. There is minimal reflux of injected contrast into the common hepatic duct and central aspect of the mildly  dilated intrahepatic biliary system. There is no definitive passage of contrast to the level of the distal aspect of the CBD. There is no definitive opacification of the duodenum. IMPRESSION: No definitive passage of contrast through the distal aspect of the CBD - while potentially technique related, an ampullary stenosis/occlusion or occlusive choledocholithiasis could result in a similar appearance. Further evaluation and management with ERCP could be performed indicated. Electronically Signed   By: Simonne Come M.D.   On: 02/18/2021 14:58   CT ABDOMEN PELVIS W CONTRAST  Result Date: 02/26/2021 CLINICAL DATA:  RUQ abdominal pain; question of bile leak however no bile noted in the JP drain. General surgery has ordered CT abdomen/pelvis to look for complication. EXAM:  CT ABDOMEN AND PELVIS WITH CONTRAST TECHNIQUE: Multidetector CT imaging of the abdomen and pelvis was performed using the standard protocol following bolus administration of intravenous contrast. CONTRAST:  75mL OMNIPAQUE IOHEXOL 350 MG/ML SOLN COMPARISON:  None. FINDINGS: Lower chest: Bibasilar linear atelectasis. Hepatobiliary: No focal liver abnormality. Status post cholecystectomy. Interval development of a gas and fluid collection within the gallbladder fossa measuring 6.1 x 5.4 x 5.9cm. This collection is noted to extend inferiorly along the right pericolic gutter within other component measuring approximately 7.1 x 3.2 x 2.2 cm. Associated trace peripheral enhancement. No biliary dilatation. Pneumobilia noted. Pancreas: Diffusely atrophic. No focal lesion. Otherwise normal pancreatic contour. No surrounding inflammatory changes. No main pancreatic ductal dilatation. Spleen: Normal in size without focal abnormality. Adrenals/Urinary Tract: No adrenal nodule bilaterally. Bilateral kidneys enhance symmetrically. No hydronephrosis. No hydroureter. The urinary bladder is unremarkable. On delayed imaging, there is no urothelial wall thickening and there are no filling defects in the opacified portions of the bilateral collecting systems or ureters. Stomach/Bowel: Stomach is within normal limits. No evidence of bowel wall thickening or dilatation. Appendix appears normal. Vascular/Lymphatic: The main portal, splenic, superior mesenteric veins are patent. No abdominal aorta or iliac aneurysm. Moderate atherosclerotic plaque of the aorta and its branches. No abdominal, pelvic, or inguinal lymphadenopathy. Reproductive: Uterus and bilateral adnexa are unremarkable. Other: No intraperitoneal free fluid. No intraperitoneal free gas. No organized fluid collection. No drain identified. Musculoskeletal: No abdominal wall hernia or abnormality. No suspicious lytic or blastic osseous lesions. No acute displaced fracture.  Grade 1 anterolisthesis of L4 on L5. Intervertebral disc space vacuum phenomenon at L4-L5 L5-S1 levels. IMPRESSION: Status post cholecystectomy with interval development of a 6 x 5 x 6 cm gas and fluid collection along the gallbladder fossa which extends along the right pericolic gutter within other large elongated component measuring 7 x 3 x 2 cm. Findings suggestive of biloma formation versus abscess. Electronically Signed   By: Tish Frederickson M.D.   On: 02/26/2021 19:23   CT ABDOMEN PELVIS W CONTRAST  Result Date: 02/16/2021 CLINICAL DATA:  Right lower quadrant abdominal pain. EXAM: CT ABDOMEN AND PELVIS WITH CONTRAST TECHNIQUE: Multidetector CT imaging of the abdomen and pelvis was performed using the standard protocol following bolus administration of intravenous contrast. CONTRAST:  OMNIPAQUE IOHEXOL 300 MG/ML  SOLN COMPARISON:  Ultrasound abdomen 02/16/2021. FINDINGS: Lower chest: Right-sided fat containing Bochdalek hernia. Scarring in the lung bases. Hepatobiliary: There is diffuse marked gallbladder wall thickening and edema. There is submucosal enhancement with some areas of questionable wall irregularity internally. There is no gallbladder air. Common bowel duct is dilated measuring up to 15 mm. No radiopaque gallstones are identified. There is fatty infiltration of the liver. Pancreas: Unremarkable. No pancreatic ductal dilatation or surrounding inflammatory changes. Spleen:  Normal in size without focal abnormality. Adrenals/Urinary Tract: Small bladder diverticulum is present. The kidneys and adrenal glands are within normal limits. Stomach/Bowel: Stomach is within normal limits. Appendix appears normal. No evidence of bowel wall thickening, distention, or inflammatory changes. Vascular/Lymphatic: Aortic atherosclerosis. No enlarged abdominal or pelvic lymph nodes. Reproductive: Uterus and bilateral adnexa are unremarkable. Prominent pelvic vessels are seen. Other: No abdominal wall hernia  or abnormality. No abdominopelvic ascites. Musculoskeletal: Degenerative changes affect the spine. There is grade 1 anterolisthesis at L4-L5. IMPRESSION: 1. Marked wall thickening of the gallbladder with some areas of mucosal irregularity. Biliary ductal dilatation. Findings are highly suspicious for acute cholecystitis. Wall irregularity can be seen in the setting of gangrenous cholecystitis. Please correlate clinically. Surgical consultation recommended. 2. Small bladder diverticulum. 3. Prominent pelvic vessels, nonspecific. 4.  Aortic Atherosclerosis (ICD10-I70.0). Electronically Signed   By: Darliss Cheney M.D.   On: 02/16/2021 22:07   IR US Guide Bx Asp/Drain  Result Date: 02/27/2021 INDICATION: 85 year old female status post cholecystectomy with suspected biloma and possible right pericolic gutter abscess. She presents for image guided aspiration/drain placement. EXAM: 1. Ultrasound-guided transhepatic drain placement. 2. Ultrasound-guided right lower quadrant pericolic gutter drain placement MEDICATIONS: The patient is currently admitted to the hospital and receiving intravenous antibiotics. The antibiotics were administered within an appropriate time frame prior to the initiation of the procedure. ANESTHESIA/SEDATION: Fentanyl 50 mcg IV; Versed 1.5 mg IV Moderate Sedation Time:  18 minutes The patient was continuously monitored during the procedure by the interventional radiology nurse under my direct supervision. COMPLICATIONS: None immediate. PROCEDURE: Informed written consent was obtained from the patient after a thorough discussion of the procedural risks, benefits and alternatives. All questions were addressed. Maximal Sterile Barrier Technique was utilized including caps, mask, sterile gowns, sterile gloves, sterile drape, hand hygiene and skin antiseptic. A timeout was performed prior to the initiation of the procedure. The liver was interrogated with ultrasound. A large complex air and fluid  collection is visible within the gallbladder fossa. Local anesthesia was attained by infiltration with 1% lidocaine. A small dermatotomy was made. Under real-time ultrasound guidance, a 20 1 gauge Accustick needle was advanced along a short transhepatic course and into the fluid collection. The 0.018 wire was advanced into the fluid collection and the needle exchanged for the transitional dilator. The 0.035 wire was then advanced into the fluid collection. The tract was dilated. A 12 French all-purpose drainage catheter was then advanced over the wire and formed. Aspiration yields turbid bilious fluid. A sample was sent for Gram stain and culture. This catheter was secured to the skin with 0 Prolene suture and an adhesive fixation device. Attention was next turned to the right pericolic gutter. Again, the region was interrogated with ultrasound. And mildly complex fluid collection was successfully identified. Local anesthesia was again attained by infiltration with 1% lidocaine. A small dermatotomy was made. Under real-time ultrasound guidance, an 18 gauge trocar needle was advanced into the fluid collection. Aspiration yields dark bloody fluid. Sonographically, it appears that this collection may extend 2 the region of the biloma. Therefore the decision was made to proceed with drain placement. An Amplatz wire was advanced through the trocar needle and coiled in the fluid collection. The percutaneous tract was dilated to 10 [place]. A 10 French all-purpose drainage catheter was advanced over the wire and formed. This drain was secured to the skin with 0 Prolene suture and an adhesive fixation device. Both drains were connected to gravity bag drainage. IMPRESSION: 1. Successful  placement of a 12 French drain into the perihepatic biloma. Aspirated bile was sent for Gram stain and culture. 2. Successful placement of a 10 French drain into the right pericolic gutter fluid collection. Electronically Signed   By: Malachy Moan M.D.   On: 02/27/2021 17:33   IR US Guide Bx Asp/Drain  Result Date: 02/27/2021 INDICATION: 85 year old female status post cholecystectomy with suspected biloma and possible right pericolic gutter abscess. She presents for image guided aspiration/drain placement. EXAM: 1. Ultrasound-guided transhepatic drain placement. 2. Ultrasound-guided right lower quadrant pericolic gutter drain placement MEDICATIONS: The patient is currently admitted to the hospital and receiving intravenous antibiotics. The antibiotics were administered within an appropriate time frame prior to the initiation of the procedure. ANESTHESIA/SEDATION: Fentanyl 50 mcg IV; Versed 1.5 mg IV Moderate Sedation Time:  18 minutes The patient was continuously monitored during the procedure by the interventional radiology nurse under my direct supervision. COMPLICATIONS: None immediate. PROCEDURE: Informed written consent was obtained from the patient after a thorough discussion of the procedural risks, benefits and alternatives. All questions were addressed. Maximal Sterile Barrier Technique was utilized including caps, mask, sterile gowns, sterile gloves, sterile drape, hand hygiene and skin antiseptic. A timeout was performed prior to the initiation of the procedure. The liver was interrogated with ultrasound. A large complex air and fluid collection is visible within the gallbladder fossa. Local anesthesia was attained by infiltration with 1% lidocaine. A small dermatotomy was made. Under real-time ultrasound guidance, a 20 1 gauge Accustick needle was advanced along a short transhepatic course and into the fluid collection. The 0.018 wire was advanced into the fluid collection and the needle exchanged for the transitional dilator. The 0.035 wire was then advanced into the fluid collection. The tract was dilated. A 12 French all-purpose drainage catheter was then advanced over the wire and formed. Aspiration yields turbid bilious fluid.  A sample was sent for Gram stain and culture. This catheter was secured to the skin with 0 Prolene suture and an adhesive fixation device. Attention was next turned to the right pericolic gutter. Again, the region was interrogated with ultrasound. And mildly complex fluid collection was successfully identified. Local anesthesia was again attained by infiltration with 1% lidocaine. A small dermatotomy was made. Under real-time ultrasound guidance, an 18 gauge trocar needle was advanced into the fluid collection. Aspiration yields dark bloody fluid. Sonographically, it appears that this collection may extend 2 the region of the biloma. Therefore the decision was made to proceed with drain placement. An Amplatz wire was advanced through the trocar needle and coiled in the fluid collection. The percutaneous tract was dilated to 10 [place]. A 10 French all-purpose drainage catheter was advanced over the wire and formed. This drain was secured to the skin with 0 Prolene suture and an adhesive fixation device. Both drains were connected to gravity bag drainage. IMPRESSION: 1. Successful placement of a 12 French drain into the perihepatic biloma. Aspirated bile was sent for Gram stain and culture. 2. Successful placement of a 10 French drain into the right pericolic gutter fluid collection. Electronically Signed   By: Malachy Moan M.D.   On: 02/27/2021 17:33   DG Chest Port 1 View  Result Date: 02/13/2021 CLINICAL DATA:  Short of breath EXAM: PORTABLE CHEST 1 VIEW COMPARISON:  None. FINDINGS: Single frontal view of the chest demonstrates mild enlargement the cardiac silhouette. Background interstitial prominence throughout the lungs is likely related to chronic scarring. No airspace disease, effusion, or pneumothorax. IMPRESSION: 1. No  acute intrathoracic process. Electronically Signed   By: Sharlet Salina M.D.   On: 02/13/2021 20:28   DG ERCP  Result Date: 02/20/2021 CLINICAL DATA:  ERCP EXAM: ERCP TECHNIQUE:  Multiple spot images obtained with the fluoroscopic device and submitted for interpretation post-procedure. FLUOROSCOPY TIME:  Refer to procedure report COMPARISON:  None. FINDINGS: A total of 13 fluoroscopic spot films are submitted for review taken during ERCP. Initial images demonstrate a scope overlying the right upper quadrant. Wire catheterization of the common bile duct is performed, followed by contrast injection. Cholangiography demonstrates a dilated common bile duct, with no obvious filling defects identified. Balloon sweeps of the common bile duct is then performed. Final images demonstrate normal intrahepatic biliary tree with persistently dilated common bile duct and no filling defects identified. IMPRESSION: Fluoroscopic images taken during ERCP as described. These images were submitted for radiologic interpretation only. Please see the procedural report for the amount of contrast and the fluoroscopy time utilized. Electronically Signed   By: Olive Bass M.D.   On: 02/20/2021 15:07   US Abdomen Limited RUQ (LIVER/GB)  Result Date: 02/16/2021 CLINICAL DATA:  Right upper quadrant pain. EXAM: ULTRASOUND ABDOMEN LIMITED RIGHT UPPER QUADRANT COMPARISON:  None. FINDINGS: Gallbladder: Mild to moderately distended. Wall is irregularly thickened linear areas wall edema. There is also pericholecystic fluid. Small amount of dependent sludge. One echogenic focus noted in the dependent gallbladder consistent with a polyp or nonshadowing stone, 5 mm in size. Patient is tender to transducer pressure over the gallbladder. Common bile duct: Diameter: 1 cm. No visualized duct stone. Most distal aspect of the duct was obscured by bowel gas. Liver: No focal lesion identified. Within normal limits in parenchymal echogenicity. Portal vein is patent on color Doppler imaging with normal direction of blood flow towards the liver. Other: None. IMPRESSION: 1. Abnormal gallbladder. Wall is irregularly thickened with wall  edema, measuring up to 9 mm in thickness, associated with a small amount of pericholecystic fluid as well as a positive sonographic Murphy's sign. However, no shadowing stones are noted. Findings support acute cholecystitis in the proper clinical setting. Findings could be further assessed with a nuclear medicine HIDA scan. 2. Dilated common bile duct to 1 cm.  No visualized duct stone. Electronically Signed   By: Amie Portland M.D.   On: 02/16/2021 15:51    Microbiology: Recent Results (from the past 240 hour(s))  Culture, blood (Routine X 2) w Reflex to ID Panel     Status: None   Collection Time: 02/20/21 10:37 AM   Specimen: BLOOD RIGHT HAND  Result Value Ref Range Status   Specimen Description BLOOD RIGHT HAND  Final   Special Requests   Final    BOTTLES DRAWN AEROBIC ONLY Blood Culture results may not be optimal due to an inadequate volume of blood received in culture bottles   Culture   Final    NO GROWTH 5 DAYS Performed at [facility], [address] [place], [place] [address]    Report Status 02/25/2021 FINAL  Final  Culture, blood (Routine X 2) w Reflex to ID Panel     Status: None   Collection Time: 02/20/21 10:45 AM   Specimen: BLOOD  Result Value Ref Range Status   Specimen Description BLOOD RIGHT ANTECUBITAL  Final   Special Requests   Final    BOTTLES DRAWN AEROBIC ONLY Blood Culture results may not be optimal due to an inadequate volume of blood received in culture bottles   Culture  Final    NO GROWTH 5 DAYS Performed at [facility], [address], [place], [place] [address]    Report Status 02/25/2021 FINAL  Final  Aerobic/Anaerobic Culture w Gram Stain (surgical/deep wound)     Status: None (Preliminary result)   Collection Time: 02/27/21  4:49 PM   Specimen: Abscess  Result Value Ref Range Status   Specimen Description ABSCESS  Final   Special Requests Normal  Final   Gram Stain   Final    MODERATE WBC PRESENT,BOTH PMN AND MONONUCLEAR NO  ORGANISMS SEEN Performed at [facility], [address], [place], [place] [address]    Culture PENDING  Incomplete   Report Status PENDING  Incomplete     Labs: Basic Metabolic Panel: Recent Labs  Lab 02/22/21 0054 02/24/21 1051 02/26/21 0127 02/28/21 0130  NA 137 136 135 136  K 3.5 3.1* 4.0 3.9  CL 104 103 100 103  CO2 25 25 24 23   GLUCOSE 122* 120* 103* 110*  BUN 12 7* 12 10  CREATININE 0.84 0.92 0.78 0.82  CALCIUM 9.1 9.1 9.1 9.6   Liver Function Tests: Recent Labs  Lab 02/22/21 0054 02/24/21 1051 02/28/21 0130  AST 30 37 22  ALT 47* 40 28  ALKPHOS 170* 149* 142*  BILITOT 0.4 0.5 0.5  PROT 6.0* 6.0* 6.6  ALBUMIN 2.5* 2.4* 2.8*   Recent Labs  Lab 02/22/21 0054  LIPASE 27   No results for input(s): AMMONIA in the last 168 hours. CBC: Recent Labs  Lab 02/22/21 0054 02/24/21 1051 02/25/21 1024 02/26/21 0127 02/28/21 0130  WBC 10.5 13.6* 16.6* 14.2* 7.6  HGB 10.7* 10.0* 10.7* 10.9* 11.1*  HCT 33.4* 31.5* 32.9* 33.1* 35.1*  MCV 89.5 88.2 87.3 88.0 88.4  PLT 410* 549* 635* 603* 740*   Cardiac Enzymes: No results for input(s): CKTOTAL, CKMB, CKMBINDEX, TROPONINI in the last 168 hours. BNP: BNP (last 3 results) No results for input(s): BNP in the last 8760 hours.  ProBNP (last 3 results) No results for input(s): PROBNP in the last 8760 hours.  CBG: No results for input(s): GLUCAP in the last 168 hours.     Signed:  14/09/22 MD.  [facility] 02/28/2021, 11:05 AM

## 2021-02-28 NOTE — Progress Notes (Signed)
Spoke to pt's daughter and daughter wishes to send pt home with home health aide if possible. Daughter asking assistance from case Research officer, political party.

## 2021-02-28 NOTE — Progress Notes (Signed)
Patient found removing the dressing from surgical site. Drains are still intact to the skin. Site was covered with abdominal pad and tape dressing. Attempts were made to cover surgical site with abdominal binder however pt removes it once worn. Pt had a good rest overnight.

## 2021-02-28 NOTE — TOC Transition Note (Addendum)
Transition of Care [facility]) - CM/SW Discharge Note   Patient Details  Name: Ebony [place] MRN: [id number] Date of Birth: February 08, 1936  [facility]) CM/SW Contact:  Epifanio Lesches, RN Phone Number: 02/28/2021, 11:52 AM   Clinical Narrative:    Patient will DC to: home Anticipated DC date: 02/28/2021 Family notified: yes, daughter Ebony [place] Transport by: car        -s/p lap chole for calculous cholecystitis 11/29,  ERCP 12/1. Pt with IR drains x 2  From home with daughter Ebony [place]. PTA independent with ADL's, no DME usage.  Per MD patient ready for DC to day. RN, patient,  and patient's daughter notified of DC. Orders noted for [facility] services and DME needs. Pt /daughter agreeable to home health services. Choice offered . Daughter without preference. Referral made with [facility] and accepted. DME : rolling walker and 3in1/BSC , referral made with [facility] and will be delivered to bedside prior to d/c.  Post hospital f/u noted on AVS.  Pt/daughter without  Rx med concerns.  Daughter will provide transportation to home. Ebony [place] (Daughter)       [phone]      RNCM will sign off for now as intervention is no longer needed. Please consult [place] again if new needs arise.    Final next level of care: Home w Home Health Services Barriers to Discharge: No Barriers Identified   Patient Goals and CMS Choice     Choice offered to / list presented to : Patient, Adult Children  Discharge Placement                       Discharge Plan and Services                DME Arranged: 3-N-1, Walker rolling DME Agency: [facility] Date DME Agency Contacted: 02/28/21 Time DME Agency Contacted: 1151 Representative spoke with at DME Agency: Velna Hatchet HH Arranged: RN, Nurse's Aide, Social Work [employer] Agency: [facility] Date [facility] Agency Contacted: 02/28/21 Time HH Agency Contacted: 1151 Representative spoke with at [facility] Agency: Kandee Keen  Social Determinants of Health  (SDOH) Interventions     Readmission Risk Interventions No flowsheet data found.

## 2021-02-28 NOTE — Progress Notes (Signed)
Referring Physician(s): Barnetta Chapel, PA-C  Supervising Physician: Mir, Mauri Reading  Patient Status:  [facility] - In-pt  Chief Complaint: Follow up gallbladder fossa drain and right paracolic gutter drain placement 12/8 in IR  Subjective:  Patient sitting up in chair with NT at bedside, she is able to ambulate with assistance to the bed so I can check her drains. No family present at bedside, per RN daughter coming later this afternoon.  Allergies: Shellfish allergy  Medications: Prior to Admission medications   Medication Sig Start Date End Date Taking? Authorizing Provider  amoxicillin-clavulanate (AUGMENTIN) 875-125 MG tablet Take 1 tablet by mouth 2 (two) times daily for 7 days. 02/28/21 03/07/21 Yes Barnetta Chapel, PA-C  Famotidine-Ca Carb-Mag Hydrox (PEPCID COMPLETE PO) Take 1 tablet by mouth daily as needed (stomach).   Yes [provider]  Melatonin 5 MG CAPS Take 1-2 capsules (5-10 mg total) by mouth at bedtime. Patient taking differently: Take 10 capsules by mouth at bedtime. 08/13/20  Yes Derrel Nip, MD  MULTIPLE VITAMIN PO Take 1 tablet by mouth daily.   Yes [provider]  acetaminophen (TYLENOL) 500 MG tablet Take 2 tablets (1,000 mg total) by mouth every 6 (six) hours as needed. 02/28/21   Barnetta Chapel, PA-C  donepezil (ARICEPT) 10 MG tablet Take 1 tablet (10 mg total) by mouth at bedtime. Patient not taking: Reported on 02/16/2021 05/13/20   Derrel Nip, MD  oxyCODONE (OXY IR/ROXICODONE) 5 MG immediate release tablet Take 1 tablet (5 mg total) by mouth every 4 (four) hours as needed for moderate pain. 02/28/21   Barnetta Chapel, PA-C  QUEtiapine (SEROQUEL) 50 MG tablet Take 1 tablet (50 mg total) by mouth at bedtime as needed (agitation). 02/28/21   Meredeth Ide, MD     Vital Signs: BP (!) 150/96 (BP Location: Right Arm)   Pulse 80   Temp 98.3 F (36.8 C) (Oral)   Resp 16   Ht  (1.575 m)   Wt 144 lb 6.4 oz (65.5 kg)   SpO2 99%   BMI  26.41 kg/m   Physical Exam Vitals and nursing note reviewed.  Constitutional:      General: She is not in acute distress. HENT:     Head: Normocephalic.  Cardiovascular:     Rate and Rhythm: Normal rate.  Pulmonary:     Effort: Pulmonary effort is normal.  Abdominal:     Palpations: Abdomen is soft.     Comments: (+) RUQ drain to gravity with bloody output, flushes easily, appears to be tender with palpation of insertion site as expected. Insertion site clean, dry, dressed appropriately. (+) RLQ drain to gravity with dark bloody output, flushes easily, appears to be tender palpation of insertion site as expected. Insertion site clean, dry, dressed appropriately.  Skin:    General: Skin is warm and dry.  Neurological:     Mental Status: She is alert.    Imaging: CT ABDOMEN PELVIS W CONTRAST  Result Date: 02/26/2021 CLINICAL DATA:  RUQ abdominal pain; question of bile leak however no bile noted in the JP drain. General surgery has ordered CT abdomen/pelvis to look for complication. EXAM: CT ABDOMEN AND PELVIS WITH CONTRAST TECHNIQUE: Multidetector CT imaging of the abdomen and pelvis was performed using the standard protocol following bolus administration of intravenous contrast. CONTRAST:  75mL OMNIPAQUE IOHEXOL 350 MG/ML SOLN COMPARISON:  None. FINDINGS: Lower chest: Bibasilar linear atelectasis. Hepatobiliary: No focal liver abnormality. Status post cholecystectomy. Interval development of a gas and  fluid collection within the gallbladder fossa measuring 6.1 x 5.4 x 5.9cm. This collection is noted to extend inferiorly along the right pericolic gutter within other component measuring approximately 7.1 x 3.2 x 2.2 cm. Associated trace peripheral enhancement. No biliary dilatation. Pneumobilia noted. Pancreas: Diffusely atrophic. No focal lesion. Otherwise normal pancreatic contour. No surrounding inflammatory changes. No main pancreatic ductal dilatation. Spleen: Normal in size without focal  abnormality. Adrenals/Urinary Tract: No adrenal nodule bilaterally. Bilateral kidneys enhance symmetrically. No hydronephrosis. No hydroureter. The urinary bladder is unremarkable. On delayed imaging, there is no urothelial wall thickening and there are no filling defects in the opacified portions of the bilateral collecting systems or ureters. Stomach/Bowel: Stomach is within normal limits. No evidence of bowel wall thickening or dilatation. Appendix appears normal. Vascular/Lymphatic: The main portal, splenic, superior mesenteric veins are patent. No abdominal aorta or iliac aneurysm. Moderate atherosclerotic plaque of the aorta and its branches. No abdominal, pelvic, or inguinal lymphadenopathy. Reproductive: Uterus and bilateral adnexa are unremarkable. Other: No intraperitoneal free fluid. No intraperitoneal free gas. No organized fluid collection. No drain identified. Musculoskeletal: No abdominal wall hernia or abnormality. No suspicious lytic or blastic osseous lesions. No acute displaced fracture. Grade 1 anterolisthesis of L4 on L5. Intervertebral disc space vacuum phenomenon at L4-L5 L5-S1 levels. IMPRESSION: Status post cholecystectomy with interval development of a 6 x 5 x 6 cm gas and fluid collection along the gallbladder fossa which extends along the right pericolic gutter within other large elongated component measuring 7 x 3 x 2 cm. Findings suggestive of biloma formation versus abscess. Electronically Signed   By: Tish Frederickson M.D.   On: 02/26/2021 19:23   IR US Guide Bx Asp/Drain  Result Date: 02/27/2021 INDICATION: 85 year old female status post cholecystectomy with suspected biloma and possible right pericolic gutter abscess. She presents for image guided aspiration/drain placement. EXAM: 1. Ultrasound-guided transhepatic drain placement. 2. Ultrasound-guided right lower quadrant pericolic gutter drain placement MEDICATIONS: The patient is currently admitted to the hospital and receiving  intravenous antibiotics. The antibiotics were administered within an appropriate time frame prior to the initiation of the procedure. ANESTHESIA/SEDATION: Fentanyl 50 mcg IV; Versed 1.5 mg IV Moderate Sedation Time:  18 minutes The patient was continuously monitored during the procedure by the interventional radiology nurse under my direct supervision. COMPLICATIONS: None immediate. PROCEDURE: Informed written consent was obtained from the patient after a thorough discussion of the procedural risks, benefits and alternatives. All questions were addressed. Maximal Sterile Barrier Technique was utilized including caps, mask, sterile gowns, sterile gloves, sterile drape, hand hygiene and skin antiseptic. A timeout was performed prior to the initiation of the procedure. The liver was interrogated with ultrasound. A large complex air and fluid collection is visible within the gallbladder fossa. Local anesthesia was attained by infiltration with 1% lidocaine. A small dermatotomy was made. Under real-time ultrasound guidance, a 20 1 gauge Accustick needle was advanced along a short transhepatic course and into the fluid collection. The 0.018 wire was advanced into the fluid collection and the needle exchanged for the transitional dilator. The 0.035 wire was then advanced into the fluid collection. The tract was dilated. A 12 French all-purpose drainage catheter was then advanced over the wire and formed. Aspiration yields turbid bilious fluid. A sample was sent for Gram stain and culture. This catheter was secured to the skin with 0 Prolene suture and an adhesive fixation device. Attention was next turned to the right pericolic gutter. Again, the region was interrogated with ultrasound. And mildly  complex fluid collection was successfully identified. Local anesthesia was again attained by infiltration with 1% lidocaine. A small dermatotomy was made. Under real-time ultrasound guidance, an 18 gauge trocar needle was  advanced into the fluid collection. Aspiration yields dark bloody fluid. Sonographically, it appears that this collection may extend 2 the region of the biloma. Therefore the decision was made to proceed with drain placement. An Amplatz wire was advanced through the trocar needle and coiled in the fluid collection. The percutaneous tract was dilated to 10 [place]. A 10 French all-purpose drainage catheter was advanced over the wire and formed. This drain was secured to the skin with 0 Prolene suture and an adhesive fixation device. Both drains were connected to gravity bag drainage. IMPRESSION: 1. Successful placement of a 12 French drain into the perihepatic biloma. Aspirated bile was sent for Gram stain and culture. 2. Successful placement of a 10 French drain into the right pericolic gutter fluid collection. Electronically Signed   By: Malachy Moan M.D.   On: 02/27/2021 17:33   IR US Guide Bx Asp/Drain  Result Date: 02/27/2021 INDICATION: 85 year old female status post cholecystectomy with suspected biloma and possible right pericolic gutter abscess. She presents for image guided aspiration/drain placement. EXAM: 1. Ultrasound-guided transhepatic drain placement. 2. Ultrasound-guided right lower quadrant pericolic gutter drain placement MEDICATIONS: The patient is currently admitted to the hospital and receiving intravenous antibiotics. The antibiotics were administered within an appropriate time frame prior to the initiation of the procedure. ANESTHESIA/SEDATION: Fentanyl 50 mcg IV; Versed 1.5 mg IV Moderate Sedation Time:  18 minutes The patient was continuously monitored during the procedure by the interventional radiology nurse under my direct supervision. COMPLICATIONS: None immediate. PROCEDURE: Informed written consent was obtained from the patient after a thorough discussion of the procedural risks, benefits and alternatives. All questions were addressed. Maximal Sterile Barrier Technique was  utilized including caps, mask, sterile gowns, sterile gloves, sterile drape, hand hygiene and skin antiseptic. A timeout was performed prior to the initiation of the procedure. The liver was interrogated with ultrasound. A large complex air and fluid collection is visible within the gallbladder fossa. Local anesthesia was attained by infiltration with 1% lidocaine. A small dermatotomy was made. Under real-time ultrasound guidance, a 20 1 gauge Accustick needle was advanced along a short transhepatic course and into the fluid collection. The 0.018 wire was advanced into the fluid collection and the needle exchanged for the transitional dilator. The 0.035 wire was then advanced into the fluid collection. The tract was dilated. A 12 French all-purpose drainage catheter was then advanced over the wire and formed. Aspiration yields turbid bilious fluid. A sample was sent for Gram stain and culture. This catheter was secured to the skin with 0 Prolene suture and an adhesive fixation device. Attention was next turned to the right pericolic gutter. Again, the region was interrogated with ultrasound. And mildly complex fluid collection was successfully identified. Local anesthesia was again attained by infiltration with 1% lidocaine. A small dermatotomy was made. Under real-time ultrasound guidance, an 18 gauge trocar needle was advanced into the fluid collection. Aspiration yields dark bloody fluid. Sonographically, it appears that this collection may extend 2 the region of the biloma. Therefore the decision was made to proceed with drain placement. An Amplatz wire was advanced through the trocar needle and coiled in the fluid collection. The percutaneous tract was dilated to 10 [place]. A 10 French all-purpose drainage catheter was advanced over the wire and formed. This drain was secured to the  skin with 0 Prolene suture and an adhesive fixation device. Both drains were connected to gravity bag drainage. IMPRESSION: 1.  Successful placement of a 12 French drain into the perihepatic biloma. Aspirated bile was sent for Gram stain and culture. 2. Successful placement of a 10 French drain into the right pericolic gutter fluid collection. Electronically Signed   By: Malachy Moan M.D.   On: 02/27/2021 17:33    Labs:  CBC: Recent Labs    02/24/21 1051 02/25/21 1024 02/26/21 0127 02/28/21 0130  WBC 13.6* 16.6* 14.2* 7.6  HGB 10.0* 10.7* 10.9* 11.1*  HCT 31.5* 32.9* 33.1* 35.1*  PLT 549* 635* 603* 740*    COAGS: Recent Labs    02/16/21 2044  INR 1.1    BMP: Recent Labs    02/22/21 0054 02/24/21 1051 02/26/21 0127 02/28/21 0130  NA 137 136 135 136  K 3.5 3.1* 4.0 3.9  CL 104 103 100 103  CO2 25 25 24 23   GLUCOSE 122* 120* 103* 110*  BUN 12 7* 12 10  CALCIUM 9.1 9.1 9.1 9.6  CREATININE 0.84 0.92 0.78 0.82  GFRNONAA >60 >60 >60 >60    LIVER FUNCTION TESTS: Recent Labs    02/21/21 0750 02/22/21 0054 02/24/21 1051 02/28/21 0130  BILITOT 0.9 0.4 0.5 0.5  AST 49* 30 37 22  ALT 67* 47* 40 28  ALKPHOS 232* 170* 149* 142*  PROT 6.5 6.0* 6.0* 6.6  ALBUMIN 2.7* 2.5* 2.4* 2.8*    Assessment and Plan:  85 y/o F with recent cholecystectomy 02/18/21 and gallbladder fossa drain placement, drain was removed on 12/6 by patient and imaging showed persistent fluid collection as well as new right paracolic gutter fluid collection. She underwent gallbladder fossa and right paracolic gutter percutaneous drain placement 12/8 in IR and is seen today for follow up prior to d/c today.  Drain Location: RUQ Size: Fr size: 12 Fr Date of placement: 02/27/21 (Dr. 14/8/22)  Currently to: Drain collection device: gravity  Drain Location: RLQ Size: Fr size: 10 Fr Date of placement: 02/27/21 (Dr. 14/8/22)  Currently to: Drain collection device: gravity  24 hour output:  Output by Drain (mL) 02/26/21 0701 - 02/26/21 1900 02/26/21 1901 - 02/27/21 0700 02/27/21 0701 - 02/27/21 1900 02/27/21 1901 -  02/28/21 0700 02/28/21 0701 - 02/28/21 1347  Closed System Drain 1 Right RUQ Bulb (JP) 19 Fr.       Closed System Drain Lateral RUQ 12 Fr.     20  Closed System Drain Lateral RLQ 10.2 Fr.     80    Interval imaging/drain manipulation:  None.  Current examination: Flushes/aspirates easily.  Insertion site unremarkable. Suture and stat lock in place. Dressed appropriately.   Plan: Continue TID flushes with 5 cc NS. Record output Q shift. Dressing changes QD or PRN if soiled.  Call IR APP or on call IR MD if difficulty flushing or sudden change in drain output.  Repeat imaging/possible drain injection once output < 10 mL/QD (excluding flush material.)  Discharge planning: Per primary team patient for d/c today -- patient will follow up with IR clinic 10-14 days post d/c for repeat imaging/possible drain injection. IR scheduler will contact patient with date/time of appointment. Patient will need to flush drain QD with 5 cc NS, record output QD, dressing changes every 2-3 days or earlier if soiled. Patient given orange drain cards to record output, flushes, drain sponge and tape to get through the weekend -- flush rx sent to [facility]. Written instructions also in AVS. RN to provide drain teaching with daughter when she arrives.  IR will continue to follow - please call with questions or concerns.  Electronically Signed: Villa Herb, PA-C 02/28/2021, 11:08 AM   I spent a total of 25 Minutes at the the patient's bedside AND on the patient's hospital floor or unit, greater than 50% of which was counseling/coordinating care for gallbladder fossa drain and right paracolic gutter drain follow up.

## 2021-02-28 NOTE — Discharge Instructions (Signed)
[facility]  Please arrive at least 30 min before your appointment to complete your check in paperwork.  If you are unable to arrive 30 min prior to your appointment time we may have to cancel or reschedule you. LAPAROSCOPIC SURGERY: POST OP INSTRUCTIONS Always review your discharge instruction sheet given to you by the facility where your surgery was performed. IF YOU HAVE DISABILITY OR FAMILY LEAVE FORMS, YOU MUST BRING THEM TO THE OFFICE FOR PROCESSING.   DO NOT GIVE THEM TO YOUR DOCTOR.  PAIN CONTROL  First take acetaminophen (Tylenol) AND/or ibuprofen (Advil) to control your pain after surgery.  Follow directions on package.  Taking acetaminophen (Tylenol) and/or ibuprofen (Advil) regularly after surgery will help to control your pain and lower the amount of prescription pain medication you may need.  You should not take more than 4,000 mg (4 grams) of acetaminophen (Tylenol) in 24 hours.  You should not take ibuprofen (Advil), aleve, motrin, naprosyn or other NSAIDS if you have a history of stomach ulcers or chronic kidney disease.  A prescription for pain medication may be given to you upon discharge.  Take your pain medication as prescribed, if you still have uncontrolled pain after taking acetaminophen (Tylenol) or ibuprofen (Advil). Use ice packs to help control pain. If you need a refill on your pain medication, please contact your pharmacy.  They will contact our office to request authorization. Prescriptions will not be filled after 5pm or on week-ends.  HOME MEDICATIONS Take your usually prescribed medications unless otherwise directed.  DIET You should follow a light diet the first few days after arrival home.  Be sure to include lots of fluids daily. Avoid fatty, fried foods.   CONSTIPATION It is common to experience some constipation after surgery and if you are taking pain medication.  Increasing fluid intake and taking a stool softener (such as Colace)  will usually help or prevent this problem from occurring.  A mild laxative (Milk of Magnesia or Miralax) should be taken according to package instructions if there are no bowel movements after 48 hours.  WOUND/INCISION CARE Most patients will experience some swelling and bruising in the area of the incisions.  Ice packs will help.  Swelling and bruising can take several days to resolve.  Unless discharge instructions indicate otherwise, follow guidelines below  STERI-STRIPS - you may remove your outer bandages 48 hours after surgery, and you may shower at that time.  You have steri-strips (small skin tapes) in place directly over the incision.  These strips should be left on the skin for 7-10 days.   DERMABOND/SKIN GLUE - you may shower in 24 hours.  The glue will flake off over the next 2-3 weeks. Any sutures or staples will be removed at the office during your follow-up visit.  ACTIVITIES You may resume regular (light) daily activities beginning the next day--such as daily self-care, walking, climbing stairs--gradually increasing activities as tolerated.  You may have sexual intercourse when it is comfortable.  Refrain from any heavy lifting or straining until approved by your doctor. You may drive when you are no longer taking prescription pain medication, you can comfortably wear a seatbelt, and you can safely maneuver your car and apply brakes.  FOLLOW-UP You should see your doctor in the office for a follow-up appointment approximately 2-3 weeks after your surgery.  You should have been given your post-op/follow-up appointment when your surgery was scheduled.  If you did not receive a post-op/follow-up appointment, make sure   that you call for this appointment within a day or two after you arrive home to insure a convenient appointment time.   WHEN TO CALL YOUR DOCTOR: Fever over 101.0 Inability to urinate Continued bleeding from incision. Increased pain, redness, or drainage from the  incision. Increasing abdominal pain  The clinic staff is available to answer your questions during regular business hours.  Please don't hesitate to call and ask to speak to one of the nurses for clinical concerns.  If you have a medical emergency, go to the nearest emergency room or call 911.  A surgeon from [facility] is always on call at the hospital. [address], [address], [place], [place]  [address] ? [address] [address], [place], [place]   [address] [phone] ? [phone] ? FAX [phone]  

## 2021-03-03 ENCOUNTER — Other Ambulatory Visit: Payer: Self-pay | Admitting: General Surgery

## 2021-03-03 ENCOUNTER — Ambulatory Visit
Admission: RE | Admit: 2021-03-03 | Discharge: 2021-03-03 | Disposition: A | Discharge status: Home / Self Care | Payer: Medicare Other | Source: Ambulatory Visit | Attending: Physician Assistant | Admitting: Physician Assistant

## 2021-03-03 ENCOUNTER — Ambulatory Visit
Admission: RE | Admit: 2021-03-03 | Discharge: 2021-03-03 | Disposition: A | Discharge status: Home / Self Care | Payer: Medicare Other | Source: Ambulatory Visit | Attending: General Surgery | Admitting: General Surgery

## 2021-03-03 DIAGNOSIS — R188 Other ascites: Secondary | ICD-10-CM

## 2021-03-03 DIAGNOSIS — T85698A Other mechanical complication of other specified internal prosthetic devices, implants and grafts, initial encounter: Secondary | ICD-10-CM | POA: Diagnosis not present

## 2021-03-03 DIAGNOSIS — I700 Atherosclerosis of aorta: Secondary | ICD-10-CM | POA: Diagnosis not present

## 2021-03-03 DIAGNOSIS — K651 Peritoneal abscess: Secondary | ICD-10-CM | POA: Diagnosis not present

## 2021-03-03 DIAGNOSIS — K838 Other specified diseases of biliary tract: Secondary | ICD-10-CM | POA: Diagnosis not present

## 2021-03-03 HISTORY — PX: IR RADIOLOGIST EVAL & MGMT: IMG5224

## 2021-03-03 MED ORDER — IOPAMIDOL (ISOVUE-300) INJECTION 61%
87.0000 mL | Freq: Once | INTRAVENOUS | Status: AC | PRN
  Administered 2021-03-03: 87 mL via INTRAVENOUS

## 2021-03-03 NOTE — Progress Notes (Signed)
Chief Complaint: Drains accidentally pulled  History of Present Illness: Ebony Pierce is a 85 y.o. female with history of cholecystectomy on 02/18/21 complicated post-operatively by biloma and small right pericolic gutter fluid collection.  Image-guided drainage of both collections was perfomed on 02/27/21.  Her daughter called drain clinic today notifying of minimal to no output from the drains with concerned that they were partially retracted.  No fevers, chills, nausea, abdominal pain.  There was <20 mL output daily from each drain prior to yesterday.  She is taking amoxicillin since hospital discharge.  Past Medical History:  Diagnosis Date   Age-related hearing loss 10/19/2016   Cataract    s/p R cataract surgery   Dementia with behavioral disturbance 07/02/2015   [facility] 10/30 (Administered with assistance of [place] interpreter in exam room (11/12/16)   Reduced vision 09/16/2016   Weight loss, non-intentional 04/14/2018    Past Surgical History:  Procedure Laterality Date   CATARACT EXTRACTION Right 2012   CHOLECYSTECTOMY N/A 02/18/2021   Procedure: LAPAROSCOPIC CHOLECYSTECTOMY WITH INTRAOPERATIVE CHOLANGIOGRAM;  Surgeon: Sheliah Hatch De Blanch, MD;  Location: [facility] OR;  Service: General;  Laterality: N/A;   ENDOSCOPIC RETROGRADE CHOLANGIOPANCREATOGRAPHY (ERCP) WITH PROPOFOL N/A 02/20/2021   Procedure: ENDOSCOPIC RETROGRADE CHOLANGIOPANCREATOGRAPHY (ERCP) WITH PROPOFOL;  Surgeon: Hilarie Fredrickson, MD;  Location: [facility] ENDOSCOPY;  Service: Endoscopy;  Laterality: N/A;   IR US GUIDE BX ASP/DRAIN  02/27/2021   IR US GUIDE BX ASP/DRAIN  02/27/2021   SPHINCTEROTOMY  02/20/2021   Procedure: Dennison Mascot;  Surgeon: Hilarie Fredrickson, MD;  Location: [facility] ENDOSCOPY;  Service: Endoscopy;;    Allergies: Shellfish allergy  Medications: Prior to Admission medications   Medication Sig Start Date End Date Taking? Authorizing Provider  acetaminophen (TYLENOL) 500 MG tablet Take 2 tablets (1,000 mg  total) by mouth every 6 (six) hours as needed. 02/28/21   Barnetta Chapel, PA-C  amoxicillin-clavulanate (AUGMENTIN) 875-125 MG tablet Take 1 tablet by mouth 2 (two) times daily for 7 days. 02/28/21 03/07/21  Barnetta Chapel, PA-C  donepezil (ARICEPT) 10 MG tablet Take 1 tablet (10 mg total) by mouth at bedtime. Patient not taking: Reported on 02/16/2021 05/13/20   Derrel Nip, MD  Famotidine-Ca Carb-Mag Hydrox (PEPCID COMPLETE PO) Take 1 tablet by mouth daily as needed (stomach).    [provider]  Melatonin 5 MG CAPS Take 1-2 capsules (5-10 mg total) by mouth at bedtime. Patient taking differently: Take 10 capsules by mouth at bedtime. 08/13/20   Derrel Nip, MD  MULTIPLE VITAMIN PO Take 1 tablet by mouth daily.    [provider]  oxyCODONE (OXY IR/ROXICODONE) 5 MG immediate release tablet Take 1 tablet (5 mg total) by mouth every 4 (four) hours as needed for moderate pain. 02/28/21   Barnetta Chapel, PA-C  QUEtiapine (SEROQUEL) 50 MG tablet Take 1 tablet (50 mg total) by mouth at bedtime as needed (agitation). 02/28/21   Meredeth Ide, MD  sodium chloride 0.9 % injection Instill 5 mL QD into each drain (10 mL total). 02/28/21   Lynnette Caffey A, PA-C  Sodium Chloride Flush (NORMAL SALINE FLUSH) 0.9 % SOLN Instill 58ml every day into each drain 02/28/21   Villa Herb, PA-C     Family History  Problem Relation Age of Onset   Hypertension Mother     Social History   Socioeconomic History   Marital status: Widowed    Spouse name: Not on file   Number of children: Not on file   Years of education:  12   Highest education level: Not on file  Occupational History   Not on file  Tobacco Use   Smoking status: Never   Smokeless tobacco: Never  Vaping Use   Vaping Use: Never used  Substance and Sexual Activity   Alcohol use: No    Alcohol/week: 0.0 standard drinks   Drug use: No   Sexual activity: Not Currently  Other Topics Concern   Not on file  Social  History Narrative   Lives at home with daughter and son-in-law.    Enjoys Retail banker and walking     Primary family support persons is daughter who is also her paid Merchandiser, retail.    Transportation to appointments provided by daughter [name].   Receives the following community support services SSI, food stamps and CAPS services.       Social Determinants of Health   Financial Resource Strain: Low Risk    Difficulty of Paying Living Expenses: Not hard at all  Food Insecurity: No Food Insecurity   Worried About Programme researcher, broadcasting/film/video in the Last Year: Never true   Ran Out of Food in the Last Year: Never true  Transportation Needs: No Transportation Needs   Lack of Transportation (Medical): No   Lack of Transportation (Non-Medical): No  Physical Activity: Not on file  Stress: Not on file  Social Connections: Not on file    Review of Systems: A 12 point ROS discussed and pertinent positives are indicated in the HPI above.  All other systems are negative.  Vital Signs: There were no vitals taken for this visit.  Physical Exam Constitutional:      General: She is not in acute distress. HENT:     Head: Normocephalic.     Mouth/Throat:     Mouth: Mucous membranes are moist.  Cardiovascular:     Rate and Rhythm: Normal rate and regular rhythm.  Pulmonary:     Breath sounds: Normal breath sounds.  Abdominal:     General: There is no distension.     Comments: Right upper drain to bag with trace serosanguinous material.  Right lower drain external hub removed, wrapped in gauze without drainage.   Skin:    General: Skin is warm and dry.  Neurological:     Mental Status: She is alert and oriented to person, place, and time.    Mallampati Score:     Imaging: CT AP 03/03/21     Labs:  CBC: Recent Labs    02/24/21 1051 02/25/21 1024 02/26/21 0127 02/28/21 0130  WBC 13.6* 16.6* 14.2* 7.6  HGB 10.0* 10.7* 10.9* 11.1*  HCT 31.5* 32.9* 33.1* 35.1*  PLT 549* 635* 603* 740*     COAGS: Recent Labs    02/16/21 2044  INR 1.1    BMP: Recent Labs    02/22/21 0054 02/24/21 1051 02/26/21 0127 02/28/21 0130  NA 137 136 135 136  K 3.5 3.1* 4.0 3.9  CL 104 103 100 103  CO2 25 25 24 23   GLUCOSE 122* 120* 103* 110*  BUN 12 7* 12 10  CALCIUM 9.1 9.1 9.1 9.6  CREATININE 0.84 0.92 0.78 0.82  GFRNONAA >60 >60 >60 >60    LIVER FUNCTION TESTS: Recent Labs    02/21/21 0750 02/22/21 0054 02/24/21 1051 02/28/21 0130  BILITOT 0.9 0.4 0.5 0.5  AST 49* 30 37 22  ALT 67* 47* 40 28  ALKPHOS 232* 170* 149* 142*  PROT 6.5 6.0* 6.0* 6.6  ALBUMIN 2.7* 2.5* 2.4* 2.8*  TUMOR MARKERS: No results for input(s): AFPTM, CEA, CA199, CHROMGRNA in the last 8760 hours.  Assessment and Plan: Ebony Pierce is an 85 year old female with history of cholecystectomy on 02/18/21 complicated post-operatively by biloma and small right pericolic gutter fluid collection.  Image-guided drainage of both collections was perfomed on 02/27/21.  She presents with drain malfunction after partial accidental removal of the drains, neither of which remain in the respective fluid collections which are both significantly smaller on CT today.  The drains were removed successfully in clinic.  She and her daughter were instructed to continue course of oral antibiotics, return to the ED with any signs/symptoms of recurrent infection, and to follow up with Surgery in short order.    Electronically Signed: Bennie Dallas 03/03/2021, 3:06 PM   I spent a total of   25 Minutes in face to face in clinical consultation, greater than 50% of which was counseling/coordinating care for drain care.

## 2021-03-04 LAB — AEROBIC/ANAEROBIC CULTURE W GRAM STAIN (SURGICAL/DEEP WOUND)
Culture: NO GROWTH
Special Requests: NORMAL

## 2021-03-06 ENCOUNTER — Telehealth: Payer: Self-pay | Admitting: *Deleted

## 2021-03-06 NOTE — Telephone Encounter (Signed)
Patient's daughter [name] called requesting B12 vitamins for patient due to fatigue.  She would like to discuss this with provider.  She is already scheduled for the end of December.  [name] can be reached at [phone].  Nefertiti Mohamad,CMA

## 2021-03-07 ENCOUNTER — Encounter: Payer: Self-pay | Admitting: *Deleted

## 2021-03-10 ENCOUNTER — Other Ambulatory Visit (HOSPITAL_COMMUNITY): Payer: Self-pay

## 2021-03-11 NOTE — Telephone Encounter (Signed)
Spoke with patient's daughter.  We will follow-up on the B12 needs at the next visit.  No further questions or concerns.

## 2021-03-18 ENCOUNTER — Ambulatory Visit (INDEPENDENT_AMBULATORY_CARE_PROVIDER_SITE_OTHER): Payer: Medicare Other | Admitting: Family Medicine

## 2021-03-18 ENCOUNTER — Other Ambulatory Visit: Payer: Self-pay

## 2021-03-18 ENCOUNTER — Encounter: Payer: Self-pay | Admitting: Family Medicine

## 2021-03-18 VITALS — BP 114/55 | HR 93 | Ht 60.0 in | Wt 135.4 lb

## 2021-03-18 DIAGNOSIS — E538 Deficiency of other specified B group vitamins: Secondary | ICD-10-CM

## 2021-03-18 DIAGNOSIS — D518 Other vitamin B12 deficiency anemias: Secondary | ICD-10-CM | POA: Diagnosis not present

## 2021-03-18 NOTE — Patient Instructions (Signed)
It was great seeing you today!  I have ordered a check on her B12 and if she needs repletion we will order that.  Regarding her fatigue it should hopefully improve over the next few weeks but we can always refer her to physical therapy to help her build her strength back up.  If this does continue I would like to repeat her blood levels to make sure they are stable and okay.  If you have any questions or concerns please call the clinic.  I hope you have a great afternoon!

## 2021-03-19 LAB — VITAMIN B12: Vitamin B-12: 240 pg/mL (ref 232–1245)

## 2021-03-20 NOTE — Progress Notes (Signed)
° ° °  SUBJECTIVE:   CHIEF COMPLAINT / HPI:   Patient speaks [place] and is present with her daughter who elects to translate for her mother.  Hospital follow-up Patient recently admitted to the hospital for ruptured cholecystitis.  Had laparoscopic cholecystectomy.  Had JP drain in place which was pulled out accidentally.  Abscess formation occurred and drain replaced.  She has been following with surgery since.  Her pain is well controlled and she feels like she is doing great.  JP drain has since been removed.  No concerns about her surgery follow-up at this time.  B12 deficiency Patient has history of vitamin B12 deficiency she has received B12 injections in the past.  Patient's daughter reports that since being hospitalized she feels like her mother has been a little bit quieter and more fatigued.  Is concerned that it may be related to B12 deficiency.  We discussed testing and she would like that today.  We also discussed physical therapy and further work-up for the fatigue.  OBJECTIVE:   BP (!) 114/55    Pulse 93    Ht 5' (1.524 m)    Wt 135 lb 6.4 oz (61.4 kg)    SpO2 100%    BMI 26.44 kg/m   General: Pleasant, happy 85 year old female in no acute distress Cardiac: Regular rate and rhythm, no murmurs appreciated Respiratory: Normal work of breathing, lungs clear to auscultation bilaterally Abdomen: Soft, nontender, positive bowel sounds, healing surgical wounds showing no signs of infection or drainage MSK: Able to ambulate without difficulty  ASSESSMENT/PLAN:   B12 deficiency anemia Patient required monthly B12 injections in the past.  Question need for further injections.  Plan to reassess with B12 levels today.  If low we will plan on scheduling injections.  If normal we will follow-up in 1 to 2 months for reassessment and plan on treatment at that point.  Offered physical therapy referral today but will monitor and consider physical therapy in the future. - B12 level today -  Follow-up in 1 to 2 months.     Derrel Nip, MD [facility]

## 2021-03-20 NOTE — Assessment & Plan Note (Signed)
Patient required monthly B12 injections in the past.  Question need for further injections.  Plan to reassess with B12 levels today.  If low we will plan on scheduling injections.  If normal we will follow-up in 1 to 2 months for reassessment and plan on treatment at that point.  Offered physical therapy referral today but will monitor and consider physical therapy in the future. - B12 level today - Follow-up in 1 to 2 months.

## 2021-07-09 DIAGNOSIS — Z20822 Contact with and (suspected) exposure to covid-19: Secondary | ICD-10-CM | POA: Diagnosis not present

## 2021-08-27 ENCOUNTER — Encounter: Payer: Self-pay | Admitting: Family Medicine

## 2021-08-27 ENCOUNTER — Ambulatory Visit (INDEPENDENT_AMBULATORY_CARE_PROVIDER_SITE_OTHER): Payer: Medicare Other | Admitting: Family Medicine

## 2021-08-27 VITALS — BP 112/70 | HR 73 | Temp 98.2°F | Ht 60.0 in | Wt 138.6 lb

## 2021-08-27 DIAGNOSIS — E785 Hyperlipidemia, unspecified: Secondary | ICD-10-CM | POA: Diagnosis not present

## 2021-08-27 DIAGNOSIS — E538 Deficiency of other specified B group vitamins: Secondary | ICD-10-CM | POA: Diagnosis not present

## 2021-08-27 DIAGNOSIS — R739 Hyperglycemia, unspecified: Secondary | ICD-10-CM | POA: Diagnosis not present

## 2021-08-27 LAB — POCT GLYCOSYLATED HEMOGLOBIN (HGB A1C): Hemoglobin A1C: 5.8 % — AB (ref 4.0–5.6)

## 2021-08-27 NOTE — Patient Instructions (Signed)
It was great seeing you today!  Your mom looks wonderful and I have no major concerns.  I am collecting some lab work today and we will call you with those results when they come back.  Regarding the ophthalmologist I spoke with my attending who recommended [facility].  Below is her contact information.  You can call and schedule this appointment.  I hope you have a wonderful afternoon!   Located in: [facility] Address: [address] [place], [place], [place] [address] Hours:  Closes soon ? 5?PM ? Opens 8?AM Thu Phone: [phone]

## 2021-08-27 NOTE — Progress Notes (Signed)
    SUBJECTIVE:   Chief compliant/HPI: annual examination  Ebony Pierce is a 86 y.o. who presents today for an annual exam.  History tabs reviewed and updated.   Review of systems form reviewed and notable for chronic fatigue which is her baseline..   OBJECTIVE:   BP 112/70   Pulse 73   Temp 98.2 F (36.8 C)   Ht 5' (1.524 m)   Wt 138 lb 9.6 oz (62.9 kg)   SpO2 97%   BMI 27.07 kg/m   General: Well-appearing 86 year old female, interactive HEENT: EOM intact, lesion noted on right eye, recommended follow-up with ophthalmologist Cardiac: Regular rate and rhythm Respiratory: No work of breathing, lungs clear to auscultation bilaterally Abdomen: Soft, nontender, positive bowel sounds SK: No gross abnormalities  ASSESSMENT/PLAN:   B12 deficiency B12 levels evaluated at this visit.  Patient does have low B12 levels.  Ordered B12 injections weekly for 1 month and will need to be followed up by monthly after that.  Repeat vitamin B12 in 3 to 6 months.    Annual Examination  See AVS for age appropriate recommendations  PHQ score 0, reviewed and discussed.  BP reviewed and at goal at goal.   Considered the following items based upon USPSTF recommendations: Diabetes screening: ordered Screening for elevated cholesterol: ordered.  Vaccinations up-to-date.   Follow up in 1  year or sooner if indicated.    Gifford Shave, MD [place]

## 2021-08-28 DIAGNOSIS — E538 Deficiency of other specified B group vitamins: Secondary | ICD-10-CM | POA: Insufficient documentation

## 2021-08-28 LAB — LIPID PANEL
Chol/HDL Ratio: 2.7 ratio (ref 0.0–4.4)
Cholesterol, Total: 178 mg/dL (ref 100–199)
HDL: 65 mg/dL (ref >39)
LDL Chol Calc (NIH): 98 mg/dL (ref 0–99)
Triglycerides: 80 mg/dL (ref 0–149)
VLDL Cholesterol Cal: 15 mg/dL (ref 5–40)

## 2021-08-28 LAB — VITAMIN B12: Vitamin B-12: 191 pg/mL — ABNORMAL LOW (ref 232–1245)

## 2021-08-28 MED ORDER — CYANOCOBALAMIN 1000 MCG/ML IJ SOLN: 1000.0000 ug | INTRAMUSCULAR | Status: AC

## 2021-08-28 NOTE — Assessment & Plan Note (Signed)
B12 levels evaluated at this visit.  Patient does have low B12 levels.  Ordered B12 injections weekly for 1 month and will need to be followed up by monthly after that.  Repeat vitamin B12 in 3 to 6 months.

## 2021-12-23 ENCOUNTER — Telehealth: Payer: Self-pay

## 2021-12-23 NOTE — Patient Outreach (Signed)
  Care Coordination   12/23/2021 Name: Ebony Pierce MRN: [id number] DOB: 09-07-1935   Care Coordination Outreach Attempts:  An unsuccessful telephone outreach was attempted today to offer the patient information about available care coordination services as a benefit of their health plan.   Follow Up Plan:  Additional outreach attempts will be made to offer the patient care coordination information and services.   Encounter Outcome:  No Answer  Care Coordination Interventions Activated:  No   Care Coordination Interventions:  No, not indicated    Jone Baseman, RN, MSN [facility] Care Management Care Management Coordinator Direct Line [phone]

## 2022-01-06 ENCOUNTER — Telehealth: Payer: Self-pay

## 2022-01-06 NOTE — Patient Outreach (Signed)
  Care Coordination   01/06/2022 Name: Ebony Pierce MRN: [id number] DOB: 1935-05-05   Care Coordination Outreach Attempts:  A second unsuccessful outreach was attempted today to offer the patient with information about available care coordination services as a benefit of their health plan.     Follow Up Plan:  Additional outreach attempts will be made to offer the patient care coordination information and services.   Encounter Outcome:  No Answer  Care Coordination Interventions Activated:  No   Care Coordination Interventions:  No, not indicated    Jone Baseman, RN, MSN [facility] Care Management Care Management Coordinator Direct Line [phone]

## 2022-01-13 ENCOUNTER — Ambulatory Visit: Payer: Medicare Other

## 2022-01-14 ENCOUNTER — Telehealth: Payer: Self-pay

## 2022-01-14 NOTE — Telephone Encounter (Signed)
Patient scheduled B12 apt for nurse clinic on 10/24.  Patient has not been seen since June 2023. B12 injections were initiated at that time, however patient never came in to get them.   Being almost 6 months later opted to schedule patient with a provider to discuss and start B12 injections at that time.   Patient and daughter agreeable with plan.

## 2022-01-16 ENCOUNTER — Encounter: Payer: Self-pay | Admitting: Student

## 2022-01-16 ENCOUNTER — Ambulatory Visit (INDEPENDENT_AMBULATORY_CARE_PROVIDER_SITE_OTHER): Payer: Medicare Other | Admitting: Student

## 2022-01-16 VITALS — BP 146/82 | HR 63 | Ht 60.0 in | Wt 136.8 lb

## 2022-01-16 DIAGNOSIS — D518 Other vitamin B12 deficiency anemias: Secondary | ICD-10-CM

## 2022-01-16 DIAGNOSIS — F03918 Unspecified dementia, unspecified severity, with other behavioral disturbance: Secondary | ICD-10-CM

## 2022-01-16 DIAGNOSIS — Z23 Encounter for immunization: Secondary | ICD-10-CM | POA: Diagnosis not present

## 2022-01-16 DIAGNOSIS — F05 Delirium due to known physiological condition: Secondary | ICD-10-CM | POA: Diagnosis not present

## 2022-01-16 DIAGNOSIS — D539 Nutritional anemia, unspecified: Secondary | ICD-10-CM

## 2022-01-16 MED ORDER — CYANOCOBALAMIN 1000 MCG/ML IJ SOLN
1000.0000 ug | Freq: Once | INTRAMUSCULAR | Status: AC
  Administered 2022-01-16: 1000 ug via INTRAMUSCULAR

## 2022-01-16 MED ORDER — MIRTAZAPINE 7.5 MG PO TABS: 7.5000 mg | ORAL_TABLET | Freq: Every day | ORAL | 0 refills | Status: DC

## 2022-01-16 MED ORDER — CYANOCOBALAMIN 500 MCG PO TABS: 1000.0000 ug | ORAL_TABLET | Freq: Every day | ORAL | 0 refills | Status: DC

## 2022-01-16 NOTE — Assessment & Plan Note (Addendum)
Patient never received treatment for known B12 defieicncy anemia 4 months ago. - recheck B12 level today  - recheck CBC - give 1000 mcg B12 shot today in clinic - prescribed 1000 mcg PO daily B12 for 6 weeks.  - recheck B12 in one month  - follow up appointment in January

## 2022-01-16 NOTE — Assessment & Plan Note (Addendum)
Daughter would like something for sleep. Patient already seen in geriatric clinic, Dr. McDiarmid recommended 7.5 mg mirtazapine nightly.  - prescription sent to pharmacy

## 2022-01-16 NOTE — Patient Instructions (Signed)
It was great to see you today! Thank you for choosing [facility] for your primary care.   Today we addressed: B12 deficiency, you are receiving a shot here in clinic of B12 and I am rechecking your blood levels.  I gave you a prescription for oral B12 pills that he should take for at least 6 weeks.  After 6 weeks please come back and have your labs rechecked.  I also placed an order for Remeron, which can help with sleep at night.  I sent in 30 pills, if this medication works let me know and I will send in refills.  You should return to our clinic Return in about 2 months (around 03/18/2022).  Please arrive 15 minutes before your appointment to ensure smooth check in process.  We appreciate your efforts in making this happen.  Please call the clinic at [phone] if your symptoms worsen or you have any concerns.  Thank you for allowing me to participate in your care, Dr. Sabra Heck

## 2022-01-16 NOTE — Progress Notes (Signed)
    SUBJECTIVE:   CHIEF COMPLAINT / HPI:   Ebony Pierce is a 86 y.o. female  presenting for continuation of B12 shots. B12 4 months ago 191.   Last visit 08/2021: B12 shots were ordered weekly for 1 month then followed monthly. Patient never came in for treatment.   PERTINENT  PMH / PSH: dementia, decreased vision, fatigue, macrocytic anemia, leukocytosis.   OBJECTIVE:   BP (!) 146/82   Pulse 63   Wt 136 lb 12.8 oz (62.1 kg)   SpO2 100%   BMI 26.72 kg/m   Shuffles feet when walking, needs assistance of daughter  Chronically ill-appearing, no acute distress Cardio: Regular rate, regular rhythm, no murmurs on exam. Pulm: Clear, no wheezing, no crackles. No increased work of breathing Abdominal: bowel sounds present, soft, non-tender, non-distended Extremities: no peripheral edema    ASSESSMENT/PLAN:   B12 deficiency anemia Patient never received treatment for known B12 defieicncy anemia 4 months ago. - recheck B12 level today  - recheck CBC - give 1000 mcg B12 shot today in clinic - prescribed 1000 mcg PO daily B12 for 6 weeks.  - recheck B12 in one month  - follow up appointment in January   Sundowning Daughter would like something for sleep. Patient already seen in geriatric clinic, Dr. McDiarmid recommended 7.5 mg mirtazapine nightly.  - prescription sent to pharmacy   Flu shot received today    Darci Current, [place]

## 2022-01-17 LAB — CBC
Hematocrit: 42.1 % (ref 34.0–46.6)
Hemoglobin: 13.8 g/dL (ref 11.1–15.9)
MCH: 28.9 pg (ref 26.6–33.0)
MCHC: 32.8 g/dL (ref 31.5–35.7)
MCV: 88 fL (ref 79–97)
Platelets: 295 10*3/uL (ref 150–450)
RBC: 4.78 x10E6/uL (ref 3.77–5.28)
RDW: 12.3 % (ref 11.7–15.4)
WBC: 5.3 10*3/uL (ref 3.4–10.8)

## 2022-01-17 LAB — VITAMIN B12: Vitamin B-12: >2000 pg/mL — ABNORMAL HIGH (ref 232–1245)

## 2022-01-21 ENCOUNTER — Telehealth: Payer: Self-pay

## 2022-01-21 NOTE — Patient Outreach (Signed)
  Care Coordination   01/21/2022 Name: Ebony Pierce MRN: [id number] DOB: 1936/01/24   Care Coordination Outreach Attempts:  A third unsuccessful outreach was attempted today to offer the patient with information about available care coordination services as a benefit of their health plan.   Follow Up Plan:  No further outreach attempts will be made at this time. We have been unable to contact the patient to offer or enroll patient in care coordination services  Encounter Outcome:  No Answer  Care Coordination Interventions Activated:  No   Care Coordination Interventions:  No, not indicated    Jone Baseman, RN, MSN [place] Management Care Management Coordinator Direct Line [phone]

## 2022-02-10 ENCOUNTER — Other Ambulatory Visit: Payer: Self-pay | Admitting: Student

## 2022-02-10 ENCOUNTER — Telehealth: Payer: Self-pay

## 2022-02-10 DIAGNOSIS — F05 Delirium due to known physiological condition: Secondary | ICD-10-CM

## 2022-02-10 DIAGNOSIS — F03918 Unspecified dementia, unspecified severity, with other behavioral disturbance: Secondary | ICD-10-CM

## 2022-02-10 NOTE — Telephone Encounter (Signed)
Patient's daughter calls nurse line regarding possible allergic reaction. She reports that yesterday evening she washed mother's face with new dollar tree shower gel. Shortly after, patient's face began to become red and she noticed puffiness at her eyes. She administered Benadryl last night. Today redness has improved, however, eyes are still puffy. Recommended against the use of Benadryl due to patient's age and sedative effects. Denies difficulty breathing or swallowing.   Advised of supportive measures. Daughter reports they have Zyrtec at home and she can give her this for symptoms.   ED precautions/ return precautions discussed.   No further questions at this time.   Veronda Prude, RN

## 2022-02-25 ENCOUNTER — Other Ambulatory Visit: Payer: Self-pay | Admitting: Student

## 2022-02-25 DIAGNOSIS — D539 Nutritional anemia, unspecified: Secondary | ICD-10-CM

## 2022-04-29 ENCOUNTER — Ambulatory Visit: Payer: Medicare Other

## 2022-04-29 NOTE — Progress Notes (Deleted)
    SUBJECTIVE:   CHIEF COMPLAINT / HPI:  No chief complaint on file.   ***  PERTINENT  PMH / PSH: Dementia  Patient Care Team: Alen Bleacher, MD as PCP - General (Family Medicine)   OBJECTIVE:   There were no vitals taken for this visit.  Physical Exam      01/16/2022    3:10 PM  Depression screen PHQ 2/9  Decreased Interest 3  Down, Depressed, Hopeless 0  PHQ - 2 Score 3  Altered sleeping 3  Tired, decreased energy 2  Change in appetite 0  Feeling bad or failure about yourself  0  Trouble concentrating 3  Moving slowly or fidgety/restless 0  Suicidal thoughts 0  PHQ-9 Score 11  Difficult doing work/chores Somewhat difficult     {Show previous vital signs (optional):23777}  {Labs  Heme  Chem  Endocrine  Serology  Results Review (optional):23779}  ASSESSMENT/PLAN:   No problem-specific Assessment & Plan notes found for this encounter.    No follow-ups on file.   Zola Button, MD [place]

## 2022-04-29 NOTE — Patient Instructions (Incomplete)
It was nice seeing you today!  Blood work today.  See me in 3 months or whenever is a good for you.  Stay well, Ebony Gerstenberger, MD [facility] [phone]  --  Make sure to check out at the front desk before you leave today.  Please arrive at least 15 minutes prior to your scheduled appointments.  If you had blood work today, I will send you a MyChart message or a letter if results are normal. Otherwise, I will give you a call.  If you had a referral placed, they will call you to set up an appointment. Please give us a call if you don't hear back in the next 2 weeks.  If you need additional refills before your next appointment, please call your pharmacy first.  

## 2022-04-30 ENCOUNTER — Ambulatory Visit (INDEPENDENT_AMBULATORY_CARE_PROVIDER_SITE_OTHER): Payer: Medicare Other | Admitting: Family Medicine

## 2022-04-30 ENCOUNTER — Encounter: Payer: Self-pay | Admitting: Family Medicine

## 2022-04-30 ENCOUNTER — Other Ambulatory Visit: Payer: Self-pay | Admitting: Family Medicine

## 2022-04-30 ENCOUNTER — Other Ambulatory Visit: Payer: Self-pay

## 2022-04-30 VITALS — BP 130/80 | HR 67 | Ht 60.0 in | Wt 139.0 lb

## 2022-04-30 DIAGNOSIS — I10 Essential (primary) hypertension: Secondary | ICD-10-CM | POA: Diagnosis not present

## 2022-04-30 DIAGNOSIS — J040 Acute laryngitis: Secondary | ICD-10-CM | POA: Diagnosis not present

## 2022-04-30 MED ORDER — LOSARTAN POTASSIUM 50 MG PO TABS: 50.0000 mg | ORAL_TABLET | Freq: Every day | ORAL | 3 refills | Status: DC

## 2022-04-30 NOTE — Patient Instructions (Addendum)
It was wonderful to see you today.  Please bring ALL of your medications with you to every visit.   Today we talked about:  I am starting you on a new medication for your blood pressure. You should return on 2/22 for blood work and then I will see you on 2/26 for a blood pressure check.   Thank you for coming to your visit as scheduled. We have had a large "no-show" problem lately, and this significantly limits our ability to see and care for patients. As a friendly reminder- if you cannot make your appointment please call to cancel. We do have a no show policy for those who do not cancel within 24 hours. Our policy is that if you miss or fail to cancel an appointment within 24 hours, 3 times in a 87-month period, you may be dismissed from our clinic.   Thank you for choosing [facility].   Please call [phone] with any questions about today's appointment.  Please be sure to schedule follow up at the front  desk before you leave today.   Sharion Settler, DO PGY-3 Family Medicine

## 2022-04-30 NOTE — Addendum Note (Signed)
Addended by: Sharion Settler on: 04/30/2022 05:25 PM   Modules accepted: Orders

## 2022-04-30 NOTE — Progress Notes (Addendum)
    SUBJECTIVE:   CHIEF COMPLAINT / HPI:   Ebony Pierce is a 87 y.o. female who presents to the [facility] clinic today to discuss the following concerns:   Daughter interpreted for patient.  Elevated Blood Pressure Without diagnosis of HTN though has had elevated BP readings per chart review, seems to fluctuate. Not currently on anti-hypertensive medications. Daughter notes that their home health nurse noticed her blood pressure was high at home. D[facility] and it remained high. Denies chest pain, shortness of breath, lower extremity edema, headaches, and vision changes.   149/84 on 2/6 149/84 on 2/7    BP Readings from Last 3 Encounters:  04/30/22 (!) 170/87  01/16/22 (!) 146/82  08/27/21 112/70   Lost voice Last week she started to have mild congestion. She then started to lose her voice, it is mildly better today. No fevers. No sick contacts. No recent travel. No sore throat, no difficulty swallowing. No runny nose.   PERTINENT  PMH / PSH: Dementia with behavioral disturbance, age-related hearing loss  OBJECTIVE:   BP (!) 170/87   Pulse 67   Wt 139 lb (63 kg)   SpO2 100%   BMI 27.15 kg/m   Vitals:   04/30/22 1554 04/30/22 1618  BP: (!) 170/87 130/80  Pulse: 67   SpO2: 100%    General: NAD, pleasant, able to participate in exam HEENT: Normocephalic, oropharynx clear without erythema or tonsillar exudates, no nasal congestion  Neck: Supple, no cervical LAD  Cardiac: RRR, no murmurs. Respiratory: CTAB, normal effort, No wheezes, rales or rhonchi Extremities: no edema Psych: Normal affect and mood  ASSESSMENT/PLAN:   1. Primary hypertension Asymptomatic. Elevated initially but improved on re-check. She has had multiple elevated readings and intermittent normotensive readings. BP goal <150/90 per JNC-8 given age >68 without cardiovascular disease. Will hold off on medications until we can get a better sense of average BP. She is a fall risk  given her age and visual impairment.  - Basic Metabolic Panel - Scheduled for ambulatory blood pressure monitoring on 2/15 to get a better sense of average readings - Scheduled for BP f/u on 2/27 with me   2. Laryngitis Discussed conservative treatments. Appears to be improving with conservative treatments. No red flags. No fevers, SOB. Has associated viral symptoms such as runny nose.  - Monitor, supportive care   Sharion Settler, [place]

## 2022-05-01 LAB — BASIC METABOLIC PANEL
BUN/Creatinine Ratio: 22 (ref 12–28)
BUN: 17 mg/dL (ref 8–27)
CO2: 24 mmol/L (ref 20–29)
Calcium: 10.1 mg/dL (ref 8.7–10.3)
Chloride: 102 mmol/L (ref 96–106)
Creatinine, Ser: 0.78 mg/dL (ref 0.57–1.00)
Glucose: 97 mg/dL (ref 70–99)
Potassium: 4.3 mmol/L (ref 3.5–5.2)
Sodium: 142 mmol/L (ref 134–144)
eGFR: 75 mL/min/{1.73_m2} (ref >59)

## 2022-05-07 ENCOUNTER — Ambulatory Visit: Payer: Medicare Other | Admitting: Pharmacist

## 2022-05-14 ENCOUNTER — Other Ambulatory Visit: Payer: Self-pay

## 2022-05-19 ENCOUNTER — Ambulatory Visit: Payer: Self-pay | Admitting: Family Medicine

## 2022-06-08 ENCOUNTER — Telehealth: Payer: Self-pay | Admitting: Student

## 2022-06-08 NOTE — Telephone Encounter (Signed)
Contacted Ebony Pierce to schedule their annual wellness visit. Appointment made for 06/12/2022.  Thank you,  [place] Direct dial  [phone]

## 2022-06-11 ENCOUNTER — Ambulatory Visit (INDEPENDENT_AMBULATORY_CARE_PROVIDER_SITE_OTHER): Payer: Medicare Other | Admitting: Family Medicine

## 2022-06-11 ENCOUNTER — Encounter: Payer: Self-pay | Admitting: Family Medicine

## 2022-06-11 VITALS — BP 128/71 | HR 71 | Wt 139.2 lb

## 2022-06-11 DIAGNOSIS — I951 Orthostatic hypotension: Secondary | ICD-10-CM

## 2022-06-11 NOTE — Progress Notes (Signed)
I connected with  Samyuktha Sokolski and daughter Lesly Rubenstein on 06/12/22 by a audio enabled telemedicine application and verified that I am speaking with the correct person using two identifiers. Due to the patient limitation dementia and communication barrier language, I spoke with the patient daughter Lesly Rubenstein with the patient sitting with her.  Patient Location: Home  Provider Location: Home Office  I discussed the limitations of evaluation and management by telemedicine. The patient expressed understanding and agreed to proceed.  Subjective:   Ebony Pierce is a 87 y.o. female who presents for an Initial Medicare Annual Wellness Visit.  Review of Systems    Per HPI unless specifically indicated below.  Cardiac Risk Factors include: advanced age (>60men, >77 women);female gender, Orthostatic hypotension.          Objective:       06/11/2022   10:29 AM 04/30/2022    4:18 PM 04/30/2022    3:54 PM  Vitals with BMI  Height   5\' 0"   Weight 139 lbs 4 oz  139 lbs  BMI   [id number]  Systolic 0000000 [id number]  Diastolic 71 80 87  Pulse 71  67    Today's Vitals   06/12/22 0820  PainSc: 0-No pain   There is no height or weight on file to calculate BMI.     06/12/2022    8:43 AM 01/16/2022    2:30 PM 08/27/2021    4:15 PM 02/17/2021   12:22 AM 02/13/2021    7:04 PM 08/13/2020    2:02 PM 07/11/2020    2:06 PM  Advanced Directives  Does Patient Have a Medical Advance Directive? No No No No No No No  Does patient want to make changes to medical advance directive?    No - Patient declined No - Patient declined    Would patient like information on creating a medical advance directive?   No - Patient declined No - Patient declined No - Patient declined No - Patient declined No - Patient declined    Current Medications (verified) Outpatient Encounter Medications as of 06/12/2022  Medication Sig   acetaminophen (TYLENOL) 500 MG tablet Take 2 tablets (1,000 mg total) by mouth every 6 (six) hours as  needed.   Melatonin 5 MG CAPS Take 1-2 capsules (5-10 mg total) by mouth at bedtime. (Patient taking differently: Take 10 capsules by mouth at bedtime.)   mirtazapine (REMERON) 7.5 MG tablet TAKE 1 TABLET BY MOUTH AT BEDTIME.   MULTIPLE VITAMIN PO Take 1 tablet by mouth daily.   [facility] B-12 500 MCG tablet TAKE 2 TABLETS BY MOUTH EVERY DAY (Patient not taking: Reported on 06/12/2022)   Famotidine-Ca Carb-Mag Hydrox (PEPCID COMPLETE PO) Take 1 tablet by mouth daily as needed (stomach). (Patient not taking: Reported on 06/12/2022)   No facility-administered encounter medications on file as of 06/12/2022.    Allergies (verified) Shellfish allergy   History: Past Medical History:  Diagnosis Date   Age-related hearing loss 10/19/2016   Cataract    s/p R cataract surgery   Dementia with behavioral disturbance ([place]) 07/02/2015   [facility]CA 10/30 (Administered with assistance of [place] interpreter in exam room (11/12/16)   Reduced vision 09/16/2016   Weight loss, non-intentional 04/14/2018   Past Surgical History:  Procedure Laterality Date   CATARACT EXTRACTION Right 2012   CHOLECYSTECTOMY N/A 02/18/2021   Procedure: LAPAROSCOPIC CHOLECYSTECTOMY WITH INTRAOPERATIVE CHOLANGIOGRAM;  Surgeon: Mickeal Skinner, MD;  Location: [place];  Service: General;  Laterality: N/A;  ENDOSCOPIC RETROGRADE CHOLANGIOPANCREATOGRAPHY (ERCP) WITH PROPOFOL N/A 02/20/2021   Procedure: ENDOSCOPIC RETROGRADE CHOLANGIOPANCREATOGRAPHY (ERCP) WITH PROPOFOL;  Surgeon: Irene Shipper, MD;  Location: [facility] ENDOSCOPY;  Service: Endoscopy;  Laterality: N/A;   IR RADIOLOGIST EVAL & MGMT  03/03/2021   IR US GUIDE BX ASP/DRAIN  02/27/2021   IR US GUIDE BX ASP/DRAIN  02/27/2021   SPHINCTEROTOMY  02/20/2021   Procedure: Joan Mayans;  Surgeon: Irene Shipper, MD;  Location: [facility] ENDOSCOPY;  Service: Endoscopy;;   Family History  Problem Relation Age of Onset   Hypertension Mother    Social History   Socioeconomic History   Marital  status: Widowed    Spouse name: Not on file   Number of children: 4   Years of education: 27   Highest education level: Not on file  Occupational History   Occupation: Retired  Tobacco Use   Smoking status: Never   Smokeless tobacco: Never  Vaping Use   Vaping Use: Never used  Substance and Sexual Activity   Alcohol use: No    Alcohol/week: 0.0 standard drinks of alcohol   Drug use: No   Sexual activity: Not Currently  Other Topics Concern   Not on file  Social History Narrative   Lives at home with daughter and son-in-law.    Enjoys Child psychotherapist and walking     Primary family support persons is daughter who is also her paid Engineer, agricultural.    Transportation to appointments provided by daughter Lesly Rubenstein.   Receives the following community support services SSI, food stamps and CAPS services.       Social Determinants of Health   Financial Resource Strain: Low Risk  (07/04/2020)   Overall Financial Resource Strain (CARDIA)    Difficulty of Paying Living Expenses: Not hard at all  Food Insecurity: No Food Insecurity (06/12/2022)   Hunger Vital Sign    Worried About Running Out of Food in the Last Year: Never true    Ran Out of Food in the Last Year: Never true  Transportation Needs: No Transportation Needs (06/12/2022)   PRAPARE - Hydrologist (Medical): No    Lack of Transportation (Non-Medical): No  Physical Activity: Inactive (06/12/2022)   Exercise Vital Sign    Days of Exercise per Week: 0 days    Minutes of Exercise per Session: 0 min  Stress: No Stress Concern Present (06/12/2022)   [place]    Feeling of Stress : Only a little  Social Connections: Socially Isolated (06/12/2022)   Social Connection and Isolation Panel [NHANES]    Frequency of Communication with Friends and Family: Twice a week    Frequency of Social Gatherings with Friends and Family: More than three times a week     Attends Religious Services: Never    Marine scientist or Organizations: No    Attends Archivist Meetings: Never    Marital Status: Widowed    Tobacco Counseling Counseling given: No   Clinical Intake:  Pre-visit preparation completed: No  Pain : No/denies pain Pain Score: 0-No pain     Nutritional Status: BMI 25 -29 Overweight Nutritional Risks: None Diabetes: No  How often do you need to have someone help you when you read instructions, pamphlets, or other written materials from your doctor or pharmacy?: 1 - Never  Diabetic?No  Interpreter Needed?: No Patient Declined Interpreter : No Patient signed [place] waiver: Yes  Activities of Daily Living    06/12/2022    8:19 AM  In your present state of health, do you have any difficulty performing the following activities:  Hearing? 1  Vision? 1  Difficulty concentrating or making decisions? 1  Walking or climbing stairs? 1  Dressing or bathing? 1  Doing errands, shopping? 1    Patient Care Team: Alen Bleacher, MD as PCP - General (Family Medicine)  Indicate any recent Medical Services you may have received from other than [facility] providers in the past year (date may be approximate).     Assessment:   This is a routine wellness examination for [facility].   Hearing/Vision screen Daughter state she has some hearing loss, diagnosed with age related hearing loss Vision changes, appt scheduled with Ophthalmologist on 06/23/22.  Dietary issues and exercise activities discussed: Current Exercise Habits: The patient does not participate in regular exercise at present, Exercise limited by: neurologic condition(s)   Goals Addressed   None    Depression Screen    06/12/2022    8:18 AM 06/11/2022   10:29 AM 01/16/2022    3:10 PM 01/16/2022    2:30 PM 08/27/2021    4:17 PM 03/18/2021    4:36 PM 08/13/2020    2:03 PM  PHQ 2/9 Scores  PHQ - 2 Score 0 3 3 3 2 1  0  PHQ- 9 Score  8 11 11 8 10 3      Fall Risk    06/12/2022    8:19 AM 06/11/2022   10:29 AM 07/11/2020    2:06 PM 05/13/2020    2:28 PM 02/12/2020    4:26 PM  Fall Risk   Falls in the past year? 0 0 0 1 0  Number falls in past yr: 0 0 0 1 0  Injury with Fall? 0 0  0   Risk for fall due to : No Fall Risks      Follow up Falls evaluation completed        [place]:  Any stairs in or around the home? Yes  If so, are there any without handrails? No Home free of loose throw rugs in walkways, pet beds, electrical cords, etc? Yes  Adequate lighting in your home to reduce risk of falls? Yes   ASSISTIVE DEVICES UTILIZED TO PREVENT FALLS:  Life alert? No  Use of a cane, walker or w/c? No  Grab bars in the bathroom? No  Shower chair or bench in shower? Yes  Elevated toilet seat or a handicapped toilet? Yes   TIMED UP AND GO:  Was the test performed? Unable to perform, virtual appointment   Cognitive Function: Unable to assess due to the patient limitation dementia and communication barrier language. Denied interpreter.    11/13/2016   12:49 PM  Montreal Cognitive Assessment   Visuospatial/ Executive (0/5) 1  Naming (0/3) 1  Attention: Read list of digits (0/2) 2  Attention: Read list of letters (0/1) 0  Attention: Serial 7 subtraction starting at 100 (0/3) 1  Language: Repeat phrase (0/2) 2  Language : Fluency (0/1) 0  Abstraction (0/2) 0  Delayed Recall (0/5) 0  Orientation (0/6) 2  Total 9  Adjusted Score (based on education) 10      Immunizations Immunization History  Administered Date(s) Administered   Fluad Quad(high Dose 65+) 01/16/2022   Influenza,inj,Quad PF,6+ Mos 06/13/2015, 05/25/2018, 01/03/2019, 05/13/2020   PFIZER(Purple Top)SARS-COV-2 Vaccination 05/05/2019, 05/30/2019, 02/12/2020   Pneumococcal Conjugate-13 10/19/2016  Pneumococcal Polysaccharide-23 05/25/2018   Tdap 05/13/2020    TDAP status: Up to date  Flu Vaccine status: Up to  date  Pneumococcal vaccine status: Up to date  Covid-19 vaccine status: Information provided on how to obtain vaccines.   Qualifies for Shingles Vaccine? Yes   Zostavax completed No   Shingrix Completed?: No.    Education has been provided regarding the importance of this vaccine. Patient has been advised to call insurance company to determine out of pocket expense if they have not yet received this vaccine. Advised may also receive vaccine at local pharmacy or Health Dept. Verbalized acceptance and understanding.  Screening Tests Health Maintenance  Topic Date Due   Zoster Vaccines- Shingrix (1 of 2) Never done   DEXA SCAN  Never done   COVID-19 Vaccine (4 - 2023-24 season) 06/25/2022 (Originally 11/21/2021)   Medicare Annual Wellness (AWV)  06/12/2023   DTaP/Tdap/Td (2 - Td or Tdap) 05/13/2030   Pneumonia Vaccine 81+ Years old  Completed   INFLUENZA VACCINE  Completed   HPV VACCINES  Aged Out    Health Maintenance  Health Maintenance Due  Topic Date Due   Zoster Vaccines- Shingrix (1 of 2) Never done   DEXA SCAN  Never done    Colorectal cancer screening: No longer required.   Mammogram status: No longer required due to age.  Dexa Scan:  Lung Cancer Screening: (Low Dose CT Chest recommended if Age 48-80 years, 30 pack-year currently smoking OR have quit w/in 15years.) does not qualify.   Lung Cancer Screening Referral: not applicable   Additional Screening:  Hepatitis C Screening: does not qualify  Vision Screening: Recommended annual ophthalmology exams for early detection of glaucoma and other disorders of the eye. Is the patient up to date with their annual eye exam?  Yes  Who is the provider or what is the name of the office in which the patient attends annual eye exams? Appt scheduled on 06/23/2022 If pt is not established with a provider, would they like to be referred to a provider to establish care? No .   Dental Screening: Recommended annual dental exams for  proper oral hygiene  Community Resource Referral / Chronic Care Management: CRR required this visit?  No   CCM required this visit?  No      Plan:     I have personally reviewed and noted the following in the patient's chart:   Medical and social history Use of alcohol, tobacco or illicit drugs  Current medications and supplements including opioid prescriptions. Patient is not currently taking opioid prescriptions. Functional ability and status Nutritional status Physical activity Advanced directives List of other physicians Hospitalizations, surgeries, and ER visits in previous 12 months Vitals Screenings to include cognitive, depression, and falls Referrals and appointments  In addition, I have reviewed and discussed with patient certain preventive protocols, quality metrics, and best practice recommendations. A written personalized care plan for preventive services as well as general preventive health recommendations were provided to patient.    Ms. Bemus , Thank you for taking time to come for your Medicare Wellness Visit. I appreciate your ongoing commitment to your health goals. Please review the following plan we discussed and let me know if I can assist you in the future.   These are the goals we discussed:  Goals      Effective Long-Term Care Planning     Patient Goals/Self-Care Activities :  Complete Advance Directive packet,  Call LCSW if you have questions  Have advance directive notarized at providers office during appointment 08/13/20        This is a list of the screening recommended for you and due dates:  Health Maintenance  Topic Date Due   Zoster (Shingles) Vaccine (1 of 2) Never done   DEXA scan (bone density measurement)  Never done   COVID-19 Vaccine (4 - 2023-24 season) 06/25/2022*   Medicare Annual Wellness Visit  06/12/2023   DTaP/Tdap/Td vaccine (2 - Td or Tdap) 05/13/2030   Pneumonia Vaccine  Completed   Flu Shot  Completed   HPV  Vaccine  Aged Out  *Topic was postponed. The date shown is not the original due date.     Wilson Singer, [place]   06/12/2022  Nurse Notes: Approximately 30 minute Non-Face -To-Face Medicare Wellness Visit

## 2022-06-11 NOTE — Assessment & Plan Note (Signed)
-  patient presents here for follow up after multiple elevated BP readings, this seems to be resolved and vitals consistent today with orthostatic hypotension. Of note, she has not history of hypertension. -reassurance provided, do not recommend initiating anti-hypertensive therapy at this time -encouraged hydrated, advised to change positions slowly -advised to monitor BP at home and inform us when it is elevated, may consider ambulatory BP monitoring if this recurs  -scheduled follow up on 4/5 with PCP

## 2022-06-11 NOTE — Patient Instructions (Addendum)
????????????????????????!  ??????????????????????????????????????????? ??????????????????? ???????????????????????????????????????????????????????? (  Orthostatic hypotension) ????????????????????????????????????????????????????????????????????????? ??????????????????????????? ???????????????????????? 6-8 ?????????? ?????????????????????????????????????????????????????????????????????????? ???????????????????????????????????????????????? ?????????????? ?????????????????????????????? ??????????????????????????????????????????????????? ??????????  ???????????????????????????? 1-2 ??????????? ???????????????????????????????????????????  ?????????????????????????????????????????? 5 ?????? ???? 15.30 ?. ???????????????????????????????????? ??????????????????????????????????????   ?????????????????????????????????????????????????????!  ??????, ??.?????  ??????????????????????????????????????????????????????? ?????????????????? 15 ?????????????????????????????? ????????????????? 24 ??????????????????????????? ??????????????????? 2 ????? ?????????????????????? ???????????????????????????????? 3 ????? ????????????????????????????????????????? ????????????????????????????????????????????????????????? ???????? ???????????????????????????????????????????????! W?n n?? d?c? m?k th?? d?? phb khu?!  W?n n?? re? khuy k?n re???xng khw?m d?n loh?it k?hxng khu? l?w w?n n?? m?n d? d? m?k ley d? h?em??xn khu? ca m? s?ng th?? re?yk ?? khw?m d?n le??xd t?? m? phy??hi s?p?h?ph (Orthostatic hypotension) s??ng s??m?rt?h keid k?h??n me???x b?ng khr?ng khw?m d?n loh?it p?h?np?hwn t?m k?r pel??ynplng t?h??ng pord r?ks??? r??ngk?y h??? chm ch???n doy t?ng p? d???m n?? h??? d?? 6-8 k?w t?x w?n w?ng k?w n?? w?? bn ta k???ng te?yng thuk kh??n la d???m s?ng rk n?? n? txn ch? k?xn luk k?h??n y??n pord trwc s?xb h??? n?c? ?? d?? pel??yn t?h??ng x??ng c??t?m th?? kl??w w?? khu? m?? c?p?n t?xng ch?? y? n? wel? n?? h??k khu?  s??ngket h??n khw?m d?n loh?it k?hxng khu? phem k?h??n x?k khr?ng pord kl?b m?  k?b b?nth?k khw?m d?n loh?it k?hxng khu? 1-2 khr?ng t?x w?n la n? p? phb phthy? khr?ng t?x p? k?b khu? h?mx nxr?beir?t  pord tidt?m p?hl n? k?r n?d h?m?y khr?ng t?h?d p? n? w?n th?? 5 m?n?khm wel? 15.30 N. H??k m? xar? keid k?h??n rah???ng n?? p?nt?n p? pord x?? l?ngle th?? ca tidt?x s??n?kng?n k?hxng re?   k?hx k?hxbkhu? th?? h??? re? p?n s??wn h?n??ng k?hxng k?r r?ks??? phy?b?l k?hxng khu?!  K?hxbkhu?, dr.K?n ta  It was great seeing you today!  Today we discussed your blood pressure, it looks great today. You seem to have something called orthostatic hypotension which can occur when blood pressures sometimes fluctuate with position changes. Please make sure to stay hydrated, aim to drink 6-8 glasses of water a day. Place a glass of water on your nightstand each night and drink this first thing in the morning before you stand up. Please make sure to change positions slowly. As discussed, you do not need medication at this time. If you notice your blood pressures increasing again then please return.   Keep a log of your blood pressure 1-2 times a day and bring this to your next visit with Dr. Adah Salvage.   Please follow up at your next scheduled appointment on 4/5 at 3:30 pm, if anything arises between now and then, please don't hesitate to contact our office.   Thank you for allowing [place] to be a part of your medical care!  Thank you, Dr. Larae Grooms  Also a reminder of our clinic's no-show policy. Please make sure to arrive at least 15 minutes prior to your scheduled appointment time. Please try to cancel before 24 hours if you are not able to make it. If you no-show for 2 appointments then you will be receiving a warning letter. If you no-show after 3 visits, then you may be at risk of being dismissed from our clinic. This is to ensure that everyone is able to be seen in a timely manner. Thank you, we appreciate your  assistance with this!

## 2022-06-11 NOTE — Patient Instructions (Signed)

## 2022-06-11 NOTE — Progress Notes (Signed)
    SUBJECTIVE:   CHIEF COMPLAINT / HPI:   Patient presents for follow up of blood pressure, this has been an ongoing issue for the last 3 months per daughter. Patient has a history of dementia, most of the history obtained from daughter. No prior history of hypertension. Also has home health aide who checks BP every few months and that is when they noticed a high blood pressure. At that time, [facility] and daughter observed persistent high blood pressure which ranged around 149/84. Patient denies any symptoms at the time. Denies chest pain, dyspnea and leg swelling. Denies dizziness. Denies any difficulty with position changes at home and no falls. Daughter confirms that that patient does not drink much water daily.   OBJECTIVE:   BP 128/71   Pulse 71   Wt 139 lb 4 oz (63.2 kg)   SpO2 99%   BMI 27.20 kg/m   General: Patient well-appearing, in no acute distress. CV: RRR, no murmurs or gallops auscultated Resp: CTAB, no wheezing, rales or rhonchi noted Ext: no LE edema noted bilaterally Psych: mood appropriate, very pleasant   ASSESSMENT/PLAN:   Orthostatic hypotension -patient presents here for follow up after multiple elevated BP readings, this seems to be resolved and vitals consistent today with orthostatic hypotension. Of note, she has not history of hypertension. -reassurance provided, do not recommend initiating anti-hypertensive therapy at this time -encouraged hydrated, advised to change positions slowly -advised to monitor BP at home and inform us when it is elevated, may consider ambulatory BP monitoring if this recurs  -scheduled follow up on 4/5 with PCP   [place] interpretation utilized throughout the entirety of this encounter by daughter, they deny any formal interpretation.   Donney Dice, [place]

## 2022-06-12 ENCOUNTER — Ambulatory Visit (INDEPENDENT_AMBULATORY_CARE_PROVIDER_SITE_OTHER): Payer: Medicare Other

## 2022-06-12 DIAGNOSIS — Z0000 Encounter for general adult medical examination without abnormal findings: Secondary | ICD-10-CM

## 2022-06-12 DIAGNOSIS — E2839 Other primary ovarian failure: Secondary | ICD-10-CM | POA: Diagnosis not present

## 2022-06-23 DIAGNOSIS — H524 Presbyopia: Secondary | ICD-10-CM | POA: Diagnosis not present

## 2022-06-23 DIAGNOSIS — H25011 Cortical age-related cataract, right eye: Secondary | ICD-10-CM | POA: Diagnosis not present

## 2022-06-26 ENCOUNTER — Encounter: Payer: Self-pay | Admitting: Student

## 2022-06-26 ENCOUNTER — Ambulatory Visit (INDEPENDENT_AMBULATORY_CARE_PROVIDER_SITE_OTHER): Payer: Medicare Other | Admitting: Student

## 2022-06-26 VITALS — BP 126/84 | HR 75 | Ht 60.0 in | Wt 140.0 lb

## 2022-06-26 DIAGNOSIS — D518 Other vitamin B12 deficiency anemias: Secondary | ICD-10-CM | POA: Diagnosis not present

## 2022-06-26 DIAGNOSIS — I10 Essential (primary) hypertension: Secondary | ICD-10-CM

## 2022-06-26 DIAGNOSIS — E538 Deficiency of other specified B group vitamins: Secondary | ICD-10-CM

## 2022-06-26 DIAGNOSIS — R1011 Right upper quadrant pain: Secondary | ICD-10-CM | POA: Diagnosis not present

## 2022-06-26 NOTE — Assessment & Plan Note (Signed)
Unclear cause of chronic intermittent epigastric abdominal pain.  No red flag symptoms such as significant weight loss, blood in stool, nausea, vomiting normal bowel movements.  Does have history of reflux and given good response to Tums make his suspicious of gastritis/ dyspepsia or ulcer. -Encouraged continued use Tums PRN -Reviewed strict return precautions including blood in stool, significant weight loss -Will consider CBC check should symptoms persist.

## 2022-06-26 NOTE — Patient Instructions (Addendum)
It was wonderful to meet you today. Thank you for allowing me to be a part of your care. Below is a short summary of what we discussed at your visit today:  Blood pressure today is normal.  Home blood pressures have also been appropriate as well.  No need to start her on blood pressure medications at this time.  The abdominal pain being that it improved with Tums and no reported blood in stool I recommend continue use of Tums.  Patient gets worse with Tums please call to follow-up and we can switch to Pepcid.  Placed order to check B12 levels.  Please bring all of your medications to every appointment!  If you have any questions or concerns, please do not hesitate to contact us via phone or MyChart message.   Jerre Simon, MD [facility]

## 2022-06-26 NOTE — Progress Notes (Signed)
    SUBJECTIVE:   CHIEF COMPLAINT / HPI:   Patient is a 87 year old female presenting today for blood pressure follow-up.  Was recently seen about 2 weeks ago for concerns of elevated BPs.  At the last visit blood pressures was within normal limits and today blood pressure is 126/84.  Has not kept a blood pressure diary but reports blood pressures have mostly ranged in the 120s/70s.  Highest BP reading she remembers was 141/82.  Denies any headaches, shortness of breath or vision changes.  Complaint of abdominal pain. Daughter reports mother complaining of chronic abdominal pain that intermittent about 2-3 times a month.  Generally these pains come on in the morning after eating rice.  Has tried Tums which provided relief.  Mom has not noticed any nausea or vomiting or diarrhea.  No blood in stool reported.  Per chart review she has never had colonoscopy under eyes unsure if patient has had colonoscopy.  Of note mom had cholecystectomy due to cholecystitis in 2022.   PERTINENT  PMH / PSH: Reviewed   OBJECTIVE:   BP 126/84   Pulse 75   Ht 5' (1.524 m)   Wt 140 lb (63.5 kg)   SpO2 99%   BMI 27.34 kg/m    Physical Exam General: Alert, well appearing, NAD Cardiovascular: RRR, No Murmurs, Normal S2/S2 Respiratory: CTAB, No wheezing or Rales Abdomen: No distension or tenderness Extremities: No edema on extremities     ASSESSMENT/PLAN:   B12 deficiency History of B12 deficiency and previously on B12 supplement.  Recently had elevated B12 and discontinued B12 supplements about 5 months ago. -Obtain labs to check B12 levels -Will plan to restart B12 supplement if labs consistent with B12 deficiency.  RUQ pain Unclear cause of chronic intermittent epigastric abdominal pain.  No red flag symptoms such as significant weight loss, blood in stool, nausea, vomiting normal bowel movements.  Does have history of reflux and given good response to Tums make his suspicious of gastritis/ dyspepsia  or ulcer. -Encouraged continued use Tums PRN -Reviewed strict return precautions including blood in stool, significant weight loss -Will consider CBC check should symptoms persist.    Hypertension Blood pressure today was 126/74. Home BP reading has been appropriately in the 120/70s.  No headaches, dizziness, chest pain, or SOB. -Provided reassurance to family -Maintain ambulatory BP diary   Jerre Simon, MD [facility]

## 2022-06-26 NOTE — Assessment & Plan Note (Signed)
History of B12 deficiency and previously on B12 supplement.  Recently had elevated B12 and discontinued B12 supplements about 5 months ago. -Obtain labs to check B12 levels -Will plan to restart B12 supplement if labs consistent with B12 deficiency.

## 2022-06-27 LAB — VITAMIN B12: Vitamin B-12: 282 pg/mL (ref 232–1245)

## 2022-08-13 ENCOUNTER — Other Ambulatory Visit: Payer: Self-pay | Admitting: Student

## 2022-08-13 DIAGNOSIS — F05 Delirium due to known physiological condition: Secondary | ICD-10-CM

## 2022-08-13 DIAGNOSIS — F03918 Unspecified dementia, unspecified severity, with other behavioral disturbance: Secondary | ICD-10-CM

## 2022-12-24 IMAGING — CT CT ABD-PELV W/ CM
2 of 5 series · 15 of 46 positions shown, 17 images · IV contrast (APPLIED)
Comparison: Ultrasound abdomen 02/16/2021.

CLINICAL DATA: Right lower quadrant abdominal pain.

EXAM:
CT ABDOMEN AND PELVIS WITH CONTRAST
TECHNIQUE: Multidetector CT imaging of the abdomen and pelvis was performed
using the standard protocol following bolus administration of
intravenous contrast.
CONTRAST:  100mL OMNIPAQUE IOHEXOL 300 MG/ML  SOLN

[Series 3: abdomen 5.0 · axial · 0.90mm/px · z∈[+808,+1163]mm · 12 of 85 slices shown, 14 images]
[im 7/85  soft-tissue]
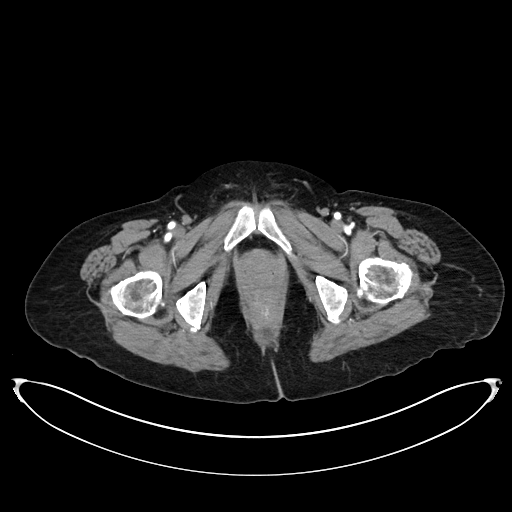
[im 7/85  bone]
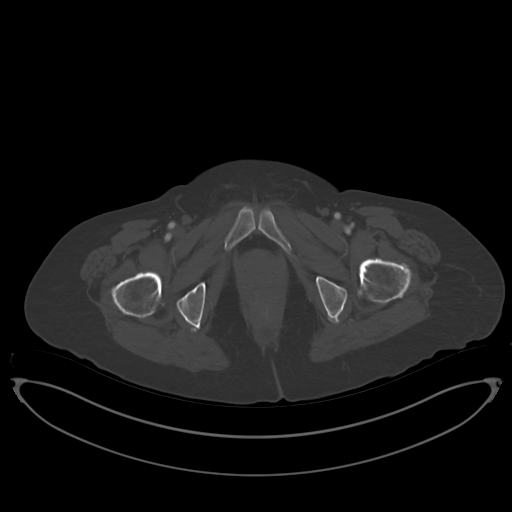
[im 13/85  soft-tissue]
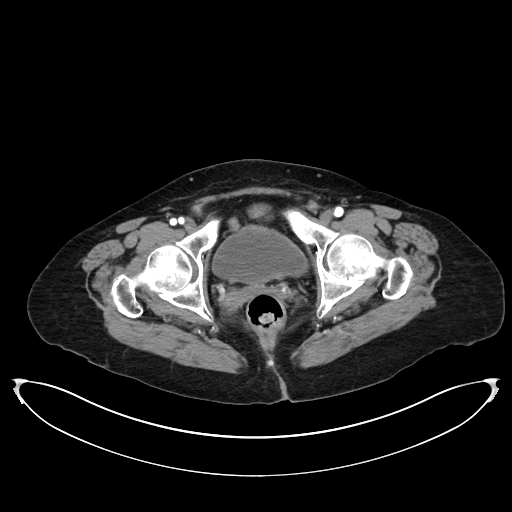
[im 20/85  soft-tissue]
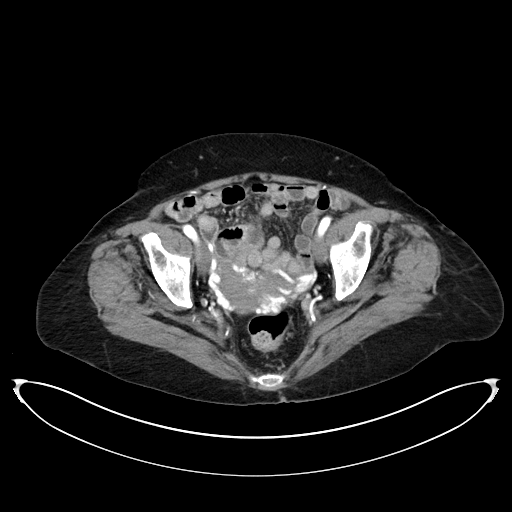
[im 26/85  soft-tissue]
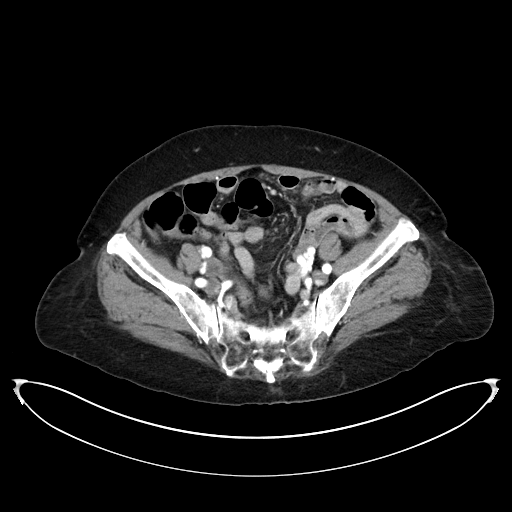
[im 33/85  soft-tissue]
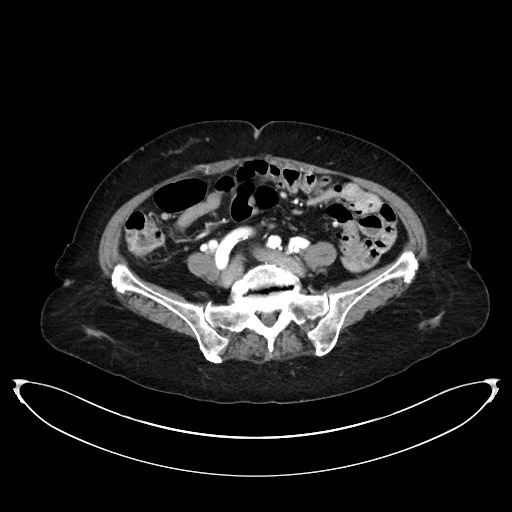
[im 39/85  soft-tissue]
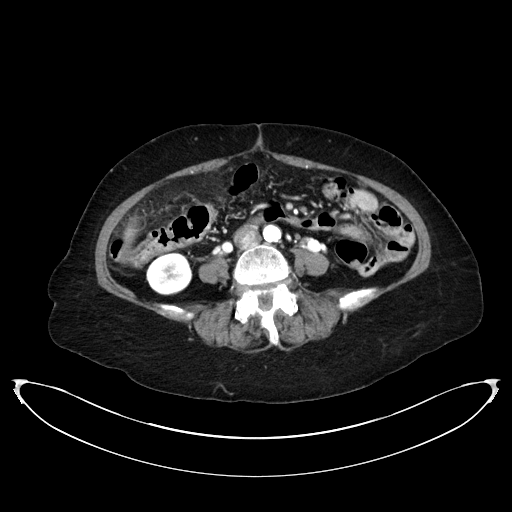
[im 46/85  soft-tissue]
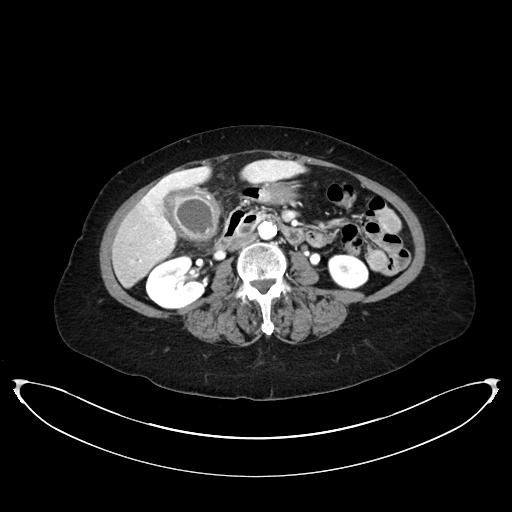
[im 52/85  soft-tissue]
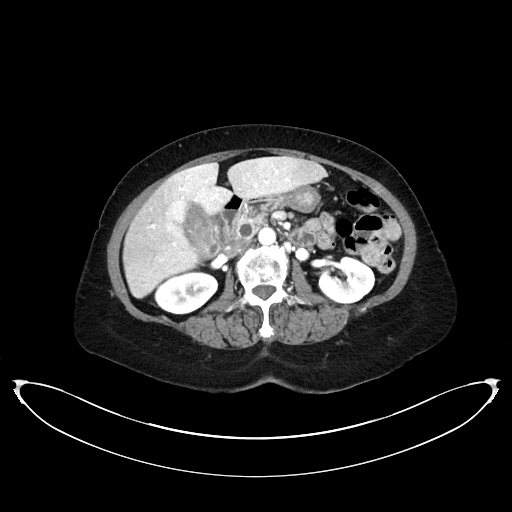
[im 59/85  soft-tissue]
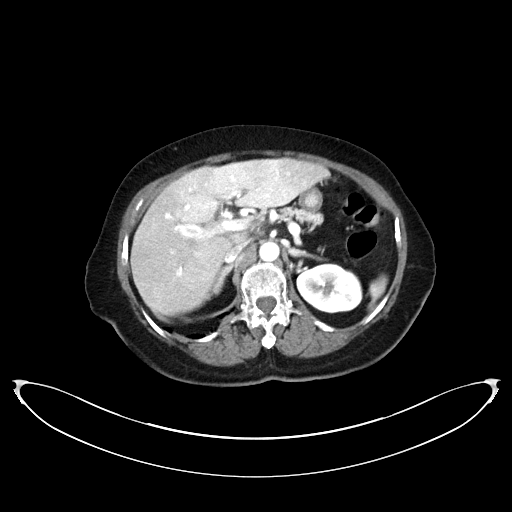
[im 59/85  bone]
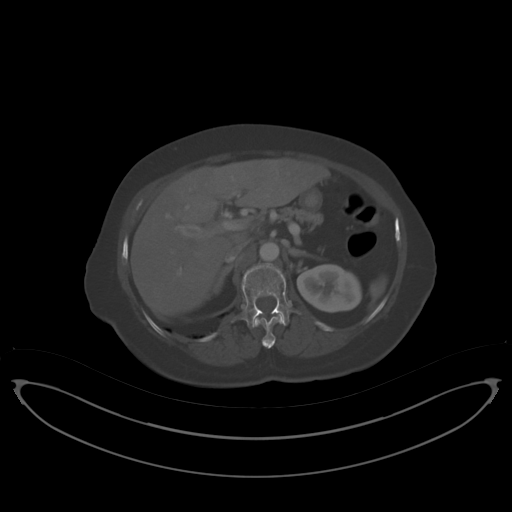
[im 65/85  soft-tissue]
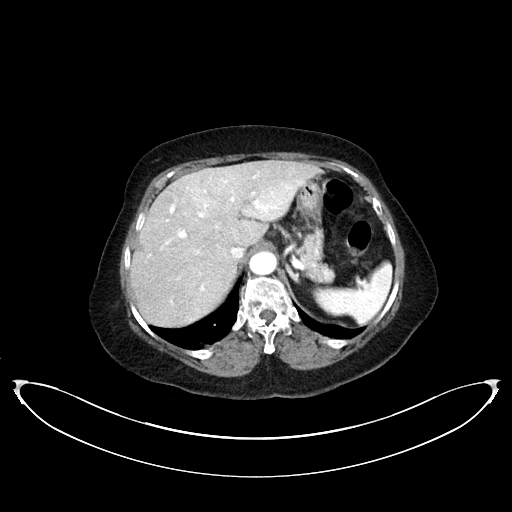
[im 72/85  soft-tissue]
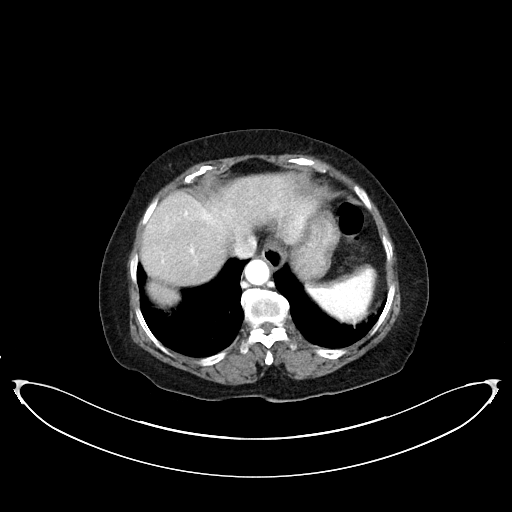
[im 78/85  soft-tissue]
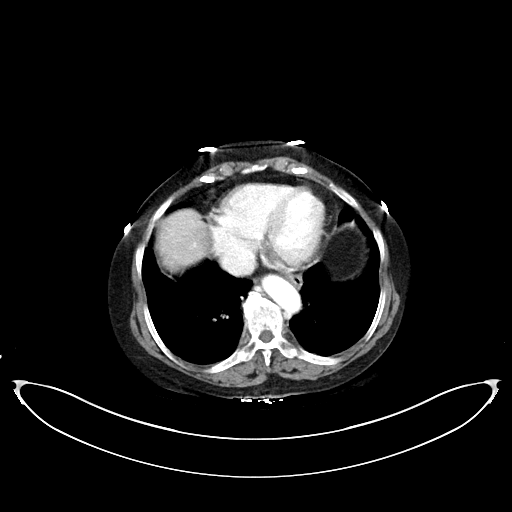

[Series 6: abdomen 3.0 mpr cor · coronal · 0.68mm/px · 3 of 88 slices shown]
[im 30/88  soft-tissue]
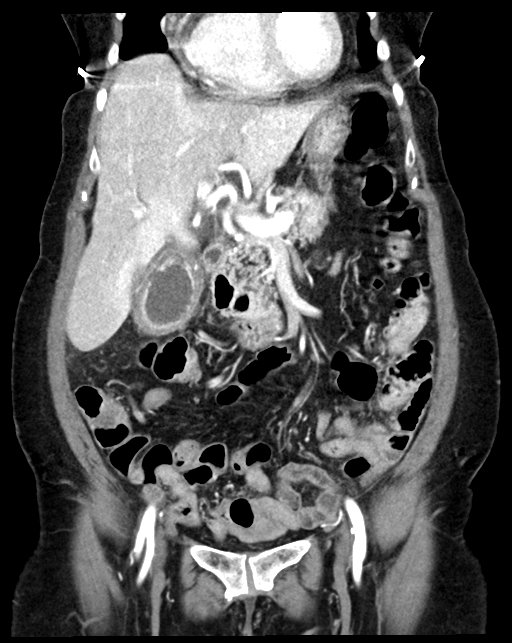
[im 39/88  soft-tissue]
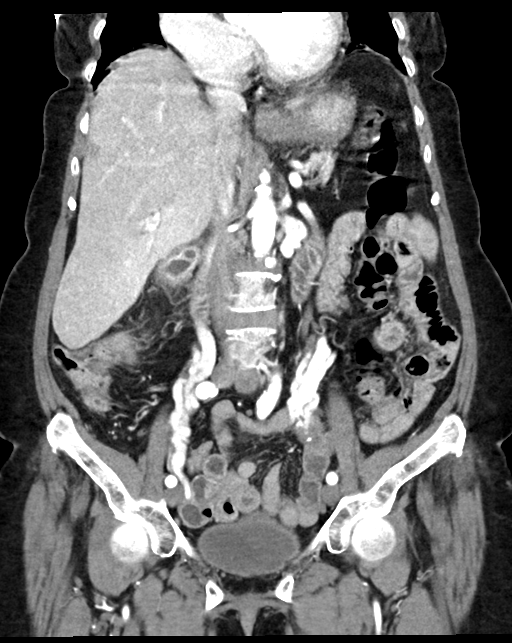
[im 49/88  soft-tissue]
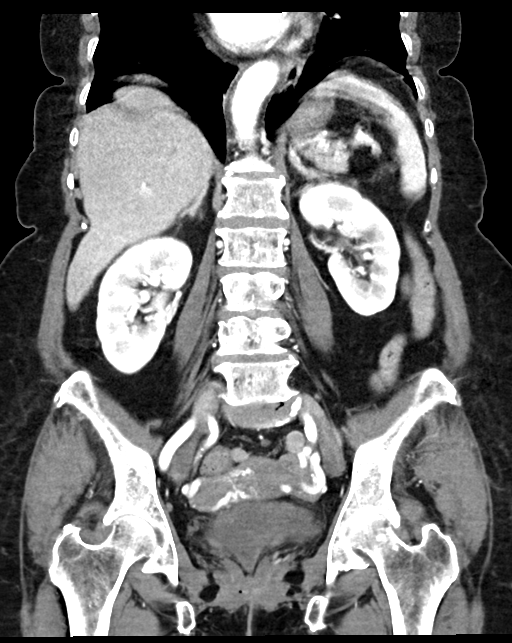

[15 of 46 positions shown; findings below may reference images not displayed]

FINDINGS: Lower chest: Right-sided fat containing Bochdalek hernia. Scarring
in the lung bases.

Hepatobiliary: There is diffuse marked gallbladder wall thickening
and edema. There is submucosal enhancement with some areas of
questionable wall irregularity internally. There is no gallbladder
air. Common bowel duct is dilated measuring up to 15 mm. No
radiopaque gallstones are identified. There is fatty infiltration of
the liver.

Pancreas: Unremarkable. No pancreatic ductal dilatation or
surrounding inflammatory changes.

Spleen: Normal in size without focal abnormality.

Adrenals/Urinary Tract: Small bladder diverticulum is present. The
kidneys and adrenal glands are within normal limits.

Stomach/Bowel: Stomach is within normal limits. Appendix appears
normal. No evidence of bowel wall thickening, distention, or
inflammatory changes.

Vascular/Lymphatic: Aortic atherosclerosis. No enlarged abdominal or
pelvic lymph nodes.

Reproductive: Uterus and bilateral adnexa are unremarkable.
Prominent pelvic vessels are seen.

Other: No abdominal wall hernia or abnormality. No abdominopelvic
ascites.

Musculoskeletal: Degenerative changes affect the spine. There is
grade 1 anterolisthesis at L4-L5.
IMPRESSION: 1. Marked wall thickening of the gallbladder with some areas of
mucosal irregularity. Biliary ductal dilatation. Findings are highly
suspicious for acute cholecystitis. Wall irregularity can be seen in
the setting of gangrenous cholecystitis. Please correlate
clinically. Surgical consultation recommended.
2. Small bladder diverticulum.
3. Prominent pelvic vessels, nonspecific.
4.  Aortic Atherosclerosis ([id number]).

## 2022-12-24 IMAGING — US US ABDOMEN LIMITED
1 series · 13 of 25 positions shown · non-contrast
Comparison: None.

CLINICAL DATA: Right upper quadrant pain.

EXAM:
ULTRASOUND ABDOMEN LIMITED RIGHT UPPER QUADRANT

[Series 1: us abdomen limited ruq (liver/gb) · 58 acquisitions, 13 frames shown]
[im 1/58]
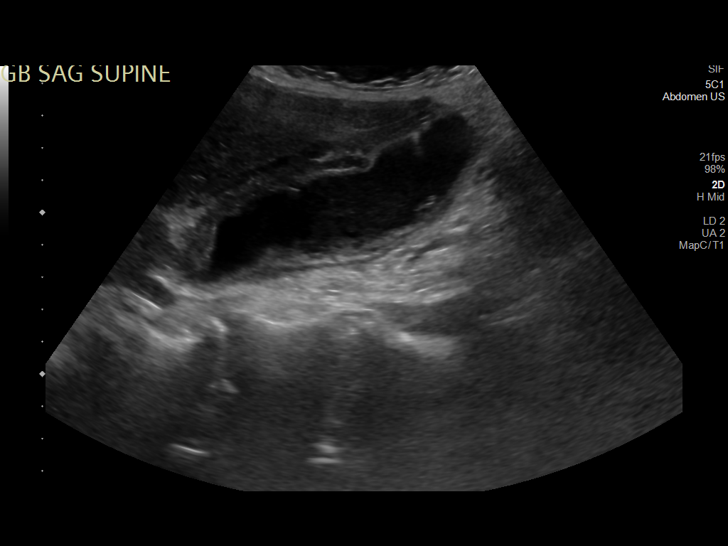
[im 5/58]
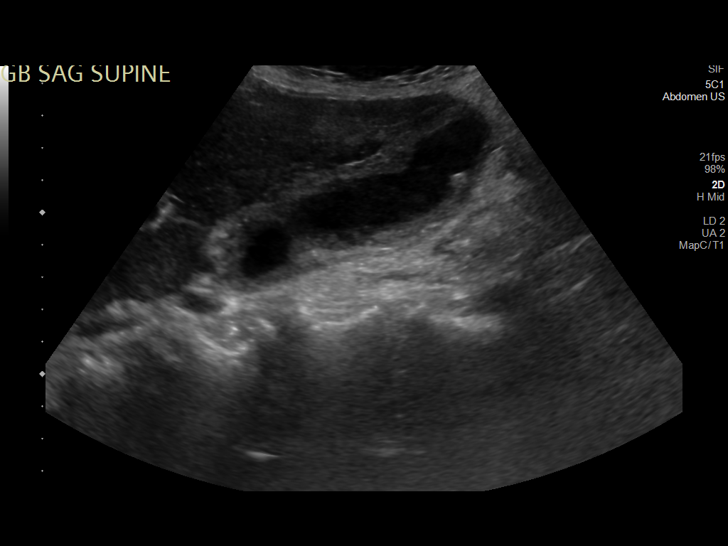
[im 10/58]
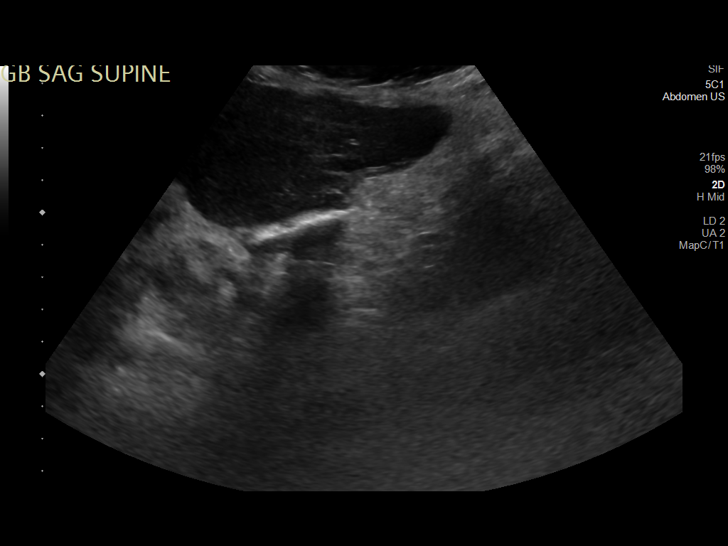
[im 15/58]
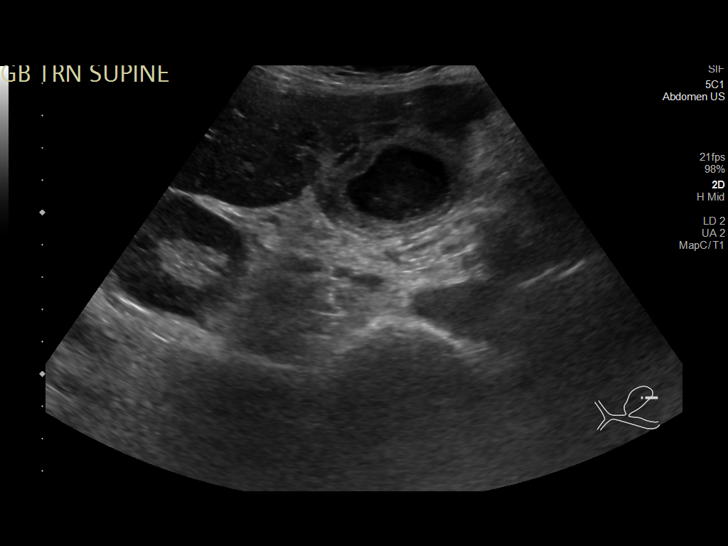
[im 20/58]
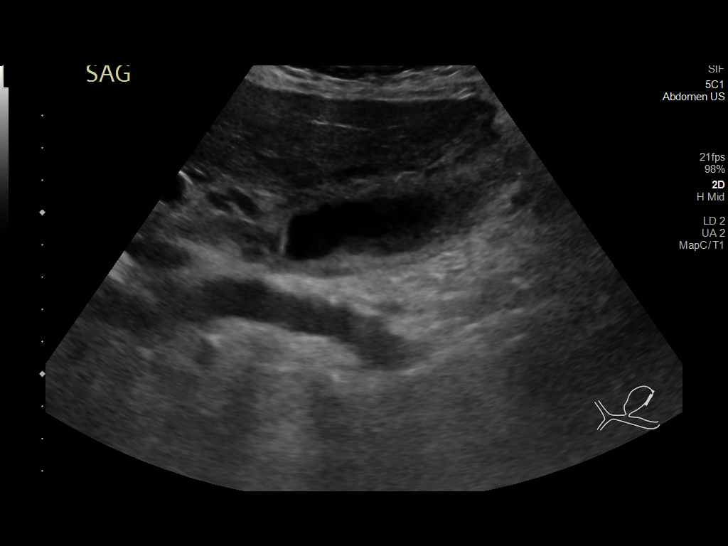
[im 24/58]
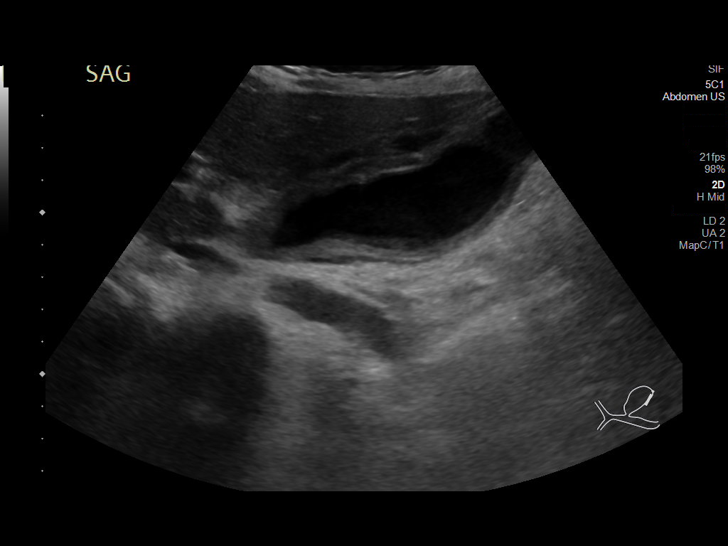
[im 29/58]
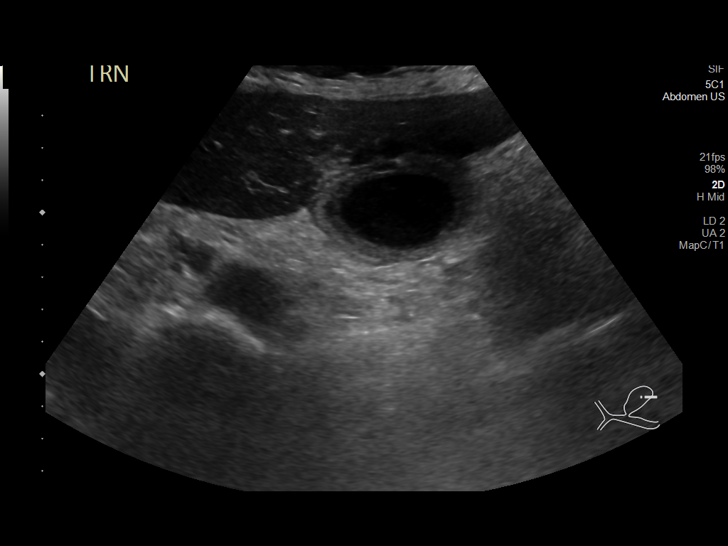
[im 34/58]
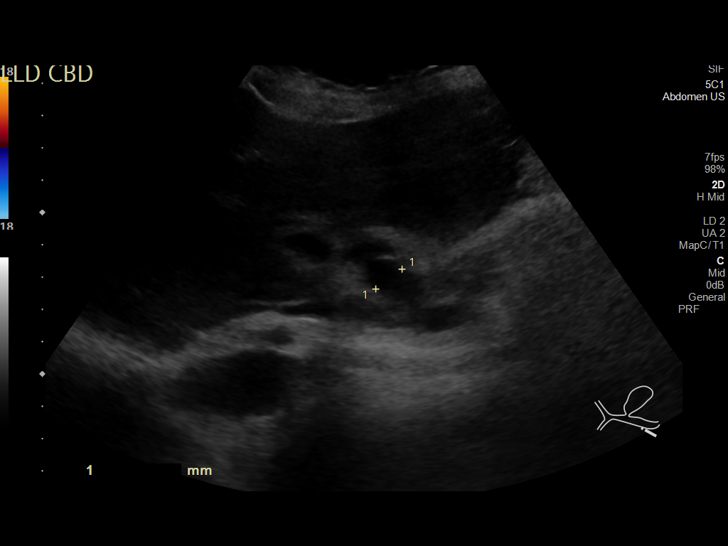
[im 39/58]
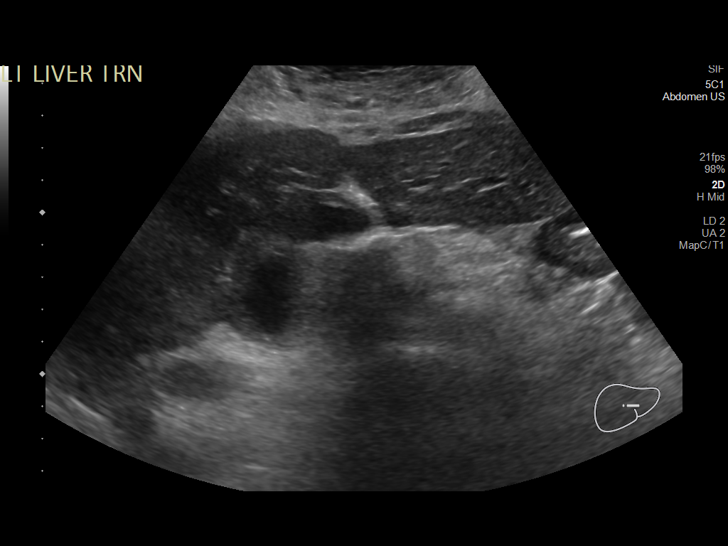
[im 43/58]
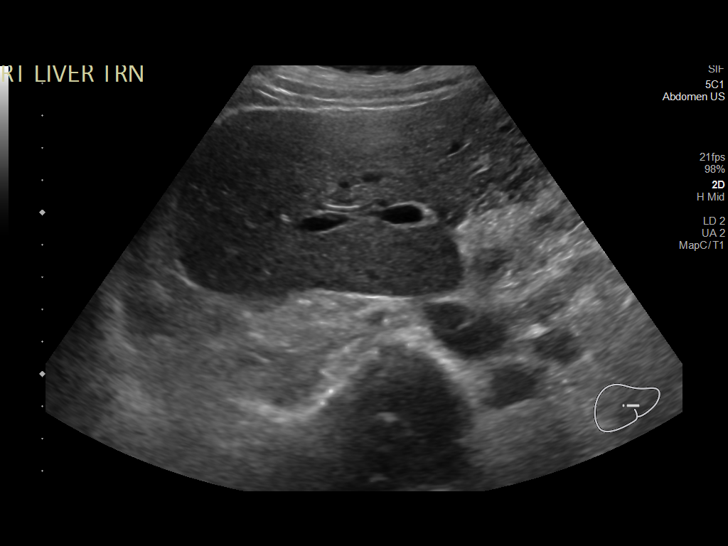
[im 48/58]
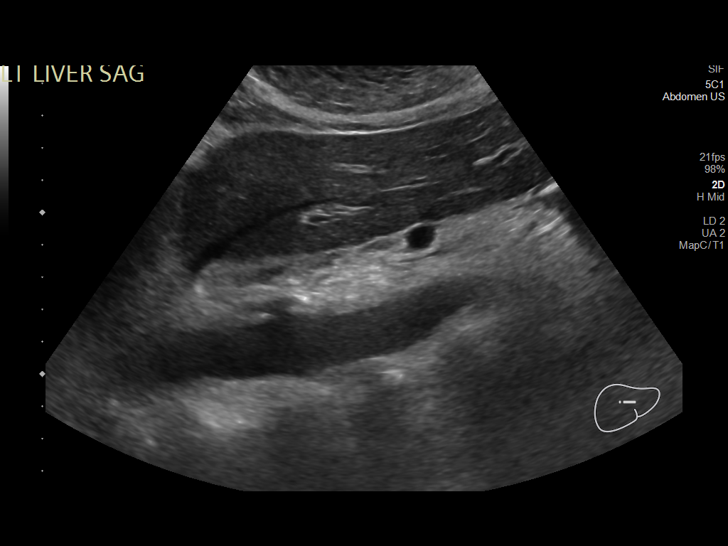
[im 53/58]
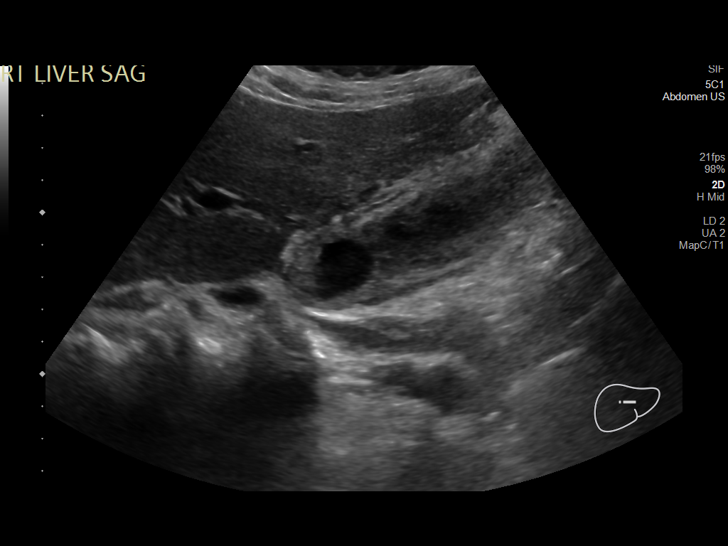
[im 58/58]
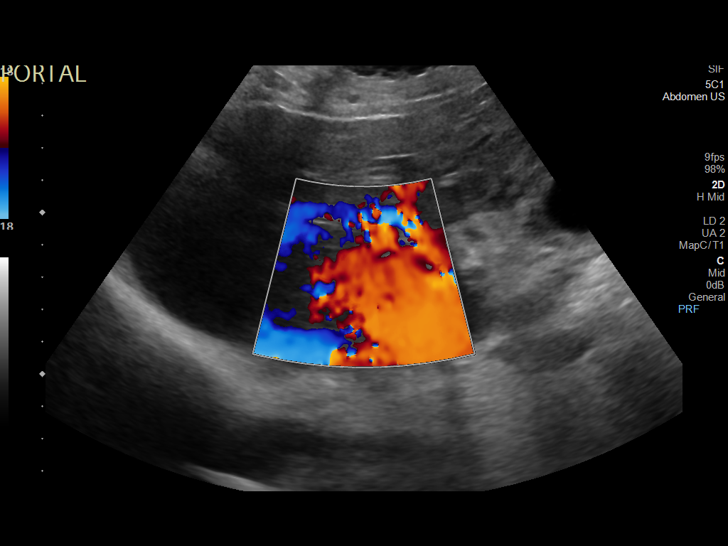

[13 of 25 positions shown; findings below may reference images not displayed]

FINDINGS: Gallbladder:

Mild to moderately distended. Wall is irregularly thickened linear
areas wall edema. There is also pericholecystic fluid. Small amount
of dependent sludge. One echogenic focus noted in the dependent
gallbladder consistent with a polyp or nonshadowing stone, 5 mm in
size. Patient is tender to transducer pressure over the gallbladder.

Common bile duct:

Diameter: 1 cm. No visualized duct stone. Most distal aspect of the
duct was obscured by bowel gas.

Liver:

No focal lesion identified. Within normal limits in parenchymal
echogenicity. Portal vein is patent on color Doppler imaging with
normal direction of blood flow towards the liver.

Other: None.
IMPRESSION: 1. Abnormal gallbladder. Wall is irregularly thickened with wall
edema, measuring up to 9 mm in thickness, associated with a small
amount of pericholecystic fluid as well as a positive sonographic
Murphy's sign. However, no shadowing stones are noted. Findings
support acute cholecystitis in the proper clinical setting. Findings
could be further assessed with a nuclear medicine HIDA scan.
2. Dilated common bile duct to 1 cm.  No visualized duct stone.

## 2022-12-26 IMAGING — RF DG CHOLANGIOGRAM OPERATIVE
1 series · 10 of 10 positions shown · non-contrast
Comparison: CT abdomen and pelvis-02/16/2021

CLINICAL DATA: Intraoperative cholangiogram during laparoscopic
cholecystectomy.

EXAM:
INTRAOPERATIVE CHOLANGIOGRAM
FLUOROSCOPY TIME:  16 seconds (3.2 mGy)

[Series 1: run · 4 acquisitions, 10 frames shown]
[im 1/4]
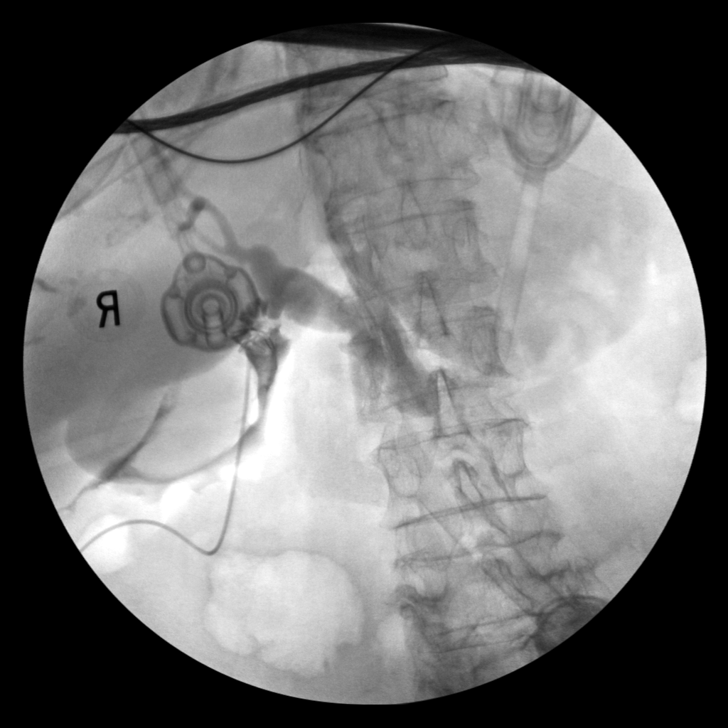
[im 1/4]
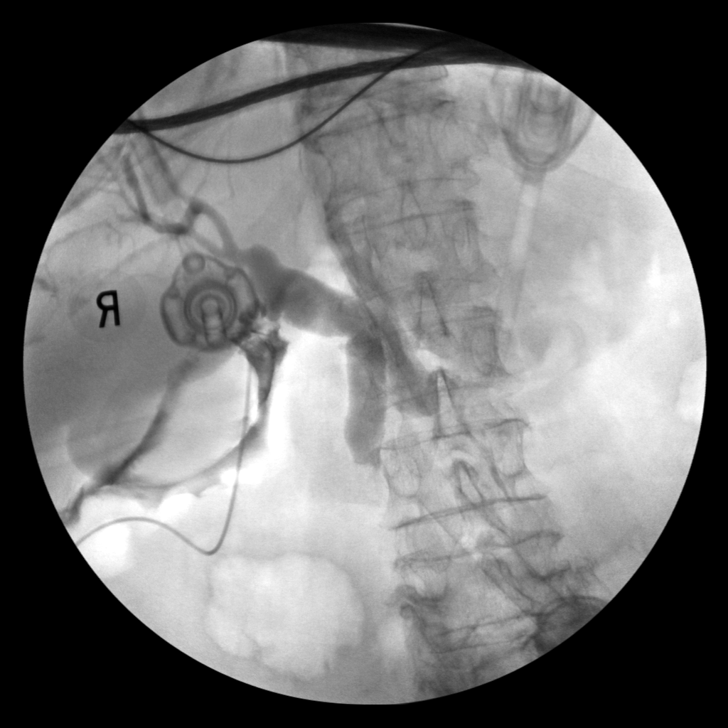
[im 1/4]
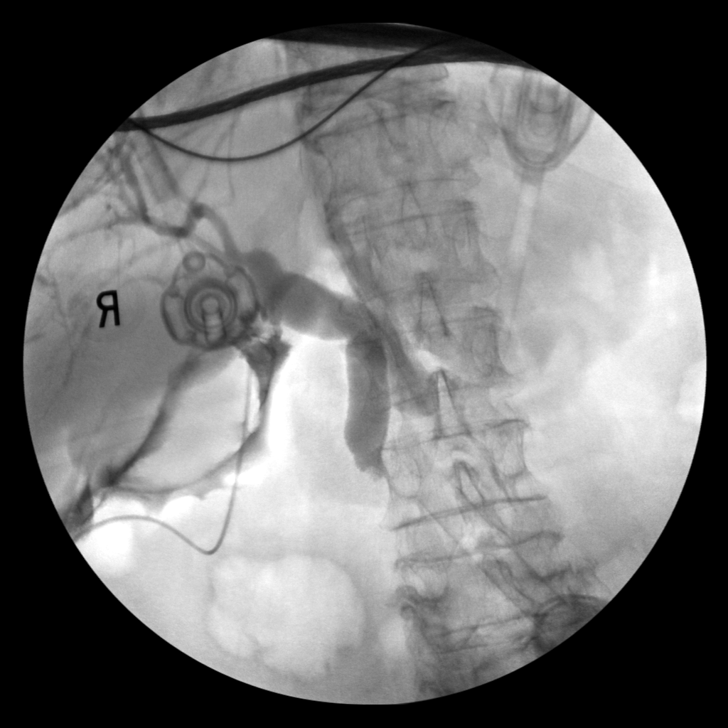
[im 1/4]
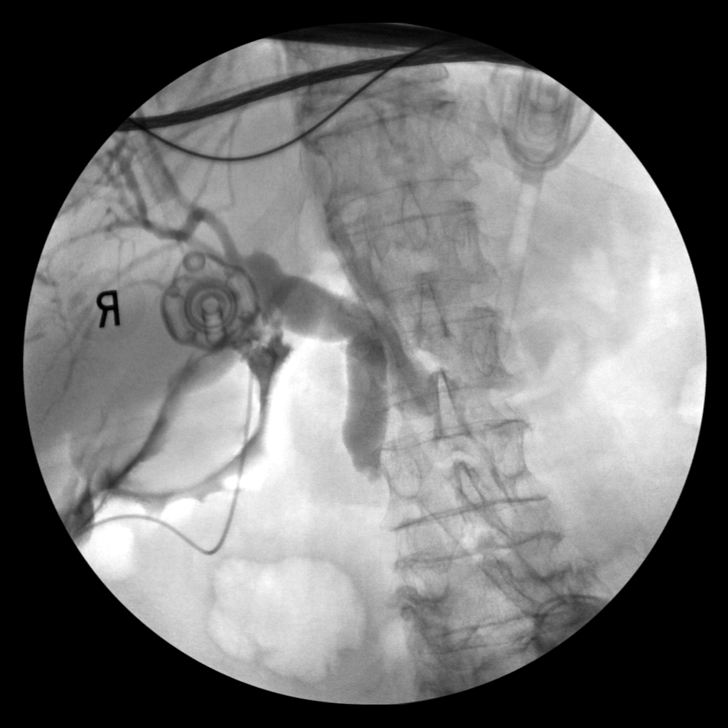
[im 2/4]
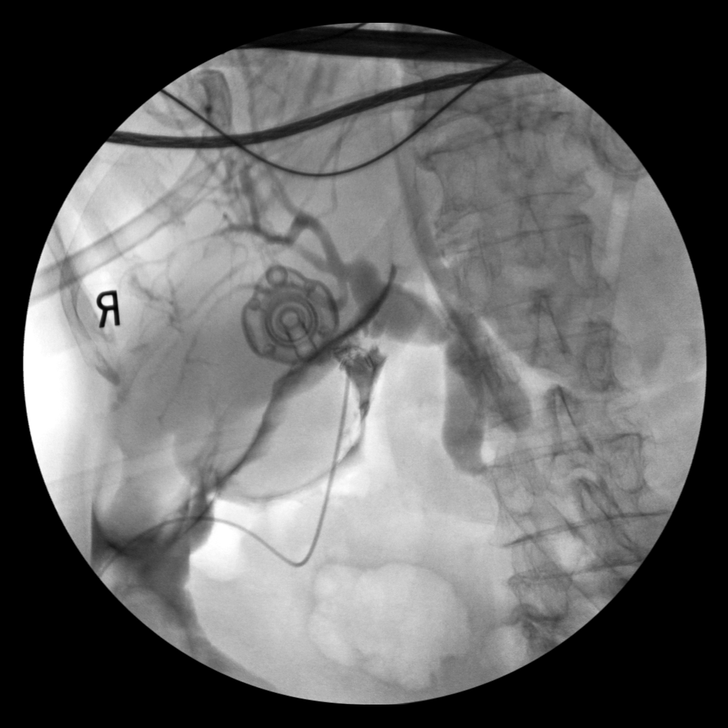
[im 2/4]
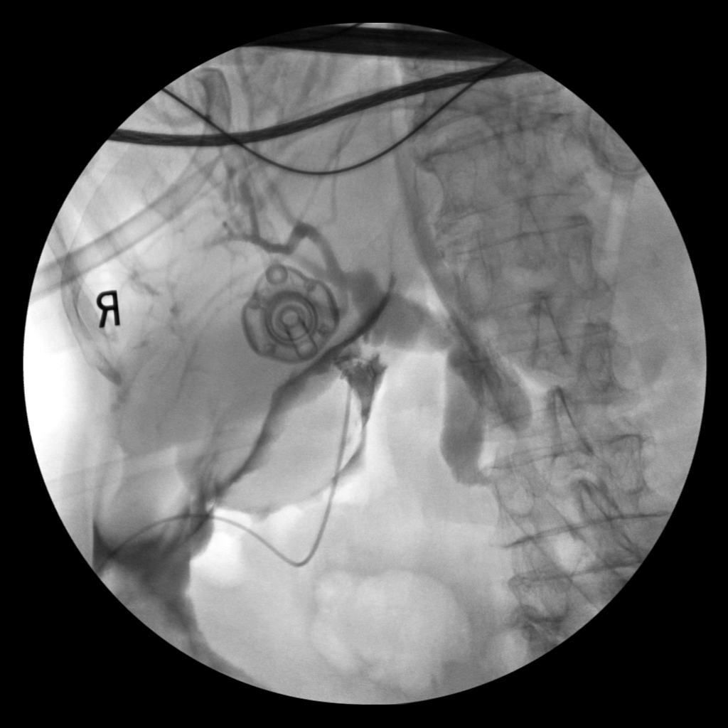
[im 2/4]
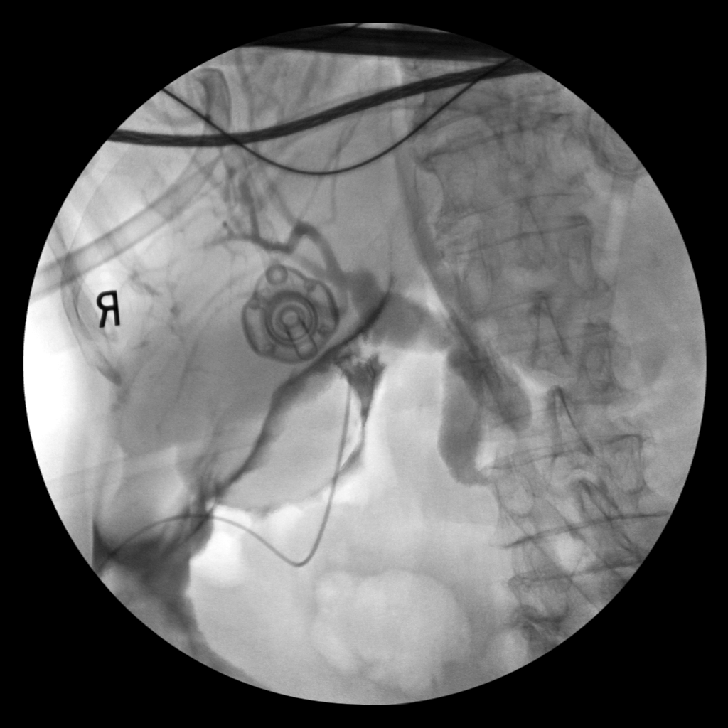
[im 2/4]
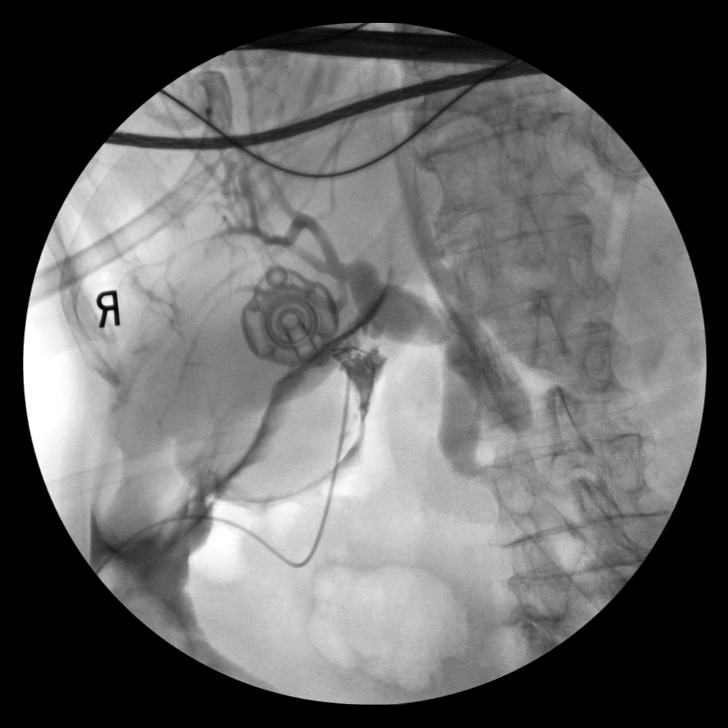
[im 3/4]
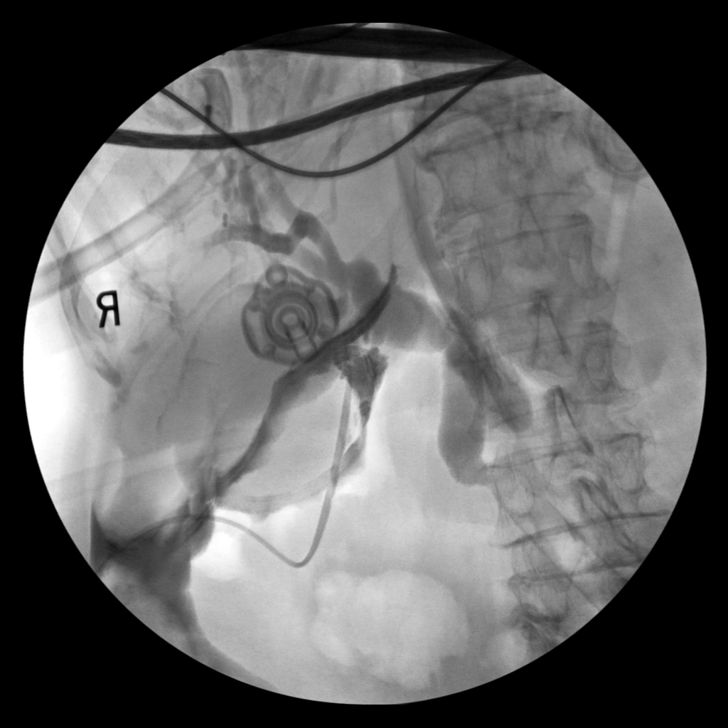
[im 4/4]
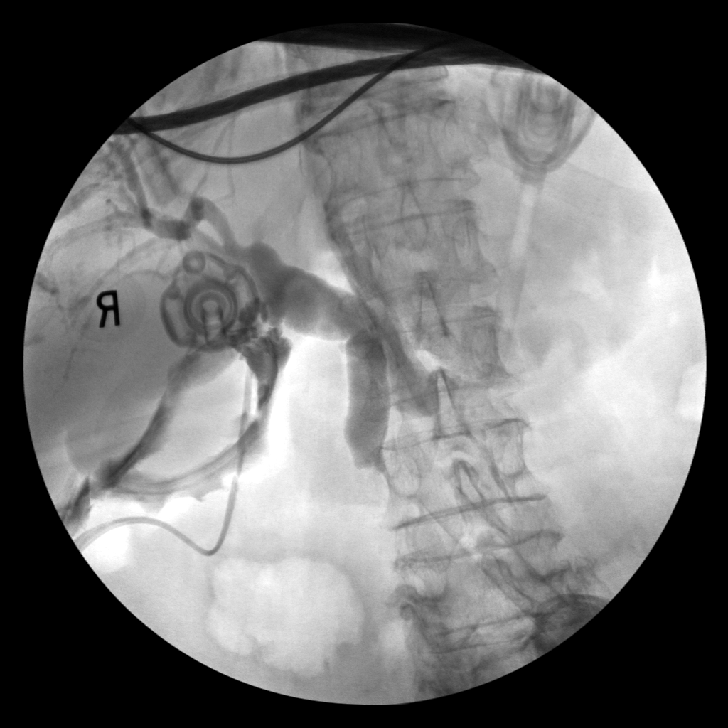

[10 of 10 positions shown; findings below may reference images not displayed]

FINDINGS: Multiple spot intraoperative cholangiographic images of the right
upper abdominal quadrant during laparoscopic cholecystectomy are
provided for review.

Surgical clips overlie the expected location of the gallbladder
fossa.

Contrast injection demonstrates selective cannulation of the central
aspect of the cystic duct.

There is extravasation of contrast about the cystic duct cannulation
site there is however passage of contrast through the central aspect
of the cystic duct with filling of a markedly dilated common bile
duct.

There is minimal reflux of injected contrast into the common hepatic
duct and central aspect of the mildly dilated intrahepatic biliary
system.

There is no definitive passage of contrast to the level of the
distal aspect of the CBD. There is no definitive opacification of
the duodenum.
IMPRESSION: No definitive passage of contrast through the distal aspect of the
CBD - while potentially technique related, an ampullary
stenosis/occlusion or occlusive choledocholithiasis could result in
a similar appearance. Further evaluation and management with ERCP
could be performed indicated.

## 2022-12-28 IMAGING — RF DG ERCP WO/W SPHINCTEROTOMY
1 series · 13 of 13 positions shown · non-contrast
Comparison: None.

CLINICAL DATA: ERCP

EXAM:
ERCP
TECHNIQUE: Multiple spot images obtained with the fluoroscopic device and
submitted for interpretation post-procedure.
FLUOROSCOPY TIME:  Refer to procedure report

[Series 1: unknown protocol · 0.20mm/px · 13 of 13 slices shown]
[im 1/13]
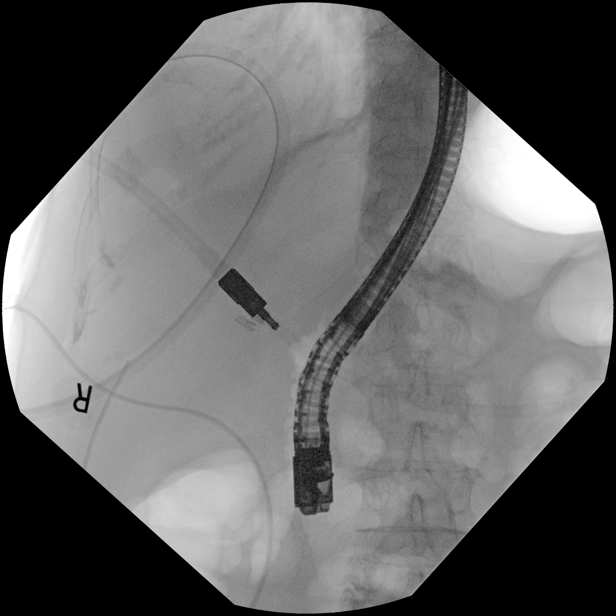
[im 2/13]
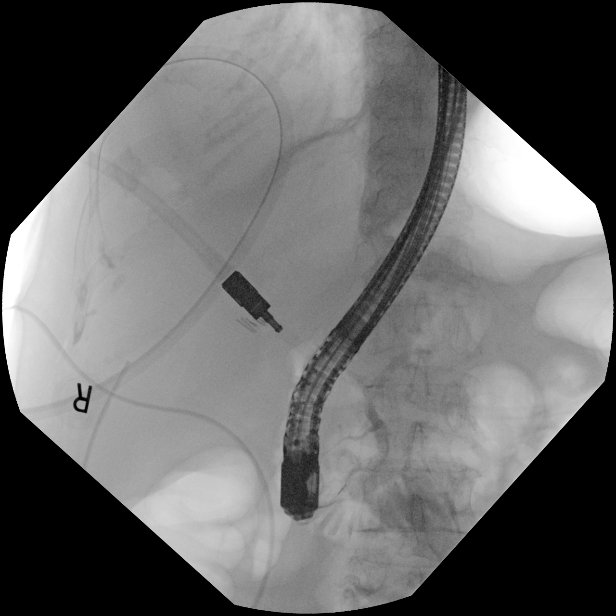
[im 3/13]
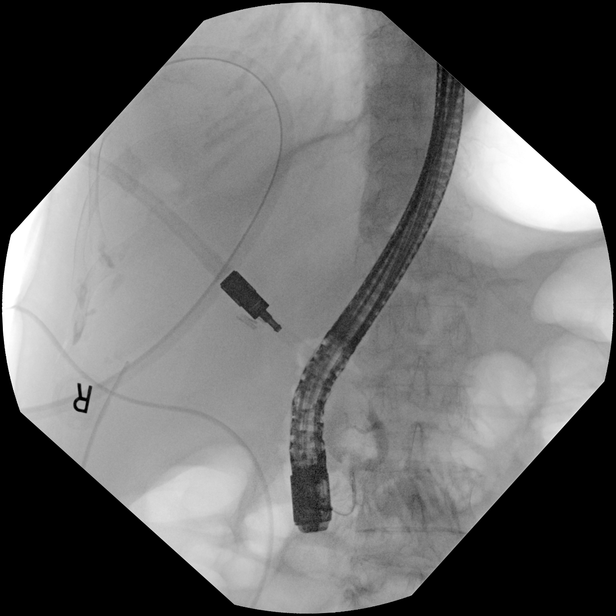
[im 4/13]
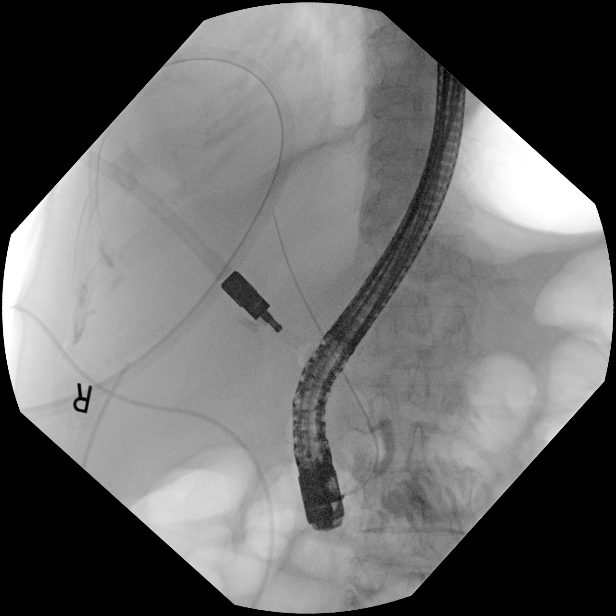
[im 5/13]
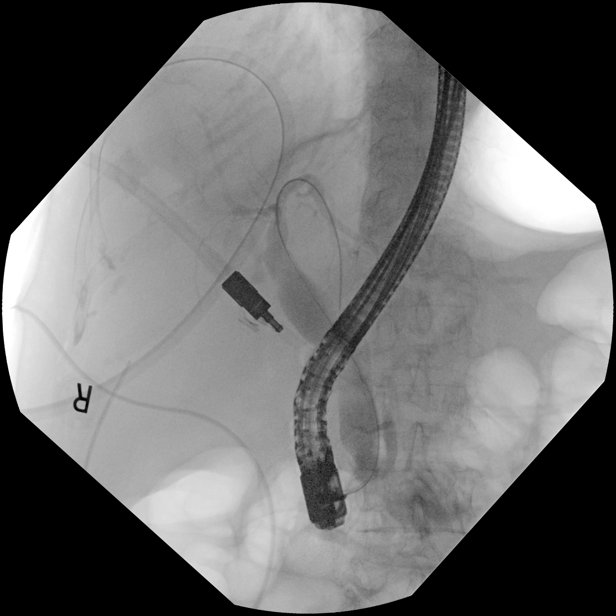
[im 6/13]
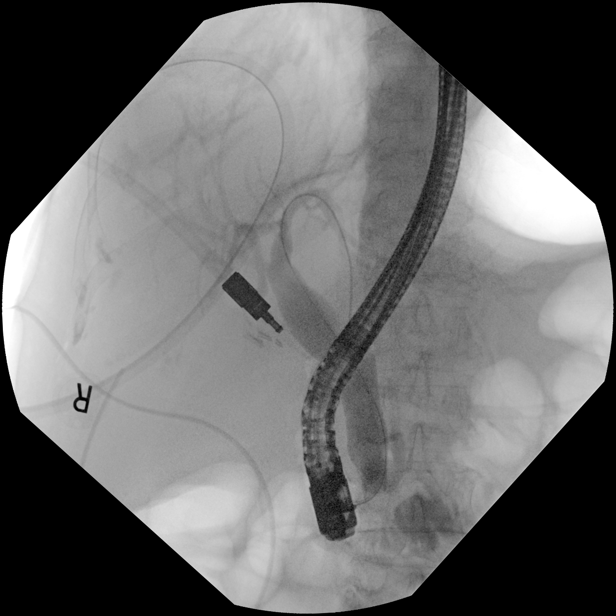
[im 7/13]
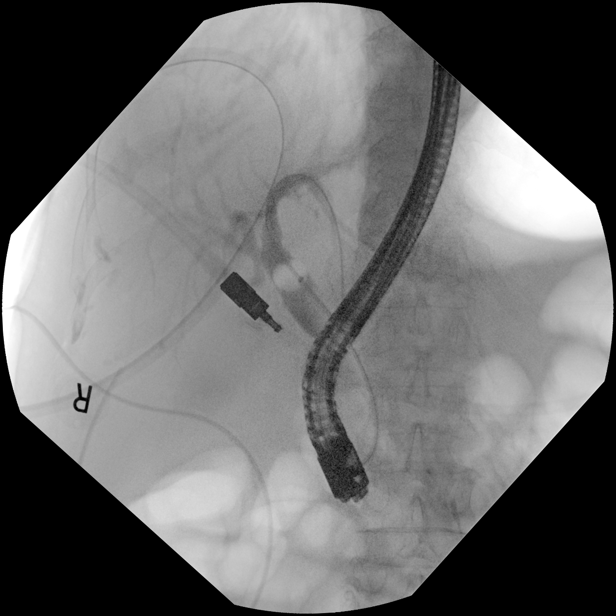
[im 8/13]
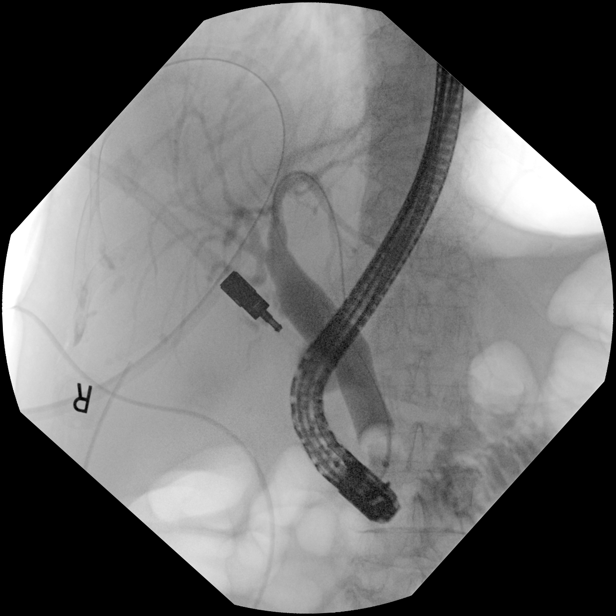
[im 9/13]
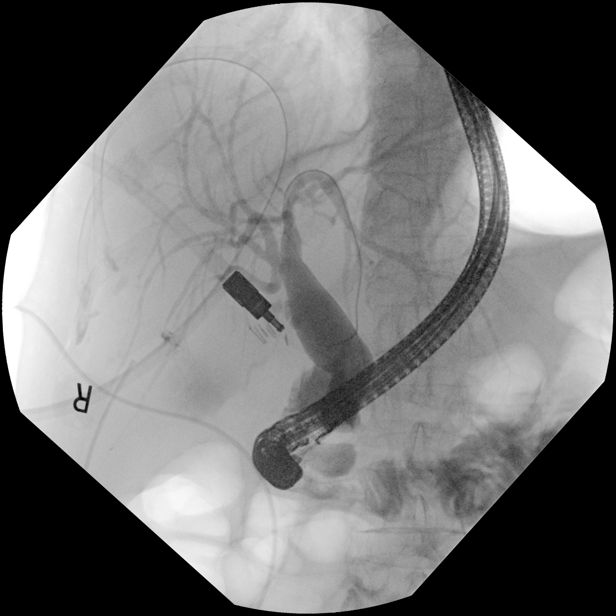
[im 10/13]
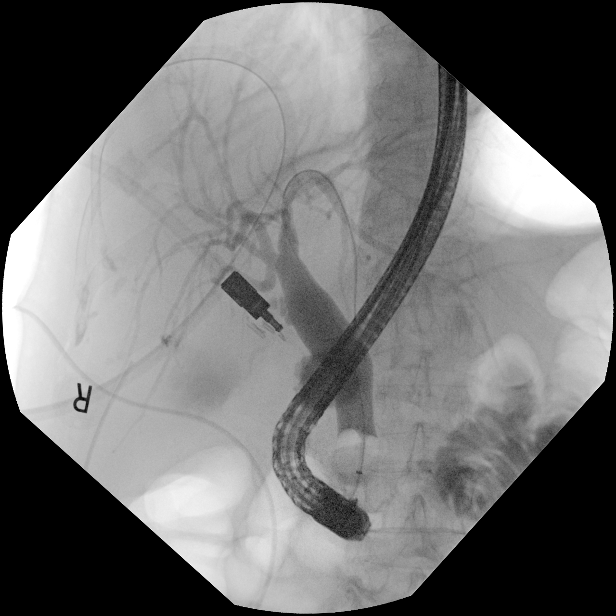
[im 11/13]
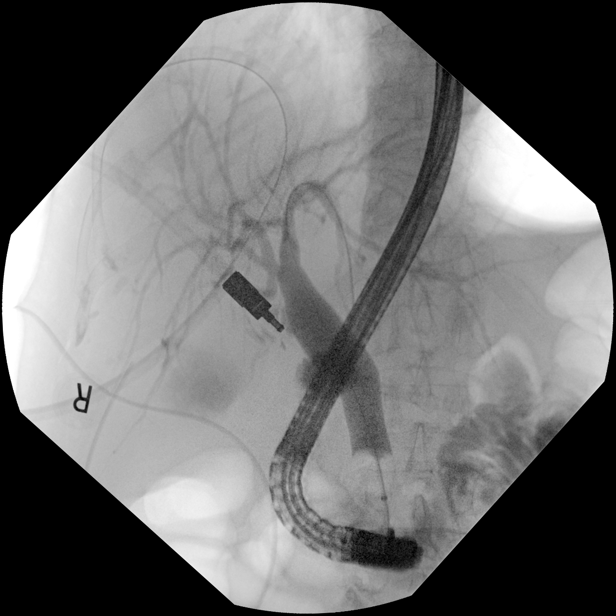
[im 12/13]
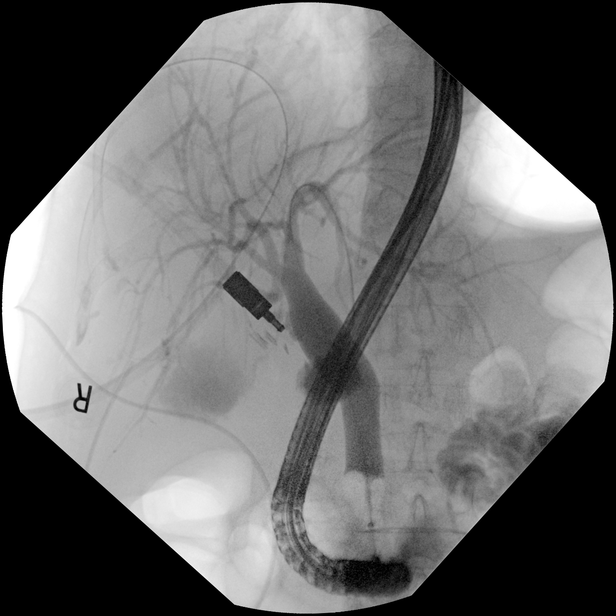
[im 13/13]
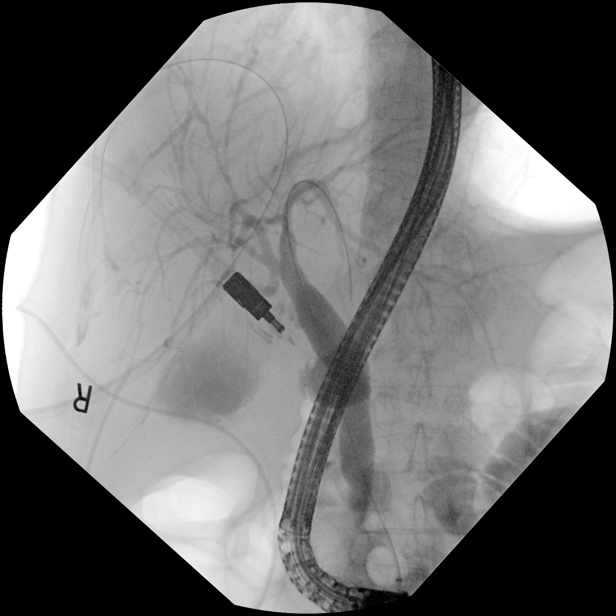

[13 of 13 positions shown; findings below may reference images not displayed]

FINDINGS: A total of 13 fluoroscopic spot films are submitted for review taken
during ERCP. Initial images demonstrate a scope overlying the right
upper quadrant. Wire catheterization of the common bile duct is
performed, followed by contrast injection. Cholangiography
demonstrates a dilated common bile duct, with no obvious filling
defects identified. Balloon sweeps of the common bile duct is then
performed. Final images demonstrate normal intrahepatic biliary tree
with persistently dilated common bile duct and no filling defects
identified.
IMPRESSION: Fluoroscopic images taken during ERCP as described.

These images were submitted for radiologic interpretation only.
Please see the procedural report for the amount of contrast and the
fluoroscopy time utilized.

## 2023-01-26 ENCOUNTER — Encounter: Payer: Medicare Other | Admitting: Student

## 2023-02-12 ENCOUNTER — Other Ambulatory Visit: Payer: Self-pay | Admitting: Student

## 2023-02-12 ENCOUNTER — Ambulatory Visit (INDEPENDENT_AMBULATORY_CARE_PROVIDER_SITE_OTHER): Payer: Medicare Other | Admitting: Student

## 2023-02-12 ENCOUNTER — Encounter: Payer: Self-pay | Admitting: Student

## 2023-02-12 VITALS — BP 120/70 | HR 75 | Ht 60.0 in | Wt 131.4 lb

## 2023-02-12 DIAGNOSIS — Z0000 Encounter for general adult medical examination without abnormal findings: Secondary | ICD-10-CM | POA: Diagnosis not present

## 2023-02-12 DIAGNOSIS — Z23 Encounter for immunization: Secondary | ICD-10-CM

## 2023-02-12 DIAGNOSIS — F03918 Unspecified dementia, unspecified severity, with other behavioral disturbance: Secondary | ICD-10-CM

## 2023-02-12 DIAGNOSIS — F05 Delirium due to known physiological condition: Secondary | ICD-10-CM

## 2023-02-12 DIAGNOSIS — D539 Nutritional anemia, unspecified: Secondary | ICD-10-CM | POA: Diagnosis not present

## 2023-02-12 NOTE — Patient Instructions (Signed)
Health Maintenance After Age 85 Glad to hear you are doing well overall. Blood pressure today looks great. We gave you your flu vaccine today. I have placed to check your B12 and blood count level.  Please make sure to schedule an appointment to come next Friday to have this lab drawn. After age 62, you are at a higher risk for certain long-term diseases and infections as well as injuries from falls. Falls are a major cause of broken bones and head injuries in people who are older than age 34. Getting regular preventive care can help to keep you healthy and well. Preventive care includes getting regular testing and making lifestyle changes as recommended by your health care provider. Talk with your health care provider about: Which screenings and tests you should have. A screening is a test that checks for a disease when you have no symptoms. A diet and exercise plan that is right for you. What should I know about screenings and tests to prevent falls? Screening and testing are the best ways to find a health problem early. Early diagnosis and treatment give you the best chance of managing medical conditions that are common after age 16. Certain conditions and lifestyle choices may make you more likely to have a fall. Your health care provider may recommend: Regular vision checks. Poor vision and conditions such as cataracts can make you more likely to have a fall. If you wear glasses, make sure to get your prescription updated if your vision changes. Medicine review. Work with your health care provider to regularly review all of the medicines you are taking, including over-the-counter medicines. Ask your health care provider about any side effects that may make you more likely to have a fall. Tell your health care provider if any medicines that you take make you feel dizzy or sleepy. Strength and balance checks. Your health care provider may recommend certain tests to check your strength and balance  while standing, walking, or changing positions. Foot health exam. Foot pain and numbness, as well as not wearing proper footwear, can make you more likely to have a fall. Screenings, including: Osteoporosis screening. Osteoporosis is a condition that causes the bones to get weaker and break more easily. Blood pressure screening. Blood pressure changes and medicines to control blood pressure can make you feel dizzy. Depression screening. You may be more likely to have a fall if you have a fear of falling, feel depressed, or feel unable to do activities that you used to do. Alcohol use screening. Using too much alcohol can affect your balance and may make you more likely to have a fall. Follow these instructions at home: Lifestyle Do not drink alcohol if: Your health care provider tells you not to drink. If you drink alcohol: Limit how much you have to: 0-1 drink a day for women. 0-2 drinks a day for men. Know how much alcohol is in your drink. In the [place] one drink equals one 12 oz bottle of beer (355 mL), one 5 oz glass of wine (148 mL), or one 1 oz glass of hard liquor (44 mL). Do not use any products that contain nicotine or tobacco. These products include cigarettes, chewing tobacco, and vaping devices, such as e-cigarettes. If you need help quitting, ask your health care provider. Activity  Follow a regular exercise program to stay fit. This will help you maintain your balance. Ask your health care provider what types of exercise are appropriate for you. If you need a cane  or walker, use it as recommended by your health care provider. Wear supportive shoes that have nonskid soles. Safety  Remove any tripping hazards, such as rugs, cords, and clutter. Install safety equipment such as grab bars in bathrooms and safety rails on stairs. Keep rooms and walkways well-lit. General instructions Talk with your health care provider about your risks for falling. Tell your health care provider  if: You fall. Be sure to tell your health care provider about all falls, even ones that seem minor. You feel dizzy, tiredness (fatigue), or off-balance. Take over-the-counter and prescription medicines only as told by your health care provider. These include supplements. Eat a healthy diet and maintain a healthy weight. A healthy diet includes low-fat dairy products, low-fat (lean) meats, and fiber from whole grains, beans, and lots of fruits and vegetables. Stay current with your vaccines. Schedule regular health, dental, and eye exams. Summary Having a healthy lifestyle and getting preventive care can help to protect your health and wellness after age 72. Screening and testing are the best way to find a health problem early and help you avoid having a fall. Early diagnosis and treatment give you the best chance for managing medical conditions that are more common for people who are older than age 38. Falls are a major cause of broken bones and head injuries in people who are older than age 45. Take precautions to prevent a fall at home. Work with your health care provider to learn what changes you can make to improve your health and wellness and to prevent falls. This information is not intended to replace advice given to you by your health care provider. Make sure you discuss any questions you have with your health care provider. Document Revised: 07/29/2020 Document Reviewed: 07/29/2020 Elsevier Patient Education  2024 [employer].

## 2023-02-12 NOTE — Progress Notes (Signed)
Annual Wellness Visit     Patient: Ebony Pierce, Female    DOB: [birth year], 87 y.o.   MRN: [id number]  Subjective  Chief Complaint  Patient presents with   Annual Exam    Ebony Pierce is a 87 y.o. female who presents today for her Annual Wellness Visit.  Diet: Eat regular diet and appetite is good Sleep: occasionally poor sleep but currently on Mirtazipine  Exercise: walks around the house daily Alcohol use:  None Tobacco use: None Illicit drug use: None Lives with: Daughter and son inlaw Code Status: Undecided Ebony Pierce (Daughter) Power of Attorney:  Medical concerns: No medical concerns but daughter will like to check Vitamin B12. Of note daughter report mom has mild dementia.    Medications: Outpatient Medications Prior to Visit  Medication Sig   acetaminophen (TYLENOL) 500 MG tablet Take 2 tablets (1,000 mg total) by mouth every 6 (six) hours as needed.   [employer] B-12 500 MCG tablet TAKE 2 TABLETS BY MOUTH EVERY DAY (Patient not taking: Reported on 06/12/2022)   Famotidine-Ca Carb-Mag Hydrox (PEPCID COMPLETE PO) Take 1 tablet by mouth daily as needed (stomach). (Patient not taking: Reported on 06/12/2022)   Melatonin 5 MG CAPS Take 1-2 capsules (5-10 mg total) by mouth at bedtime. (Patient taking differently: Take 10 capsules by mouth at bedtime.)   mirtazapine (REMERON) 7.5 MG tablet TAKE 1 TABLET BY MOUTH EVERYDAY AT BEDTIME   MULTIPLE VITAMIN PO Take 1 tablet by mouth daily.   No facility-administered medications prior to visit.    Allergies  Allergen Reactions   Shellfish Allergy Swelling and Rash    Patient Care Team: Jerre Simon, MD as PCP - General (Family Medicine)  Review of Systems  Constitutional: Negative.   HENT: Negative.    Eyes: Negative.         history of cataracts which sometimes affects her vision.  Respiratory: Negative.    Cardiovascular: Negative.   Gastrointestinal: Negative.         Objective  BP 120/70   Pulse 75   Ht  5' (1.524 m)   Wt 131 lb 6.4 oz (59.6 kg)   BMI 25.66 kg/m   Physical Exam Constitutional:      Appearance: Normal appearance. She is normal weight.  HENT:     Head: Normocephalic and atraumatic.     Right Ear: Tympanic membrane normal.     Left Ear: Tympanic membrane normal.     Mouth/Throat:     Mouth: Mucous membranes are moist.  Eyes:     Extraocular Movements: Extraocular movements intact.     Conjunctiva/sclera: Conjunctivae normal.     Pupils: Pupils are equal, round, and reactive to light.  Cardiovascular:     Rate and Rhythm: Normal rate and regular rhythm.     Pulses: Normal pulses.     Heart sounds: Normal heart sounds.  Pulmonary:     Effort: Pulmonary effort is normal.     Breath sounds: Normal breath sounds.  Abdominal:     General: Abdomen is flat. Bowel sounds are normal.     Palpations: Abdomen is soft.  Musculoskeletal:     Cervical back: Normal range of motion and neck supple.  Skin:    General: Skin is warm and dry.     Capillary Refill: Capillary refill takes less than 2 seconds.  Neurological:     General: No focal deficit present.     Mental Status: She is alert.  Psychiatric:  Mood and Affect: Mood normal.    Assessment & Plan   87 year old female fairly healthy with no medical concerns today. Here for annual wellness visit.  Given her history of anemia and B12 deficiency we will obtain labs for B12 and CBC. -Future lab for B12 and CBC ordered patient expressed preference for labs to be collected in a week.  Appointment scheduled   Annual wellness visit done today including the all of the following: Reviewed patient's Family Medical History Reviewed and updated list of patient's medical providers Assessment of cognitive impairment was done Assessed patient's functional ability Established a written schedule for health screening services Health Risk Assessent Completed and Reviewed   Immunization History  Administered Date(s)  Administered   Fluad Quad(high Dose 65+) 01/16/2022   Fluad Trivalent(High Dose 65+) 02/12/2023   Influenza,inj,Quad PF,6+ Mos 06/13/2015, 05/25/2018, 01/03/2019, 05/13/2020   PFIZER(Purple Top)SARS-COV-2 Vaccination 05/05/2019, 05/30/2019, 02/12/2020   Pneumococcal Conjugate-13 10/19/2016   Pneumococcal Polysaccharide-23 05/25/2018   Tdap 05/13/2020    Health Maintenance  Topic Date Due   COVID-19 Vaccine (4 - 2023-24 season) 02/28/2023 (Originally 11/22/2022)   Zoster Vaccines- Shingrix (1 of 2) 01/24/2024 (Originally 07/07/1987)   DEXA SCAN  02/12/2024 (Originally 07/07/2002)   Medicare Annual Wellness (AWV)  06/12/2023   DTaP/Tdap/Td (2 - Td or Tdap) 05/13/2030   Pneumonia Vaccine 70+ Years old  Completed   INFLUENZA VACCINE  Completed   HPV VACCINES  Aged Out     Discussed health benefits of physical activity, and encouraged her to engage in regular exercise appropriate for her age and condition.    Problem List Items Addressed This Visit       Other   Macrocytic anemia - Primary   Relevant Orders   Vitamin B12   CBC   Other Visit Diagnoses     Routine general medical examination at a health care facility       Encounter for immunization       Relevant Orders   Flu Vaccine Trivalent High Dose (Fluad) (Completed)   Need for shingles vaccine           No follow-ups on file.     Jerre Simon, MD

## 2023-02-16 ENCOUNTER — Other Ambulatory Visit: Payer: Medicare Other

## 2023-02-16 DIAGNOSIS — D539 Nutritional anemia, unspecified: Secondary | ICD-10-CM | POA: Diagnosis not present

## 2023-02-17 LAB — VITAMIN B12: Vitamin B-12: 187 pg/mL — ABNORMAL LOW (ref 232–1245)

## 2023-06-28 ENCOUNTER — Ambulatory Visit (INDEPENDENT_AMBULATORY_CARE_PROVIDER_SITE_OTHER): Payer: Medicare Other

## 2023-06-28 VITALS — Ht 60.0 in | Wt 132.0 lb

## 2023-06-28 DIAGNOSIS — Z0000 Encounter for general adult medical examination without abnormal findings: Secondary | ICD-10-CM

## 2023-06-28 NOTE — Progress Notes (Signed)
 Because this visit was a virtual/telehealth visit,  certain criteria was not obtained, such a blood pressure, CBG if applicable, and timed get up and go. Any medications not marked as "taking" were not mentioned during the medication reconciliation part of the visit. Any vitals not documented were not able to be obtained due to this being a telehealth visit or patient was unable to self-report a recent blood pressure reading due to a lack of equipment at home via telehealth. Vitals that have been documented are verbally provided by the patient. Daughter provider weight for patient during this visit.  Subjective:   Ebony Pierce is a 88 y.o. who presents for a Medicare Wellness preventive visit.  Visit Complete: Virtual I connected with  Raoul Pitch on 06/28/23 by a audio enabled telemedicine application and verified that I am speaking with the correct person using two identifiers.  Patient Location: Home  Provider Location: Office/Clinic  I discussed the limitations of evaluation and management by telemedicine. The patient expressed understanding and agreed to proceed.  Vital Signs: Because this visit was a virtual/telehealth visit, some criteria may be missing or patient reported. Any vitals not documented were not able to be obtained and vitals that have been documented are patient reported.  VideoDeclined- This patient declined Librarian, academic. Therefore the visit was completed with audio only.  Persons Participating in Visit:  [name] (daughter per [facility]) and patient was present during visit.  AWV Questionnaire: No: Patient Medicare AWV questionnaire was not completed prior to this visit.  Cardiac Risk Factors include: advanced age (>98men, >42 women);sedentary lifestyle     Objective:    Today's Vitals   06/28/23 0919  Weight: 132 lb (59.9 kg)  Height: 5' (1.524 m)  PainSc: 0-No pain   Body mass index is 25.78 kg/m.     06/28/2023    9:22  AM 06/26/2022    3:42 PM 06/12/2022    8:43 AM 01/16/2022    2:30 PM 08/27/2021    4:15 PM 02/17/2021   12:22 AM 02/13/2021    7:04 PM  Advanced Directives  Does Patient Have a Medical Advance Directive? No No No No No No No  Does patient want to make changes to medical advance directive?      No - Patient declined No - Patient declined  Would patient like information on creating a medical advance directive? No - Patient declined No - Patient declined   No - Patient declined No - Patient declined No - Patient declined    Current Medications (verified) Outpatient Encounter Medications as of 06/28/2023  Medication Sig   acetaminophen (TYLENOL) 500 MG tablet Take 2 tablets (1,000 mg total) by mouth every 6 (six) hours as needed.   Melatonin 5 MG CAPS Take 1-2 capsules (5-10 mg total) by mouth at bedtime. (Patient taking differently: Take 10 capsules by mouth at bedtime.)   mirtazapine (REMERON) 7.5 MG tablet TAKE 1 TABLET BY MOUTH EVERYDAY AT BEDTIME   MULTIPLE VITAMIN PO Take 1 tablet by mouth daily.   [employer] B-12 500 MCG tablet TAKE 2 TABLETS BY MOUTH EVERY DAY (Patient not taking: Reported on 06/12/2022)   Famotidine-Ca Carb-Mag Hydrox (PEPCID COMPLETE PO) Take 1 tablet by mouth daily as needed (stomach). (Patient not taking: Reported on 06/12/2022)   No facility-administered encounter medications on file as of 06/28/2023.    Allergies (verified) Shellfish allergy   History: Past Medical History:  Diagnosis Date   Age-related hearing loss 10/19/2016   Cataract  s/p R cataract surgery   Dementia with behavioral disturbance (HCC) 07/02/2015   MoCA 10/30 (Administered with assistance of [place] interpreter in exam room (11/12/16)   Reduced vision 09/16/2016   Weight loss, non-intentional 04/14/2018   Past Surgical History:  Procedure Laterality Date   CATARACT EXTRACTION Right 2012   CHOLECYSTECTOMY N/A 02/18/2021   Procedure: LAPAROSCOPIC CHOLECYSTECTOMY WITH INTRAOPERATIVE CHOLANGIOGRAM;   Surgeon: Sheliah Hatch De Blanch, MD;  Location: [facility] OR;  Service: General;  Laterality: N/A;   ENDOSCOPIC RETROGRADE CHOLANGIOPANCREATOGRAPHY (ERCP) WITH PROPOFOL N/A 02/20/2021   Procedure: ENDOSCOPIC RETROGRADE CHOLANGIOPANCREATOGRAPHY (ERCP) WITH PROPOFOL;  Surgeon: Hilarie Fredrickson, MD;  Location: [facility] ENDOSCOPY;  Service: Endoscopy;  Laterality: N/A;   IR RADIOLOGIST EVAL & MGMT  03/03/2021   IR US GUIDE BX ASP/DRAIN  02/27/2021   IR US GUIDE BX ASP/DRAIN  02/27/2021   SPHINCTEROTOMY  02/20/2021   Procedure: Dennison Mascot;  Surgeon: Hilarie Fredrickson, MD;  Location: [facility] ENDOSCOPY;  Service: Endoscopy;;   Family History  Problem Relation Age of Onset   Hypertension Mother    Social History   Socioeconomic History   Marital status: Widowed    Spouse name: Not on file   Number of children: 4   Years of education: 35   Highest education level: Not on file  Occupational History   Occupation: Retired  Tobacco Use   Smoking status: Never   Smokeless tobacco: Never  Vaping Use   Vaping status: Never Used  Substance and Sexual Activity   Alcohol use: No    Alcohol/week: 0.0 standard drinks of alcohol   Drug use: No   Sexual activity: Not Currently  Other Topics Concern   Not on file  Social History Narrative   Lives at home with daughter and son-in-law.    Enjoys Retail banker and walking     Primary family support persons is daughter who is also her paid Merchandiser, retail.    Transportation to appointments provided by daughter [name].   Receives the following community support services SSI, food stamps and CAPS services.       Social Drivers of Corporate investment banker Strain: Low Risk  (06/28/2023)   Overall Financial Resource Strain (CARDIA)    Difficulty of Paying Living Expenses: Not hard at all  Food Insecurity: No Food Insecurity (06/28/2023)   Hunger Vital Sign    Worried About Running Out of Food in the Last Year: Never true    Ran Out of Food in the Last Year: Never true  Transportation  Needs: No Transportation Needs (06/28/2023)   PRAPARE - Administrator, Civil Service (Medical): No    Lack of Transportation (Non-Medical): No  Physical Activity: Inactive (06/28/2023)   Exercise Vital Sign    Days of Exercise per Week: 0 days    Minutes of Exercise per Session: 0 min  Stress: No Stress Concern Present (06/28/2023)   [employer] of Occupational Health - Occupational Stress Questionnaire    Feeling of Stress : Not at all  Social Connections: Socially Isolated (06/28/2023)   Social Connection and Isolation Panel [NHANES]    Frequency of Communication with Friends and Family: Twice a week    Frequency of Social Gatherings with Friends and Family: More than three times a week    Attends Religious Services: Never    Database administrator or Organizations: No    Attends Banker Meetings: Never    Marital Status: Widowed    Tobacco Counseling Counseling given: Not  Answered    Clinical Intake:  Pre-visit preparation completed: Yes  Pain : No/denies pain Pain Score: 0-No pain     BMI - recorded: 25.78 Nutritional Status: BMI 25 -29 Overweight Nutritional Risks: None Diabetes: No  Lab Results  Component Value Date   HGBA1C 5.8 (A) 08/27/2021     How often do you need to have someone help you when you read instructions, pamphlets, or other written materials from your doctor or pharmacy?: 1 - Never  Interpreter Needed?: No  Information entered by :: Chadd Tollison N. Macaila Tahir, LPN.   Activities of Daily Living     06/28/2023    9:26 AM  In your present state of health, do you have any difficulty performing the following activities:  Hearing? 1  Vision? 1  Difficulty concentrating or making decisions? 1  Walking or climbing stairs? 1  Dressing or bathing? 1  Doing errands, shopping? 1  Preparing Food and eating ? N  Using the Toilet? Y  In the past six months, have you accidently leaked urine? N  Do you have problems with loss of  bowel control? N  Managing your Medications? Y  Managing your Finances? Y  Housekeeping or managing your Housekeeping? Y    Patient Care Team: Jerre Simon, MD as PCP - General (Family Medicine) Shea Evans Genice Rouge as Consulting Physician (Optometry)  Indicate any recent Medical Services you may have received from other than [facility] providers in the past year (date may be approximate).     Assessment:   This is a routine wellness examination for [facility].  Hearing/Vision screen Hearing Screening - Comments:: Patient has difficulty hearing. No hearing aids. Patient needs referral for hearing consult. Vision Screening - Comments:: Wears rx glasses - up to date with routine eye exams with London Sheer, OD. Patient has issues with vision.    Goals Addressed             This Visit's Progress    Client understands the importance of follow-up with providers by attending scheduled visits         Depression Screen     06/28/2023    9:27 AM 06/26/2022    3:42 PM 06/12/2022    8:18 AM 06/11/2022   10:29 AM 01/16/2022    3:10 PM 01/16/2022    2:30 PM 08/27/2021    4:17 PM  PHQ 2/9 Scores  PHQ - 2 Score  3 0 3 3 3 2   PHQ- 9 Score  12  8 11 11 8   Exception Documentation Other- indicate reason in comment box        Not completed Patient unable to complete/perform due to cogintive impairment/dementia.          Fall Risk     06/28/2023    9:24 AM 06/26/2022    3:42 PM 06/12/2022    8:19 AM 06/11/2022   10:29 AM 07/11/2020    2:06 PM  Fall Risk   Falls in the past year? 0 0 0 0 0  Number falls in past yr: 0  0 0 0  Injury with Fall? 0  0 0   Risk for fall due to : No Fall Risks  No Fall Risks    Follow up Falls prevention discussed;Falls evaluation completed  Falls evaluation completed      MEDICARE RISK AT HOME:  Medicare Risk at Home Any stairs in or around the home?: Yes If so, are there any without handrails?: No Home free of loose throw rugs  in walkways, pet beds, electrical cords,  etc?: Yes Adequate lighting in your home to reduce risk of falls?: Yes Life alert?: No Use of a cane, walker or w/c?: No Grab bars in the bathroom?: No Shower chair or bench in shower?: Yes Elevated toilet seat or a handicapped toilet?: No  TIMED UP AND GO:  Was the test performed?  No  Cognitive Function: Declined: Patient declined cognitive screening, but was able to answer questions in an accurate and timely manner. No cognitive impairments observed.    06/28/2023    9:26 AM  MMSE - Mini Mental State Exam  Not completed: Unable to complete      11/13/2016   12:49 PM  Montreal Cognitive Assessment   Visuospatial/ Executive (0/5) 1  Naming (0/3) 1  Attention: Read list of digits (0/2) 2  Attention: Read list of letters (0/1) 0  Attention: Serial 7 subtraction starting at 100 (0/3) 1  Language: Repeat phrase (0/2) 2  Language : Fluency (0/1) 0  Abstraction (0/2) 0  Delayed Recall (0/5) 0  Orientation (0/6) 2  Total 9  Adjusted Score (based on education) 10      Immunizations Immunization History  Administered Date(s) Administered   Fluad Quad(high Dose 65+) 01/16/2022   Fluad Trivalent(High Dose 65+) 02/12/2023   Influenza,inj,Quad PF,6+ Mos 06/13/2015, 05/25/2018, 01/03/2019, 05/13/2020   PFIZER(Purple Top)SARS-COV-2 Vaccination 05/05/2019, 05/30/2019, 02/12/2020   Pneumococcal Conjugate-13 10/19/2016   Pneumococcal Polysaccharide-23 05/25/2018   Tdap 05/13/2020   Zoster Recombinant(Shingrix) 10/24/2022    Screening Tests Health Maintenance  Topic Date Due   COVID-19 Vaccine (4 - 2024-25 season) 11/22/2022   Zoster Vaccines- Shingrix (2 of 2) 01/24/2024 (Originally 12/19/2022)   DEXA SCAN  02/12/2024 (Originally 07/07/2002)   INFLUENZA VACCINE  10/22/2023   Medicare Annual Wellness (AWV)  06/27/2024   DTaP/Tdap/Td (2 - Td or Tdap) 05/13/2030   Pneumonia Vaccine 76+ Years old  Completed   HPV VACCINES  Aged Out    Health Maintenance  Health Maintenance Due   Topic Date Due   COVID-19 Vaccine (4 - 2024-25 season) 11/22/2022   Health Maintenance Items Addressed: See Nurse Notes  Additional Screening:  Vision Screening: Recommended annual ophthalmology exams for early detection of glaucoma and other disorders of the eye.  Dental Screening: Recommended annual dental exams for proper oral hygiene  Community Resource Referral / Chronic Care Management: CRR required this visit?  No   CCM required this visit?  No     Plan:     I have personally reviewed and noted the following in the patient's chart:   Medical and social history Use of alcohol, tobacco or illicit drugs  Current medications and supplements including opioid prescriptions. Patient is not currently taking opioid prescriptions. Functional ability and status Nutritional status Physical activity Advanced directives List of other physicians Hospitalizations, surgeries, and ER visits in previous 12 months Vitals Screenings to include cognitive, depression, and falls Referrals and appointments  In addition, I have reviewed and discussed with patient certain preventive protocols, quality metrics, and best practice recommendations. A written personalized care plan for preventive services as well as general preventive health recommendations were provided to patient.     Mickeal Needy, LPN   03/28/1094   After Visit Summary: (MyChart) Due to this being a telephonic visit, the after visit summary with patients personalized plan was offered to patient via MyChart   Notes: Please refer to Routing Comments.

## 2023-06-28 NOTE — Patient Instructions (Addendum)
 Ebony Pierce , Thank you for taking time to come for your Medicare Wellness Visit. I appreciate your ongoing commitment to your health goals. Please review the following plan we discussed and let me know if I can assist you in the future.   Referrals/Orders/Follow-Ups/Clinician Recommendations: Message was sent to Dr. Elliot Gurney for an order for the following: wheelchair, life alert, shower bar and consult for a hearing evaluation.  Keep maintaining your health by keeping your appointments with Dr. Elliot Gurney and any specialists that you may see.  Call us if you need anything.  Have a great year!!!!  This is a list of the screening recommended for you and due dates:  Health Maintenance  Topic Date Due   COVID-19 Vaccine (4 - 2024-25 season) 11/22/2022   Zoster (Shingles) Vaccine (2 of 2) 01/24/2024*   DEXA scan (bone density measurement)  02/12/2024*   Flu Shot  10/22/2023   Medicare Annual Wellness Visit  06/27/2024   DTaP/Tdap/Td vaccine (2 - Td or Tdap) 05/13/2030   Pneumonia Vaccine  Completed   HPV Vaccine  Aged Out  *Topic was postponed. The date shown is not the original due date.    Advanced directives: (Declined) Advance directive discussed with you today. Even though you declined this today, please call our office should you change your mind, and we can give you the proper paperwork for you to fill out.  Next Medicare Annual Wellness Visit scheduled for next year: Yes, 06/29/2024 at 9:50 am phone visit only.

## 2023-07-12 ENCOUNTER — Ambulatory Visit: Admitting: Student

## 2023-08-08 ENCOUNTER — Other Ambulatory Visit: Payer: Self-pay | Admitting: Student

## 2023-08-08 DIAGNOSIS — F03918 Unspecified dementia, unspecified severity, with other behavioral disturbance: Secondary | ICD-10-CM

## 2023-08-08 DIAGNOSIS — F05 Delirium due to known physiological condition: Secondary | ICD-10-CM

## 2023-10-27 ENCOUNTER — Other Ambulatory Visit: Payer: Self-pay | Admitting: Family Medicine

## 2023-10-27 ENCOUNTER — Ambulatory Visit (INDEPENDENT_AMBULATORY_CARE_PROVIDER_SITE_OTHER): Admitting: Family Medicine

## 2023-10-27 ENCOUNTER — Encounter: Payer: Self-pay | Admitting: Family Medicine

## 2023-10-27 VITALS — BP 139/73 | HR 83 | Wt 127.4 lb

## 2023-10-27 DIAGNOSIS — N3941 Urge incontinence: Secondary | ICD-10-CM

## 2023-10-27 LAB — POCT URINALYSIS DIP (MANUAL ENTRY)
Bilirubin, UA: NEGATIVE
Blood, UA: NEGATIVE
Glucose, UA: NEGATIVE mg/dL
Ketones, POC UA: NEGATIVE mg/dL
Leukocytes, UA: NEGATIVE
Nitrite, UA: NEGATIVE
Protein Ur, POC: NEGATIVE mg/dL
Spec Grav, UA: 1.015 (ref 1.010–1.025)
Urobilinogen, UA: 0.2 U/dL
pH, UA: 8 (ref 5.0–8.0)

## 2023-10-27 MED ORDER — VIBEGRON 75 MG PO TABS: 75.0000 mg | ORAL_TABLET | Freq: Every day | ORAL | 0 refills | Status: DC

## 2023-10-27 NOTE — Assessment & Plan Note (Addendum)
 Likely age-related with pelvic floor dysfunction.  Progressive over time.  Urinalysis today negative for UTI.  Her age, in addition to labs within the last year without hyperglycemia, reassuring against the development of diabetes mellitus.  Will send in DME order for incontinence briefs as well as Chux pads for her bed.  Attempted to trial vibegron  75 mg daily given patient's borderline blood pressure today; however insurance prefers mirabegron. Will start at lowest dose, 25 mg daily. Come back in 1 month or sooner if needed.

## 2023-10-27 NOTE — Progress Notes (Addendum)
    SUBJECTIVE:   CHIEF COMPLAINT / HPI:   Urinary incontinence Has been peeing herself at night for 5 days because she cannot make it into the bathroom in time. Their SW recommended coming to see us  for evaluation and incontinence supplies. She does not have any dysuria. No pelvic pain. No significant odor to the urine. She has had 4 vaginal deliveries. There is some documentation of incontinence in 2020, and her daughter mentions that this is worse than ever. She only had one episode of stool incontinence last year but none now.  PERTINENT  PMH / PSH: Orthostasis, dementia, age-related hearing loss and vision difficulties, biliary obstruction  OBJECTIVE:   BP 139/73   Pulse 83   Wt 127 lb 6.4 oz (57.8 kg)   SpO2 94%   BMI 24.88 kg/m   General: Alert and oriented, in NAD Skin: Warm, dry, and intact HEENT: NCAT, EOM grossly normal, midline nasal septum Cardiac: RRR, no m/r/g appreciated Respiratory: CTAB, breathing and speaking comfortably on RA Abdominal: Soft, nontender, nondistended, normoactive bowel sounds Extremities: Moves all extremities grossly equally Neurological: No gross focal deficit Psychiatric: Appropriate mood and affect   ASSESSMENT/PLAN:   Assessment & Plan Urge incontinence of urine Likely age-related with pelvic floor dysfunction.  Progressive over time.  Urinalysis today negative for UTI.  Her age, in addition to labs within the last year without hyperglycemia, reassuring against the development of diabetes mellitus.  Will send in DME order for incontinence briefs as well as Chux pads for her bed.  Attempted to trial vibegron  75 mg daily given patient's borderline blood pressure today; however insurance prefers mirabegron. Will start at lowest dose, 25 mg daily. Come back in 1 month or sooner if needed.   Stuart Redo, MD [facility]

## 2023-10-27 NOTE — Patient Instructions (Signed)
 I sent in a medication called Vibegron  to take once daily. Let me know if you have any side effects from this medication or if it does not work. I will also get incontinence supplies for you.  We collected a urine sample today.  Come back for follow up in 1 month or sooner if needed.

## 2023-11-04 ENCOUNTER — Telehealth: Payer: Self-pay

## 2023-11-04 NOTE — Telephone Encounter (Signed)
 Incontinence supplies ordered for patient.   Community message sent to [employer] for processing.

## 2023-11-21 ENCOUNTER — Other Ambulatory Visit: Payer: Self-pay | Admitting: Family Medicine

## 2023-11-23 ENCOUNTER — Other Ambulatory Visit: Payer: Self-pay

## 2023-11-24 ENCOUNTER — Other Ambulatory Visit: Payer: Self-pay

## 2023-11-26 ENCOUNTER — Ambulatory Visit (INDEPENDENT_AMBULATORY_CARE_PROVIDER_SITE_OTHER)

## 2023-11-26 ENCOUNTER — Telehealth: Payer: Self-pay

## 2023-11-26 ENCOUNTER — Other Ambulatory Visit: Payer: Self-pay

## 2023-11-26 VITALS — BP 128/77 | HR 77 | Wt 125.2 lb

## 2023-11-26 DIAGNOSIS — N3941 Urge incontinence: Secondary | ICD-10-CM | POA: Diagnosis not present

## 2023-11-26 DIAGNOSIS — M25562 Pain in left knee: Secondary | ICD-10-CM | POA: Diagnosis not present

## 2023-11-26 MED ORDER — DICLOFENAC SODIUM 1 % EX GEL: 2.0000 g | Freq: Four times a day (QID) | CUTANEOUS | 3 refills | Status: AC

## 2023-11-26 MED ORDER — DICLOFENAC SODIUM 1 % EX GEL: 2.0000 g | Freq: Four times a day (QID) | CUTANEOUS | 1 refills | Status: DC | PRN

## 2023-11-26 NOTE — Patient Instructions (Signed)
 It was great to see you today!  Today we talked about your mother's incontinence and her knee pain. I'm very glad to hear that her Gemtesa  has reduced her peeing episodes so significantly, let's continue with that medicine.   For her knee pain, I'd like you to apply the voltaren  gel (also called diclofenac ) 3-4 times per day. This will help with the pain she's feeling and hopefully allow her to be more mobile, reducing the number of times she has to be pee before arriving at the commode.   We will work on getting you the diapers and supplies for her bed as well.   Please bring ALL of your medications with you to your next visit.   Please follow up in 2 months or sooner as needed.   Please schedule your follow up visit at the front desk before you leave today.   Thank you for choosing [facility].   Please call [phone] with any questions about today's appointment.  Leafy Scriver, DO Family Medicine

## 2023-11-26 NOTE — Progress Notes (Signed)
    SUBJECTIVE:   CHIEF COMPLAINT / HPI:   Here for f/u of incontinence. Daughter reports they've been taking gemtesa . She'll have about 1/4 the number of episodes as before the medication which is encouraging. Will have an episode every couple of days or so. She does have trouble with mobility, daughter says she has L knee pain and possible limp when she gets up to walk after resting for a while. Daughter feels that pt gets benefit from the gemtesa , feels that her incontinence at this point is largely 2/2 amount of time it takes her to get to the toilet. Patient feels that she has control of her bladder but that report is confounded by her dementia, according to daughter she does have episodes of peeing on the floor or in her underwear every few days.  Patient has a BSC but hasn't been consistent w/ using it so far.   They are still waiting for incontinence supplies from prior visit.    OBJECTIVE:   BP 128/77   Pulse 77   Wt 125 lb 3.2 oz (56.8 kg)   SpO2 99%   BMI 24.45 kg/m   General: Awake, alert, NAD. HEENT: NCAT. MMM, clear OP w/ no exudates or erythema.  Cardio: RRR. Normal S1, S2. No murmur, rub, gallop. 2+ radial and dorsalis pedis pulses b/l w/ good capillary refill. No LE edema.  Resp: CTA bilaterally. No wheezes, rales, or rhonchi. Normal work of breathing on room air Extremities: L knee w/ mild TTP over medial joint line and positive mcmurray test with discomfort in same area. R knee w/o TTP on exam. Both knees with mildly reduced ROM in F/E and intact ligamentous testing. No erythema or swelling. No deformity.  Skin: good skin turgor, no rash or lesions appreciated, no abnormal nevi  ASSESSMENT/PLAN:   Assessment & Plan Urge incontinence of urine -Patient has had 1/4 the number of incontinence episodes since starting Gemtesa . Mobility appears to be the challenge at this point, as patient struggles to move quickly to the toilet and doesn't yet use the [facility] consistently.   Continue Gemtesa  75mg  daily Continue to encourage use of bed side commode F/u on incontinence supplies that are as of yet not obtained Left medial knee pain Daughter appreciates a limp when pt walks after resting for a long time. Exam suggestive of medial compartment arthritis.  Voltaren  gel 4x daily for L knee pain contributing to incontinence Handicap placard provided    Leafy Scriver, DO [facility]

## 2023-11-26 NOTE — Telephone Encounter (Signed)
 Patients daughter dropped off form at front desk for disability parking.  Verified that patient section of form has been completed.  Last DOS/WCC with PCP was 10/27/2023.  Placed form in green team folder to be completed by clinical staff.  Chiquita POUR [place]

## 2023-11-26 NOTE — Assessment & Plan Note (Addendum)
-  Patient has had 1/4 the number of incontinence episodes since starting Gemtesa . Mobility appears to be the challenge at this point, as patient struggles to move quickly to the toilet and doesn't yet use the [facility] consistently.  Continue Gemtesa  75mg  daily Continue to encourage use of bed side commode F/u on incontinence supplies that are as of yet not obtained

## 2023-11-26 NOTE — Telephone Encounter (Signed)
 Reviewed form and placed in PCP's box for completion.  Cena JONELLE Pesa, CMA

## 2023-11-29 NOTE — Telephone Encounter (Signed)
Patient's daughter called and informed that forms are ready for pick up. Copy made and placed in batch scanning. Original placed at front desk for pick up.   Hannah C Pipkin, RN  

## 2023-12-02 ENCOUNTER — Encounter: Payer: Self-pay | Admitting: Family Medicine

## 2023-12-03 MED ORDER — MIRABEGRON ER 25 MG PO TB24: 25.0000 mg | ORAL_TABLET | Freq: Every day | ORAL | 1 refills | Status: DC

## 2023-12-30 ENCOUNTER — Other Ambulatory Visit: Payer: Self-pay

## 2024-01-20 ENCOUNTER — Other Ambulatory Visit: Payer: Self-pay | Admitting: Student

## 2024-01-20 DIAGNOSIS — F03918 Unspecified dementia, unspecified severity, with other behavioral disturbance: Secondary | ICD-10-CM

## 2024-01-20 DIAGNOSIS — F05 Delirium due to known physiological condition: Secondary | ICD-10-CM

## 2024-04-22 ENCOUNTER — Encounter

## 2024-04-24 ENCOUNTER — Other Ambulatory Visit: Payer: Self-pay

## 2024-04-24 ENCOUNTER — Emergency Department (HOSPITAL_BASED_OUTPATIENT_CLINIC_OR_DEPARTMENT_OTHER)

## 2024-04-24 ENCOUNTER — Encounter (HOSPITAL_BASED_OUTPATIENT_CLINIC_OR_DEPARTMENT_OTHER): Payer: Self-pay

## 2024-04-24 ENCOUNTER — Inpatient Hospital Stay (HOSPITAL_BASED_OUTPATIENT_CLINIC_OR_DEPARTMENT_OTHER)
Admission: EM | Admit: 2024-04-24 | Source: Home / Self Care | Attending: Internal Medicine | Admitting: Internal Medicine

## 2024-04-24 DIAGNOSIS — R410 Disorientation, unspecified: Principal | ICD-10-CM

## 2024-04-24 DIAGNOSIS — E871 Hypo-osmolality and hyponatremia: Secondary | ICD-10-CM

## 2024-04-24 DIAGNOSIS — K5904 Chronic idiopathic constipation: Secondary | ICD-10-CM

## 2024-04-24 DIAGNOSIS — K5900 Constipation, unspecified: Secondary | ICD-10-CM

## 2024-04-24 DIAGNOSIS — F0390 Unspecified dementia without behavioral disturbance: Secondary | ICD-10-CM | POA: Insufficient documentation

## 2024-04-24 DIAGNOSIS — L0390 Cellulitis, unspecified: Secondary | ICD-10-CM

## 2024-04-24 DIAGNOSIS — G9340 Encephalopathy, unspecified: Secondary | ICD-10-CM | POA: Diagnosis present

## 2024-04-24 DIAGNOSIS — R509 Fever, unspecified: Secondary | ICD-10-CM

## 2024-04-24 DIAGNOSIS — L130 Dermatitis herpetiformis: Secondary | ICD-10-CM | POA: Diagnosis present

## 2024-04-24 LAB — URINALYSIS, ROUTINE W REFLEX MICROSCOPIC
Bacteria, UA: NONE SEEN
Bilirubin Urine: NEGATIVE
Glucose, UA: NEGATIVE mg/dL
Ketones, ur: NEGATIVE mg/dL
Nitrite: NEGATIVE
Specific Gravity, Urine: 1.023 (ref 1.005–1.030)
pH: 6.5 (ref 5.0–8.0)

## 2024-04-24 LAB — COMPREHENSIVE METABOLIC PANEL WITH GFR
ALT: 19 U/L (ref 0–44)
AST: 34 U/L (ref 15–41)
Albumin: 4.1 g/dL (ref 3.5–5.0)
Alkaline Phosphatase: 76 U/L (ref 38–126)
Anion gap: 12 (ref 5–15)
BUN: 18 mg/dL (ref 8–23)
CO2: 25 mmol/L (ref 22–32)
Calcium: 9.8 mg/dL (ref 8.9–10.3)
Chloride: 97 mmol/L — ABNORMAL LOW (ref 98–111)
Creatinine, Ser: 0.73 mg/dL (ref 0.44–1.00)
GFR, Estimated: >60 mL/min
Glucose, Bld: 100 mg/dL — ABNORMAL HIGH (ref 70–99)
Potassium: 3.8 mmol/L (ref 3.5–5.1)
Sodium: 133 mmol/L — ABNORMAL LOW (ref 135–145)
Total Bilirubin: 0.4 mg/dL (ref 0.0–1.2)
Total Protein: 7.3 g/dL (ref 6.5–8.1)

## 2024-04-24 LAB — CBC WITH DIFFERENTIAL/PLATELET
Abs Immature Granulocytes: 0.02 10*3/uL (ref 0.00–0.07)
Basophils Absolute: 0 10*3/uL (ref 0.0–0.1)
Basophils Relative: 1 %
Eosinophils Absolute: 0 10*3/uL (ref 0.0–0.5)
Eosinophils Relative: 0 %
HCT: 43 % (ref 36.0–46.0)
Hemoglobin: 14.2 g/dL (ref 12.0–15.0)
Immature Granulocytes: 0 %
Lymphocytes Relative: 35 %
Lymphs Abs: 1.7 10*3/uL (ref 0.7–4.0)
MCH: 29.4 pg (ref 26.0–34.0)
MCHC: 33 g/dL (ref 30.0–36.0)
MCV: 89 fL (ref 80.0–100.0)
Monocytes Absolute: 0.7 10*3/uL (ref 0.1–1.0)
Monocytes Relative: 14 %
Neutro Abs: 2.5 10*3/uL (ref 1.7–7.7)
Neutrophils Relative %: 50 %
Platelets: 207 10*3/uL (ref 150–400)
RBC: 4.83 MIL/uL (ref 3.87–5.11)
RDW: 13.2 % (ref 11.5–15.5)
Smear Review: NORMAL
WBC: 5 10*3/uL (ref 4.0–10.5)
nRBC: 0 % (ref 0.0–0.2)

## 2024-04-24 LAB — LACTIC ACID, PLASMA: Lactic Acid, Venous: 1.8 mmol/L (ref 0.5–1.9)

## 2024-04-24 LAB — RESP PANEL BY RT-PCR (RSV, FLU A&B, COVID)  RVPGX2
Influenza A by PCR: NEGATIVE
Influenza B by PCR: NEGATIVE
Resp Syncytial Virus by PCR: NEGATIVE
SARS Coronavirus 2 by RT PCR: NEGATIVE

## 2024-04-24 LAB — LIPASE, BLOOD: Lipase: 21 U/L (ref 11–51)

## 2024-04-24 MED ORDER — IOHEXOL 300 MG/ML  SOLN
80.0000 mL | Freq: Once | INTRAMUSCULAR | Status: AC | PRN
  Administered 2024-04-24: 100 mL via INTRAVENOUS

## 2024-04-24 MED ORDER — SMOG ENEMA: 960.0000 mL | Freq: Once | RECTAL | Status: DC

## 2024-04-24 MED ORDER — PIPERACILLIN-TAZOBACTAM 3.375 G IVPB 30 MIN
3.3750 g | Freq: Once | INTRAVENOUS | Status: AC
  Administered 2024-04-24: 3.375 g via INTRAVENOUS
  Filled 2024-04-24: qty 50

## 2024-04-24 MED ORDER — POLYETHYLENE GLYCOL 3350 17 G PO PACK
17.0000 g | PACK | Freq: Every day | ORAL | Status: AC
  Administered 2024-04-24 – 2024-04-28 (×4): 17 g via ORAL
  Filled 2024-04-24 (×5): qty 1

## 2024-04-24 MED ORDER — BISACODYL 10 MG RE SUPP
10.0000 mg | Freq: Once | RECTAL | Status: AC
  Administered 2024-04-24: 10 mg via RECTAL
  Filled 2024-04-24: qty 1

## 2024-04-24 MED ORDER — MINERAL OIL RE ENEM
1.0000 | ENEMA | Freq: Once | RECTAL | Status: AC
  Administered 2024-04-24: 1 via RECTAL
  Filled 2024-04-24: qty 1

## 2024-04-25 DIAGNOSIS — L0390 Cellulitis, unspecified: Secondary | ICD-10-CM

## 2024-04-25 DIAGNOSIS — N3281 Overactive bladder: Secondary | ICD-10-CM

## 2024-04-25 DIAGNOSIS — B029 Zoster without complications: Secondary | ICD-10-CM | POA: Diagnosis not present

## 2024-04-25 DIAGNOSIS — K5900 Constipation, unspecified: Secondary | ICD-10-CM

## 2024-04-25 DIAGNOSIS — R410 Disorientation, unspecified: Principal | ICD-10-CM

## 2024-04-25 DIAGNOSIS — F03B0 Unspecified dementia, moderate, without behavioral disturbance, psychotic disturbance, mood disturbance, and anxiety: Secondary | ICD-10-CM

## 2024-04-25 DIAGNOSIS — L130 Dermatitis herpetiformis: Secondary | ICD-10-CM | POA: Diagnosis present

## 2024-04-25 DIAGNOSIS — E871 Hypo-osmolality and hyponatremia: Secondary | ICD-10-CM

## 2024-04-25 LAB — CBC
HCT: 41.1 % (ref 36.0–46.0)
Hemoglobin: 13.4 g/dL (ref 12.0–15.0)
MCH: 29.1 pg (ref 26.0–34.0)
MCHC: 32.6 g/dL (ref 30.0–36.0)
MCV: 89.2 fL (ref 80.0–100.0)
Platelets: 200 10*3/uL (ref 150–400)
RBC: 4.61 MIL/uL (ref 3.87–5.11)
RDW: 13.3 % (ref 11.5–15.5)
WBC: 6.2 10*3/uL (ref 4.0–10.5)
nRBC: 0 % (ref 0.0–0.2)

## 2024-04-25 LAB — BASIC METABOLIC PANEL WITH GFR
Anion gap: 10 (ref 5–15)
BUN: 15 mg/dL (ref 8–23)
CO2: 23 mmol/L (ref 22–32)
Calcium: 9.3 mg/dL (ref 8.9–10.3)
Chloride: 101 mmol/L (ref 98–111)
Creatinine, Ser: 0.76 mg/dL (ref 0.44–1.00)
GFR, Estimated: >60 mL/min
Glucose, Bld: 107 mg/dL — ABNORMAL HIGH (ref 70–99)
Potassium: 4 mmol/L (ref 3.5–5.1)
Sodium: 134 mmol/L — ABNORMAL LOW (ref 135–145)

## 2024-04-25 LAB — AMMONIA: Ammonia: 50 umol/L — ABNORMAL HIGH (ref 9–35)

## 2024-04-25 LAB — VITAMIN B12: Vitamin B-12: <150 pg/mL — ABNORMAL LOW (ref 180–914)

## 2024-04-25 LAB — TSH: TSH: 1.59 u[IU]/mL (ref 0.350–4.500)

## 2024-04-25 MED ORDER — HALOPERIDOL LACTATE 5 MG/ML IJ SOLN
2.0000 mg | Freq: Four times a day (QID) | INTRAMUSCULAR | Status: AC | PRN
  Administered 2024-04-25 – 2024-04-28 (×5): 2 mg via INTRAVENOUS
  Filled 2024-04-25 (×5): qty 1

## 2024-04-25 MED ORDER — BISACODYL 10 MG RE SUPP: 10.0000 mg | Freq: Every day | RECTAL | Status: DC | PRN

## 2024-04-25 MED ORDER — SENNOSIDES-DOCUSATE SODIUM 8.6-50 MG PO TABS: 1.0000 | ORAL_TABLET | Freq: Two times a day (BID) | ORAL | Status: DC

## 2024-04-25 MED ORDER — MIRABEGRON ER 25 MG PO TB24
25.0000 mg | ORAL_TABLET | Freq: Every day | ORAL | Status: AC
  Administered 2024-04-25 – 2024-04-28 (×4): 25 mg via ORAL
  Filled 2024-04-25 (×4): qty 1

## 2024-04-25 MED ORDER — MIRTAZAPINE 15 MG PO TABS
7.5000 mg | ORAL_TABLET | Freq: Every evening | ORAL | Status: AC
  Administered 2024-04-25 – 2024-04-28 (×4): 7.5 mg via ORAL
  Filled 2024-04-25 (×5): qty 1

## 2024-04-25 MED ORDER — MELATONIN 5 MG PO TABS
5.0000 mg | ORAL_TABLET | Freq: Every day | ORAL | Status: AC
  Administered 2024-04-25 – 2024-04-28 (×4): 5 mg via ORAL
  Filled 2024-04-25 (×4): qty 1

## 2024-04-25 MED ORDER — SODIUM CHLORIDE 0.9 % IV SOLN: INTRAVENOUS | Status: DC

## 2024-04-25 MED ORDER — HYDROCODONE-ACETAMINOPHEN 5-325 MG PO TABS
1.0000 | ORAL_TABLET | ORAL | Status: AC | PRN
  Administered 2024-04-25 – 2024-04-28 (×5): 1 via ORAL
  Filled 2024-04-25 (×5): qty 1

## 2024-04-25 MED ORDER — DEXTROSE 5 % IV SOLN
500.0000 mg | Freq: Two times a day (BID) | INTRAVENOUS | Status: DC
  Administered 2024-04-25 – 2024-04-28 (×6): 500 mg via INTRAVENOUS
  Filled 2024-04-25 (×6): qty 10

## 2024-04-25 MED ORDER — ENOXAPARIN SODIUM 40 MG/0.4ML IJ SOSY
40.0000 mg | PREFILLED_SYRINGE | INTRAMUSCULAR | Status: AC
  Administered 2024-04-25 – 2024-04-28 (×4): 40 mg via SUBCUTANEOUS
  Filled 2024-04-25 (×4): qty 0.4

## 2024-04-25 MED ORDER — ACETAMINOPHEN 650 MG RE SUPP: 650.0000 mg | Freq: Four times a day (QID) | RECTAL | Status: AC | PRN

## 2024-04-25 MED ORDER — ACETAMINOPHEN 325 MG PO TABS
650.0000 mg | ORAL_TABLET | Freq: Four times a day (QID) | ORAL | Status: AC | PRN
  Administered 2024-04-26: 650 mg via ORAL
  Filled 2024-04-25: qty 2

## 2024-04-25 MED ORDER — BISACODYL 10 MG RE SUPP
10.0000 mg | Freq: Once | RECTAL | Status: AC
  Administered 2024-04-25: 10 mg via RECTAL
  Filled 2024-04-25: qty 1

## 2024-04-25 MED ORDER — CYANOCOBALAMIN 1000 MCG/ML IJ SOLN
1000.0000 ug | Freq: Every day | INTRAMUSCULAR | Status: AC
  Administered 2024-04-26 – 2024-04-28 (×3): 1000 ug via SUBCUTANEOUS
  Filled 2024-04-25 (×3): qty 1

## 2024-04-25 MED ORDER — PIPERACILLIN-TAZOBACTAM 3.375 G IVPB
3.3750 g | Freq: Three times a day (TID) | INTRAVENOUS | Status: DC
  Administered 2024-04-25 (×2): 3.375 g via INTRAVENOUS
  Filled 2024-04-25 (×2): qty 50

## 2024-04-25 MED ORDER — SENNOSIDES-DOCUSATE SODIUM 8.6-50 MG PO TABS
2.0000 | ORAL_TABLET | Freq: Every day | ORAL | Status: AC
  Administered 2024-04-25 – 2024-04-28 (×4): 2 via ORAL
  Filled 2024-04-25 (×4): qty 2

## 2024-04-25 MED ORDER — BISACODYL 10 MG RE SUPP: 10.0000 mg | Freq: Every day | RECTAL | Status: AC | PRN

## 2024-04-25 MED ORDER — MORPHINE SULFATE (PF) 2 MG/ML IV SOLN
2.0000 mg | INTRAVENOUS | Status: AC | PRN
  Administered 2024-04-27 – 2024-04-28 (×3): 2 mg via INTRAVENOUS
  Filled 2024-04-25 (×3): qty 1

## 2024-04-25 NOTE — Progress Notes (Signed)
 PT Cancellation Note  Patient Details Name: Ebony Pierce MRN: [id number] DOB: 07-08-1935   Cancelled Treatment:    Reason Eval/Treat Not Completed: Medical issues which prohibited therapy. PT arrived 1033 and pt not appropriate for therapy at that time. Pt demonstrating perceived behaviors and currently in restraints and received Haldol  this am. PT to return when pt is appropriate to participate with therapy and to continue to follow acutely.   [place], PT Acute Rehab   [place] VEAR Drone 04/25/2024, 12:42 PM

## 2024-04-25 NOTE — Plan of Care (Signed)
 ?  Problem: Clinical Measurements: ?Goal: Ability to maintain clinical measurements within normal limits will improve ?Outcome: Progressing ?Goal: Will remain free from infection ?Outcome: Progressing ?Goal: Diagnostic test results will improve ?Outcome: Progressing ?  ?

## 2024-04-25 NOTE — Plan of Care (Signed)

## 2024-04-25 NOTE — Consult Note (Signed)
" °  CLINICAL SUPPORT TEAM - WOUND OSTOMY AND CONTINENCE TEAM  CONSULTATION SERVICES   WOC Nurse-Inpatient Note  WOC Nurse Consult Note: Reason for Consult: pt has right buttock rash to be evaluated and treatment plan- see pics in media  Wound type: infectious; blistered areas over the right buttock and down the right thigh; new onset. Fluid filled areas; concerning based on presentation for shingles.  Presenting on the one buttock and down the one thigh.  Pressure Injury POA: NA Measurement: NA Wound azi:aopduzmzi areas; some darkened area with underlying redness  Drainage (amount, consistency, odor) see nursing flowsheets Periwound: redness Dressing procedure/placement/frequency: Crust areas with ostomy powder to attempt to facilitate drying of the area.   Suggested to MD; placing on isolation; treatment systemically for Varicella; consider ID evaluation.    Re consult if needed, will not follow at this time. Thanks  Leyla Soliz [employer], RN,CWOCN, CNS, MAINE [phone]       "

## 2024-04-25 NOTE — ED Notes (Signed)
 [facility] in ED preparing pt for transfer

## 2024-04-25 NOTE — H&P (Addendum)
 " History and Physical    Ebony Pierce FMW:[id number] DOB: 1935-11-24 DOA: 04/24/2024  PCP: Manon Jester, DO  Patient coming from: [facility] ED  Chief Complaint: Altered mental status  HPI: Ebony Pierce is a 89 y.o. Thai speaking female with medical history significant of dementia, hearing loss, overactive bladder, GERD, B12 deficiency presented to the ED for evaluation of altered mental status and recent fall.  Patient also has a rash on her buttocks/right vulva which was evaluated by ED physician and there was no obvious concern for Fournier's gangrene.  No history of diabetes per family.  Patient noted to be febrile with temperature 100.6 F on arrival but remainder of vital signs stable.  Labs showing no leukocytosis or anemia, sodium 133, chloride 97, glucose 100, creatinine 0.73, normal lipase and LFTs, COVID/influenza/RSV PCR negative, lactic acid normal.  UA with negative nitrate, large amount of leukocytes, and microscopy showing 11-20 RBCs, 21-50 WBCs, and no bacteria.  Blood cultures in process.  Chest x-ray showing no acute cardiopulmonary findings.  CT abdomen pelvis showing subcutaneous fat stranding in the mons pubis extending into the right perineum concerning for cellulitis.  Also showing large stool burden in the sigmoid colon and rectum.  CT head/C-spine negative for acute findings.  Patient was given Dulcolax, mineral oil enema, MiraLAX , and Zosyn  in the ED.  Patient is not able to provide any history given her altered mental status/language barrier/hearing loss.  History provided by her daughter at bedside who is concerned that the patient has had increasing confusion since yesterday.  Daughter is also reporting patient having a rash on her buttocks/perineum.  Yesterday she felt warm at home.  Daughter states patient has not had a bowel movement in the past 3 days.  She has not vomited and her oral intake has been good.  Daughter is also reporting patient having generalized  weakness and a fall at home yesterday but she did not sustain any injuries or lose consciousness.  Review of Systems:  Review of Systems  All other systems reviewed and are negative.   Past Medical History:  Diagnosis Date   Age-related hearing loss 10/19/2016   Cataract    s/p R cataract surgery   Dementia with behavioral disturbance (HCC) 07/02/2015   [facility] 10/30 (Administered with assistance of Thai interpreter in exam room (11/12/16)   Reduced vision 09/16/2016   Weight loss, non-intentional 04/14/2018    Past Surgical History:  Procedure Laterality Date   CATARACT EXTRACTION Right 2012   CHOLECYSTECTOMY N/A 02/18/2021   Procedure: LAPAROSCOPIC CHOLECYSTECTOMY WITH INTRAOPERATIVE CHOLANGIOGRAM;  Surgeon: Stevie Herlene Righter, MD;  Location: [facility] OR;  Service: General;  Laterality: N/A;   ENDOSCOPIC RETROGRADE CHOLANGIOPANCREATOGRAPHY (ERCP) WITH PROPOFOL  N/A 02/20/2021   Procedure: ENDOSCOPIC RETROGRADE CHOLANGIOPANCREATOGRAPHY (ERCP) WITH PROPOFOL ;  Surgeon: Abran Norleen SAILOR, MD;  Location: [facility] ENDOSCOPY;  Service: Endoscopy;  Laterality: N/A;   IR RADIOLOGIST EVAL & MGMT  03/03/2021   IR US  GUIDE BX ASP/DRAIN  02/27/2021   IR US  GUIDE BX ASP/DRAIN  02/27/2021   SPHINCTEROTOMY  02/20/2021   Procedure: ANNETT;  Surgeon: Abran Norleen SAILOR, MD;  Location: [facility] ENDOSCOPY;  Service: Endoscopy;;     reports that she has never smoked. She has never used smokeless tobacco. She reports that she does not drink alcohol and does not use drugs.  Allergies[1]  Family History  Problem Relation Age of Onset   Hypertension Mother     Prior to Admission medications  Medication Sig Start Date End Date Taking? Authorizing Provider  acetaminophen  (TYLENOL ) 500 MG tablet Take 2 tablets (1,000 mg total) by mouth every 6 (six) hours as needed. 02/28/21   Tammy Sor, PA-C  [facility] B-12 500 MCG tablet TAKE 2 TABLETS BY MOUTH EVERY DAY Patient not taking: Reported on 06/12/2022 02/26/22   Marlee Lynwood NOVAK, MD  Famotidine-Ca Carb-Mag Hydrox (PEPCID COMPLETE PO) Take 1 tablet by mouth daily as needed (stomach). Patient not taking: Reported on 06/12/2022    [provider]  Melatonin 5 MG CAPS Take 1-2 capsules (5-10 mg total) by mouth at bedtime. Patient taking differently: Take 10 capsules by mouth at bedtime. 08/13/20   Cresenzo, John V, MD  mirabegron  ER (MYRBETRIQ ) 25 MG TB24 tablet TAKE 1 TABLET (25 MG TOTAL) BY MOUTH DAILY. 12/31/23   Manon Jester, DO  mirtazapine  (REMERON ) 7.5 MG tablet TAKE 1 TABLET BY MOUTH EVERYDAY AT BEDTIME 01/21/24   Manon Jester, DO  MULTIPLE VITAMIN PO Take 1 tablet by mouth daily.    [provider]    Physical Exam: Vitals:   04/24/24 2300 04/25/24 0000 04/25/24 0040 04/25/24 0143  BP: (!) 140/76 106/66  119/64  Pulse: 73   78  Resp:    18  Temp:   100.1 F (37.8 C) 99.8 F (37.7 C)  TempSrc:   Axillary Oral  SpO2: 94%   98%  Weight:      Height:        Physical Exam Vitals reviewed.  Constitutional:      General: She is not in acute distress. HENT:     Head: Normocephalic and atraumatic.  Eyes:     Extraocular Movements: Extraocular movements intact.  Cardiovascular:     Rate and Rhythm: Normal rate and regular rhythm.  Pulmonary:     Effort: Pulmonary effort is normal. No respiratory distress.     Breath sounds: Normal breath sounds.  Abdominal:     General: Bowel sounds are normal. There is no distension.     Palpations: Abdomen is soft.     Tenderness: There is no abdominal tenderness. There is no guarding.  Musculoskeletal:     Cervical back: Normal range of motion.     Right lower leg: No edema.     Left lower leg: No edema.  Skin:    General: Skin is warm and dry.  Neurological:     General: No focal deficit present.     Mental Status: She is alert.     Comments: Moving all extremities, no focal weakness     Labs on Admission: I have personally reviewed following labs and imaging  studies  CBC: Recent Labs  Lab 04/24/24 1846  WBC 5.0  NEUTROABS 2.5  HGB 14.2  HCT 43.0  MCV 89.0  PLT 207   Basic Metabolic Panel: Recent Labs  Lab 04/24/24 1846  NA 133*  K 3.8  CL 97*  CO2 25  GLUCOSE 100*  BUN 18  CREATININE 0.73  CALCIUM 9.8   GFR: Estimated Creatinine Clearance: 39.8 mL/min (by C-G formula based on SCr of 0.73 mg/dL). Liver Function Tests: Recent Labs  Lab 04/24/24 1846  AST 34  ALT 19  ALKPHOS 76  BILITOT 0.4  PROT 7.3  ALBUMIN  4.1   Recent Labs  Lab 04/24/24 1846  LIPASE 21   No results for input(s): AMMONIA in the last 168 hours. Coagulation Profile: No results for input(s): INR, PROTIME in the last 168 hours. Cardiac Enzymes: No results for input(s): CKTOTAL, CKMB, CKMBINDEX, TROPONINI in the last  168 hours. BNP (last 3 results) No results for input(s): PROBNP in the last 8760 hours. HbA1C: No results for input(s): HGBA1C in the last 72 hours. CBG: No results for input(s): GLUCAP in the last 168 hours. Lipid Profile: No results for input(s): CHOL, HDL, LDLCALC, TRIG, CHOLHDL, LDLDIRECT in the last 72 hours. Thyroid Function Tests: No results for input(s): TSH, T4TOTAL, FREET4, T3FREE, THYROIDAB in the last 72 hours. Anemia Panel: No results for input(s): VITAMINB12, FOLATE, FERRITIN, TIBC, IRON, RETICCTPCT in the last 72 hours. Urine analysis:    Component Value Date/Time   COLORURINE YELLOW 04/24/2024 1923   APPEARANCEUR CLEAR 04/24/2024 1923   LABSPEC 1.023 04/24/2024 1923   PHURINE 6.5 04/24/2024 1923   GLUCOSEU NEGATIVE 04/24/2024 1923   HGBUR MODERATE (A) 04/24/2024 1923   BILIRUBINUR NEGATIVE 04/24/2024 1923   BILIRUBINUR negative 10/27/2023 1110   KETONESUR NEGATIVE 04/24/2024 1923   PROTEINUR TRACE (A) 04/24/2024 1923   UROBILINOGEN 0.2 10/27/2023 1110   NITRITE NEGATIVE 04/24/2024 1923   LEUKOCYTESUR LARGE (A) 04/24/2024 1923    Radiological Exams  on Admission: CT Head Wo Contrast Result Date: 04/24/2024 CLINICAL DATA:  Fall. EXAM: CT HEAD WITHOUT CONTRAST TECHNIQUE: Contiguous axial images were obtained from the base of the skull through the vertex without intravenous contrast. RADIATION DOSE REDUCTION: This exam was performed according to the departmental dose-optimization program which includes automated exposure control, adjustment of the mA and/or kV according to patient size and/or use of iterative reconstruction technique. COMPARISON:  None Available. FINDINGS: Brain: No intracranial hemorrhage, mass effect, or midline shift. Age related atrophy. No hydrocephalus. Mild periventricular and deep white matter hypodensity typical of chronic small vessel ischemia. The basilar cisterns are patent. No evidence of territorial infarct or acute ischemia. No extra-axial or intracranial fluid collection. Vascular: Atherosclerosis of skullbase vasculature without hyperdense vessel or abnormal calcification. Skull: No evidence of skull fracture, mild motion artifact limitations. Sinuses/Orbits: Paranasal sinuses and mastoid air cells are clear. The visualized orbits are unremarkable. Other: None. IMPRESSION: 1. No acute intracranial abnormality. No skull fracture. 2. Age related atrophy and chronic small vessel ischemia. Electronically Signed   By: Andrea Gasman M.D.   On: 04/24/2024 21:22   CT ABDOMEN PELVIS W CONTRAST Result Date: 04/24/2024 EXAM: CT ABDOMEN AND PELVIS WITH CONTRAST 04/24/2024 09:04:37 PM TECHNIQUE: CT of the abdomen and pelvis was performed with the administration of 100 mL of iohexol  (OMNIPAQUE ) 300 MG/ML solution. Multiplanar reformatted images are provided for review. Automated exposure control, iterative reconstruction, and/or weight-based adjustment of the mA/kV was utilized to reduce the radiation dose to as low as reasonably achievable. COMPARISON: None available. CLINICAL HISTORY: No bowel movement in 3 days. Febrile. Evaluate for  ileus/obstruction. Rash in the perineum area. No obvious fluctuance or crepitus appreciated. FINDINGS: LOWER CHEST: No acute abnormality. LIVER: Pneumobilia. GALLBLADDER AND BILE DUCTS: Cholecystectomy. No biliary ductal dilatation. SPLEEN: No acute abnormality. PANCREAS: No acute abnormality. ADRENAL GLANDS: No acute abnormality. KIDNEYS, URETERS AND BLADDER: No stones in the kidneys or ureters. No hydronephrosis. No perinephric or periureteral stranding. Urinary bladder is unremarkable. GI AND BOWEL: Stomach demonstrates no acute abnormality. Large stool burden in the sigmoid colon and rectum. Right inguinal hernia containing a knuckle of nonobstructed small bowel. There is no bowel obstruction. PERITONEUM AND RETROPERITONEUM: No ascites. No free air. VASCULATURE: Aorta is normal in caliber. Aortic atherosclerotic calcification. LYMPH NODES: 1.2 cm right inguinal lymph node is likely reactive. Correlate for cellulitis. REPRODUCTIVE ORGANS: No acute abnormality. BONES AND SOFT TISSUES: No acute osseous  abnormality. Subcutaneous fat stranding in the mons pubis extending into the right perineum. IMPRESSION: 1. Subcutaneous fat stranding in the mons pubis extending into the right perineum, c correlate for cellulitis. 2. Large stool burden in the sigmoid colon and rectum. Electronically signed by: Norman Gatlin MD 04/24/2024 09:21 PM EST RP Workstation: [id number]   CT Cervical Spine Wo Contrast Result Date: 04/24/2024 CLINICAL DATA:  Blunt trauma.  Altered mental status. EXAM: CT CERVICAL SPINE WITHOUT CONTRAST TECHNIQUE: Multidetector CT imaging of the cervical spine was performed without intravenous contrast. Multiplanar CT image reconstructions were also generated. RADIATION DOSE REDUCTION: This exam was performed according to the departmental dose-optimization program which includes automated exposure control, adjustment of the mA and/or kV according to patient size and/or use of iterative reconstruction  technique. COMPARISON:  None Available. FINDINGS: Alignment: Straightening of normal lordosis. Trace anterolisthesis of C7 on T1, likely degenerative. Mild broad-based levo scoliotic curvature. No traumatic subluxation. Skull base and vertebrae: No acute fracture. Vertebral body heights are maintained. The dens and skull base are intact. Soft tissues and spinal canal: No prevertebral fluid or swelling. No visible canal hematoma. Disc levels: Disc space narrowing and spurring with posterior disc osteophyte complex at C5-C6 causes mild narrowing of the spinal canal. Mild multilevel facet hypertrophy. Upper chest: No acute findings. Other: Carotid calcifications. IMPRESSION: Degenerative change in the cervical spine without acute fracture or subluxation. Electronically Signed   By: Andrea Gasman M.D.   On: 04/24/2024 21:20   DG Chest Portable 1 View Result Date: 04/24/2024 EXAM: 1 VIEW(S) XRAY OF THE CHEST 04/24/2024 08:20:00 PM COMPARISON: Portable chest 02/13/2021. CLINICAL HISTORY: Fever. Altered mental status. FINDINGS: LUNGS AND PLEURA: Mild symmetric chronic changes of the lungs. No focal pneumonia is evident. No pleural effusion. No pneumothorax. HEART AND MEDIASTINUM: Similar stable mild cardiomegaly without evidence of congestive heart failure although with mild central vascular prominence. Stable mediastinum with tortuous aorta with patchy calcification. BONES AND SOFT TISSUES: Osteopenia and degenerative skeletal changes. IMPRESSION: 1. No acute cardiopulmonary findings. 2. stable cardiomegaly and mild central vascular prominence. Electronically signed by: Francis Quam MD 04/24/2024 08:33 PM EST RP Workstation: [id number]    EKG: Pending at this time.  Assessment and Plan  Cellulitis of mons pubis/right perineum Presenting with fever but does not meet any other SIRS criteria.  Lactate normal.  No signs of sepsis at this time.  Unable to personally examine the patient at this time due to her  altered mental status/agitation but based on ED physician documentation, no obvious signs of Fournier's gangrene.  Patient does not have history of diabetes.  Continue Zosyn , trend WBC count, monitor vitals, and follow-up blood cultures.  Constipation Continue aggressive bowel regimen.  Acute metabolic encephalopathy/confusion/agitation Likely due to infection and constipation.  Continue management as above.  UA showing sterile pyuria, will add on urine culture to make sure she does not have a UTI.  CT head showing no acute intracranial abnormality and no obvious focal neurodeficit on exam.  She is requiring waist belt and mittens.  Agitated and trying to get out of bed, pull out IV.  Nursing staff requesting medication.  PRN Haldol  ordered for agitation.  Addendum 04/25/2024 at 5:16 AM: Will also check TSH, B12, and ammonia.  Mild hyponatremia IV fluid hydration with normal saline and monitor sodium level.  Generalized weakness/fall at home PT/OT eval, fall precautions  Dementia Mood disorder Continue mirtazapine .  Delirium precautions.  Overactive bladder Continue Myrbetriq .  DVT prophylaxis: Lovenox  Code Status: DNR/DNI (discussed with the  patient's daughter) Family Communication: Daughter and son-in-law at bedside. Level of care: Med-Surg Admission status: It is my clinical opinion that admission to INPATIENT is reasonable and necessary because of the expectation that this patient will require hospital care that crosses at least 2 midnights to treat this condition based on the medical complexity of the problems presented.  Given the aforementioned information, the predictability of an adverse outcome is felt to be significant.  Editha Ram MD [facility]  If 7PM-7AM, please contact night-coverage [email or web address]  04/25/2024, 3:45 AM       [1]  Allergies Allergen Reactions   Shellfish Allergy Swelling and Rash   "

## 2024-04-25 NOTE — Consult Note (Signed)
 "       Date of Admission:  04/24/2024          Reason for Consult: Herpetiform rash on right buttocks, thigh and perineum anteriorly    Referring Provider: True Atlas, MD   Assessment:  Herpetiform rash involving buttocks and perineum  due to VZV vs HSV Dementia B12 deficiency Overactive bladder Delirium  Plan:  Continue acyclovir  DC Zosyn  I have swabbed one of the vesicles on her buttocks which has been sent for VZV PCR and HSV PCR Airborne contact precautions Will consider MRI of the brain with and without contrast or CT of the brain with contrast if the patient improves and we can do informative imaging.     HPI: Ebony Pierce is an 89 year old T[place] speaking woman who has a history of dementia and hearing loss overactive bladder gastroesophageal reflux disease B12 deficiency who had developed a rash on her buttocks and right vulva over the last 3 to 4 days with back pain and worsening mentation.  In the ER Ebony Pierce had low-grade temperature of 100.6 degrees.  Ebony Pierce had workup with negative UA testing for COVID flu RSV.  Blood cultures were taken chest x-ray was done which did not show any pathology CT abdomen pelvis showed subsequent Hannahs fat stranding in the mons pubis extending the right perineum.  CT of the head and C-spine were negative.  Patient was initially placed on Zosyn  for possible bacterial infection but now has had acyclovir  added now that the rash has been carefully examined and has been found to be a herpetiform rash involving the right buttocks right thigh and the right perineum.  There is concern about whether Ebony Pierce has CNS involvement.  Ebony Pierce definitely has a vesicular rash that is herpetic in nature.  It is difficult to discern whether this is truly varicella-zoster involving sacral dermatomes or whether this could be reactivation of herpes simplex type II.  Acyclovir  will work for either pathogen.  I have unroofed the vesicle and swabbed it and  submitted in a viral transport media to the lab so that VZV PCR and HSV 1 and 2 PCR's can be performed.  Consideration was made to performing a lumbar puncture to look for CNS infection.  I think at this point in time I would forego the lumbar puncture and continue with acyclovir .  The patient should be maintained in airborne contact precautions until we have an answer to whether this is VZV or HSV.  Hopefully Ebony Pierce improves cognitively and then we can consider getting imaging of her brain such as an MRI with and without contrast or CT of the head with contrast.  Typically VZV there are is strokelike pathology seen on imaging HSV 2 does not necessarily show much abnormalities on imaging other than potentially leptomeningeal enhancement or HSV 1 would more likely show temporal lobe enhancement.  The latter is not suspected here.  Would dc zosyn  if not done already     I personally spent a total of 80 minutes in the care of the patient today including preparing to see the patient, getting/reviewing separately obtained history, performing a medically appropriate exam/evaluation, counseling and educating, placing orders, referring and communicating with other health care professionals, documenting clinical information in the EHR, independently interpreting results, communicating results, and obtaining swabs for HSV, VZV PCR and delivering them to lab.   Evaluation of the patient requires complex antimicrobial therapy evaluation, counseling , isolation needs to reduce disease transmission and risk assessment and mitigation.  Review of Systems: Review of Systems  Unable to perform ROS: Dementia    Past Medical History:  Diagnosis Date   Age-related hearing loss 10/19/2016   Cataract    s/p R cataract surgery   Dementia with behavioral disturbance (HCC) 07/02/2015   MoCA 10/30 (Administered with assistance of Thai interpreter in exam room (11/12/16)   Reduced vision 09/16/2016   Weight loss,  non-intentional 04/14/2018    Social History[1]  Family History  Problem Relation Age of Onset   Hypertension Mother    Allergies[2]  OBJECTIVE: Blood pressure 118/82, pulse 84, temperature 98 F (36.7 C), resp. rate 16, height 5' (1.524 m), weight 56.7 kg, SpO2 100%.  Physical Exam Exam conducted with a chaperone present.  Eyes:     General:        Right eye: No discharge.        Left eye: No discharge.  Cardiovascular:     Heart sounds: No murmur heard.    No friction rub. No gallop.  Pulmonary:     Effort: No respiratory distress.     Breath sounds: No wheezing.  Abdominal:     General: There is no distension.  Musculoskeletal:        General: Normal range of motion.  Neurological:     General: No focal deficit present.     Mental Status: Ebony Pierce is alert. Ebony Pierce is disoriented.  Psychiatric:        Attention and Perception: Ebony Pierce is inattentive.        Behavior: Behavior is agitated.        Cognition and Memory: Cognition is impaired. Memory is impaired. Ebony Pierce exhibits impaired remote memory.    Rash 04/24/2024:        04/25/24     Perineum    Lab Results Lab Results  Component Value Date   WBC 6.2 04/25/2024   HGB 13.4 04/25/2024   HCT 41.1 04/25/2024   MCV 89.2 04/25/2024   PLT 200 04/25/2024    Lab Results  Component Value Date   CREATININE 0.76 04/25/2024   BUN 15 04/25/2024   NA 134 (L) 04/25/2024   K 4.0 04/25/2024   CL 101 04/25/2024   CO2 23 04/25/2024    Lab Results  Component Value Date   ALT 19 04/24/2024   AST 34 04/24/2024   ALKPHOS 76 04/24/2024   BILITOT 0.4 04/24/2024     Microbiology: Recent Results (from the past 240 hours)  Resp panel by RT-PCR (RSV, Flu A&B, Covid) Anterior Nasal Swab     Status: None   Collection Time: 04/24/24  6:45 PM   Specimen: Anterior Nasal Swab  Result Value Ref Range Status   SARS Coronavirus 2 by RT PCR NEGATIVE NEGATIVE Final    Comment: (NOTE) SARS-CoV-2 target nucleic acids are NOT  DETECTED.  The SARS-CoV-2 RNA is generally detectable in upper respiratory specimens during the acute phase of infection. The lowest concentration of SARS-CoV-2 viral copies this assay can detect is 138 copies/mL. A negative result does not preclude SARS-Cov-2 infection and should not be used as the sole basis for treatment or other patient management decisions. A negative result may occur with  improper specimen collection/handling, submission of specimen other than nasopharyngeal swab, presence of viral mutation(s) within the areas targeted by this assay, and inadequate number of viral copies(<138 copies/mL). A negative result must be combined with clinical observations, patient history, and epidemiological information. The expected result is Negative.  Fact Sheet for Patients:  bloggercourse.com  Fact Sheet for Healthcare Providers:  seriousbroker.it  This test is no t yet approved or cleared by the [place]  FDA and  has been authorized for detection and/or diagnosis of SARS-CoV-2 by FDA under an Emergency Use Authorization (EUA). This EUA will remain  in effect (meaning this test can be used) for the duration of the COVID-19 declaration under Section 564(b)(1) of the Act, 21 U.S.C.section 360bbb-3(b)(1), unless the authorization is terminated  or revoked sooner.       Influenza A by PCR NEGATIVE NEGATIVE Final   Influenza B by PCR NEGATIVE NEGATIVE Final    Comment: (NOTE) The Xpert Xpress SARS-CoV-2/FLU/RSV plus assay is intended as an aid in the diagnosis of influenza from Nasopharyngeal swab specimens and should not be used as a sole basis for treatment. Nasal washings and aspirates are unacceptable for Xpert Xpress SARS-CoV-2/FLU/RSV testing.  Fact Sheet for Patients: bloggercourse.com  Fact Sheet for Healthcare Providers: seriousbroker.it  This test is not yet  approved or cleared by the [place]  FDA and has been authorized for detection and/or diagnosis of SARS-CoV-2 by FDA under an Emergency Use Authorization (EUA). This EUA will remain in effect (meaning this test can be used) for the duration of the COVID-19 declaration under Section 564(b)(1) of the Act, 21 U.S.C. section 360bbb-3(b)(1), unless the authorization is terminated or revoked.     Resp Syncytial Virus by PCR NEGATIVE NEGATIVE Final    Comment: (NOTE) Fact Sheet for Patients: bloggercourse.com  Fact Sheet for Healthcare Providers: seriousbroker.it  This test is not yet approved or cleared by the [place]  FDA and has been authorized for detection and/or diagnosis of SARS-CoV-2 by FDA under an Emergency Use Authorization (EUA). This EUA will remain in effect (meaning this test can be used) for the duration of the COVID-19 declaration under Section 564(b)(1) of the Act, 21 U.S.C. section 360bbb-3(b)(1), unless the authorization is terminated or revoked.  Performed at [facility], [address], [place], [place] [address]   Blood culture (routine x 2)     Status: None (Preliminary result)   Collection Time: 04/24/24  8:02 PM   Specimen: BLOOD LEFT HAND  Result Value Ref Range Status   Specimen Description   Final    BLOOD LEFT HAND Performed at [facility], [address], [place], [place] [address]    Special Requests   Final    BOTTLES DRAWN AEROBIC AND ANAEROBIC Blood Culture adequate volume Performed at [facility], [address], [place], [place] [address]    Culture   Final    NO GROWTH < 12 HOURS Performed at [facility], [address], [place], [place] [address]    Report Status PENDING  Incomplete    Jomarie Fleeta Rothman, MD [facility] [phone] pager  04/25/2024, 2:36 PM      [1]   Social History Tobacco Use   Smoking status: Never   Smokeless tobacco: Never  Vaping Use   Vaping status: Never Used  Substance Use Topics   Alcohol use: No    Alcohol/week: 0.0 standard drinks of alcohol   Drug use: No  [2]  Allergies Allergen Reactions   Shellfish Allergy Swelling and Rash   "

## 2024-04-25 NOTE — Progress Notes (Signed)
 Patient's daughter returned call and RN encouraged daughter to come back to the hospital because patient was agitated/physically aggressive, refusing care and attempting to get out of bed unsafely. Daughter, Brant stated that she would come back to the hospital to assist with providing communication for patient care. Daughter let patient know that she would be back to the hospital in the next 20-30 minutes. RN will continue to monitor patient. Bed alarm set. RN initiated falls protocol. VSS and Hospitalist has been assigned.

## 2024-04-25 NOTE — Progress Notes (Signed)
 OT Cancellation Note  Patient Details Name: Ebony Pierce MRN: [id number] DOB: [birth year]   Cancelled Treatment:    Reason Eval/Treat Not Completed: Medical issues which prohibited therapy. Orders received, chart reviewed. Pt with agitation and currently in restraints, received Haldol  earlier this AM. OT will re-attempt when pt appropriate as able.    Sherae Santino L. Jeneva Schweizer, OTR/L  04/25/24, 10:34 AM

## 2024-04-26 DIAGNOSIS — G9340 Encephalopathy, unspecified: Secondary | ICD-10-CM | POA: Diagnosis not present

## 2024-04-26 DIAGNOSIS — F03B0 Unspecified dementia, moderate, without behavioral disturbance, psychotic disturbance, mood disturbance, and anxiety: Secondary | ICD-10-CM | POA: Diagnosis not present

## 2024-04-26 DIAGNOSIS — R410 Disorientation, unspecified: Secondary | ICD-10-CM | POA: Diagnosis not present

## 2024-04-26 DIAGNOSIS — L130 Dermatitis herpetiformis: Secondary | ICD-10-CM | POA: Diagnosis not present

## 2024-04-26 NOTE — TOC Initial Note (Addendum)
 Transition of Care [facility]) - Initial/Assessment Note    Patient Details  Name: Ebony Pierce MRN: [id number] Date of Birth: 11-21-1935  Transition of [facility]) CM/SW Contact:    Alfonse JONELLE Rex, RN Phone Number: 04/26/2024, 11:27 AM  Clinical Narrative:      Call to patient's daughter, Brant Barefoot, to introduce role of INPT  CM and review for dc planning. Bo states patient resides with her and she is her primary care giver, no current home care services, states prior to hospital admission patient was ambulatory with a RW. Plan to dc home with family. PT eval pending, await recommendation. CM following for dc needs.       -2:00pm PT eval completed, recommendation for short term rehab/SNF. Call to pt's dtr, BO, agreeable, no preference. FL2 updated, Level 2 PASRR pending, faxed out for bed offers.          Expected Discharge Plan: Home w Home Health Services     Patient Goals and CMS Choice Patient states their goals for this hospitalization and ongoing recovery are:: return home CMS Medicare.gov Compare Post Acute Care list provided to:: Patient Represenative (must comment) (Mills,Bo  Daughter, Emergency Contact  [phone] (Mobile)) Choice offered to / list presented to : Adult Children [place] ownership interest in [facility]provided to:: Adult Children    Expected Discharge Plan and Services       Living arrangements for the past 2 months: Single Family Home                                      Prior Living Arrangements/Services Living arrangements for the past 2 months: Single Family Home Lives with:: Adult Children Patient language and need for interpreter reviewed:: Yes        Need for Family Participation in Patient Care: Yes (Comment) Care giver support system in place?: Yes (comment) Current home services: DME (walker) Criminal Activity/Legal Involvement Pertinent to Current Situation/Hospitalization: No - Comment as needed  Activities of  Daily Living   ADL Screening (condition at time of admission) Independently performs ADLs?: No Does the patient have a NEW difficulty with bathing/dressing/toileting/self-feeding that is expected to last >3 days?: Yes (Initiates electronic notice to provider for possible OT consult) Does the patient have a NEW difficulty with getting in/out of bed, walking, or climbing stairs that is expected to last >3 days?: Yes (Initiates electronic notice to provider for possible PT consult) Does the patient have a NEW difficulty with communication that is expected to last >3 days?: Yes (Initiates electronic notice to provider for possible SLP consult) (language barrier - Thai) Is the patient deaf or have difficulty hearing?: Yes Does the patient have difficulty seeing, even when wearing glasses/contacts?: Yes Does the patient have difficulty concentrating, remembering, or making decisions?: Yes  Permission Sought/Granted                  Emotional Assessment         Alcohol / Substance Use: Not Applicable Psych Involvement: No (comment)  Admission diagnosis:  Disorientation [R41.0] Chronic idiopathic constipation [K59.04] Acute encephalopathy [G93.40] Fever, unspecified fever cause [R50.9] Patient Active Problem List   Diagnosis Date Noted   Cellulitis 04/25/2024   Constipation 04/25/2024   Hyponatremia 04/25/2024   Herpetiform eruption 04/25/2024   Disorientation 04/25/2024   Delirium 04/25/2024   Acute encephalopathy 04/24/2024   Urge incontinence of urine 10/27/2023   Orthostatic  hypotension 06/11/2022   B12 deficiency 08/28/2021   Biliary obstruction (HCC)    Cholecystitis, acute    RUQ pain 02/16/2021   Elevated liver enzymes 02/16/2021   Abnormal gallbladder ultrasound 02/16/2021   Leukocytosis 02/16/2021   B12 deficiency anemia 10/13/2019   Macrocytic anemia 09/13/2019   Bilateral hearing loss 09/13/2019   Fatigue 09/13/2019   Sundowning 01/05/2019   Caregiver role  strain 04/14/2018   Age-related hearing loss 10/19/2016   Vision decreased 09/16/2016   Dementia (HCC) 07/02/2015   PCP:  Manon Jester, DO Pharmacy:   [facility] [phone] - [place], [place] - [address] [place] [address] Phone: [phone] Fax: [phone]  [place] [facility] [address] [place] [address] Phone: [phone] Fax: [phone]     Social Drivers of Health (SDOH) Social History: SDOH Screenings   Food Insecurity: No Food Insecurity (04/25/2024)  Housing: Low Risk (04/25/2024)  Transportation Needs: No Transportation Needs (04/25/2024)  Utilities: Not At Risk (04/25/2024)  Alcohol Screen: Low Risk (06/28/2023)  Depression (PHQ2-9): Medium Risk (11/26/2023)  Financial Resource Strain: Low Risk (06/28/2023)  Physical Activity: Inactive (06/28/2023)  Social Connections: Socially Isolated (04/25/2024)  Stress: No Stress Concern Present (06/28/2023)  Tobacco Use: Low Risk (04/24/2024)  Health Literacy: Patient Unable To Answer (06/28/2023)   SDOH Interventions: Food Insecurity Interventions: Intervention Not Indicated   Readmission Risk Interventions    04/26/2024   11:26 AM  Readmission Risk Prevention Plan  Post Dischage Appt Complete  Medication Screening Complete  Transportation Screening Complete

## 2024-04-26 NOTE — Plan of Care (Signed)
   Problem: Coping: Goal: Level of anxiety will decrease Outcome: Progressing   Problem: Pain Managment: Goal: General experience of comfort will improve and/or be controlled Outcome: Progressing   Problem: Safety: Goal: Ability to remain free from injury will improve Outcome: Progressing

## 2024-04-26 NOTE — [place] FL2 (Signed)
 " [place]  MEDICAID FL2 LEVEL OF CARE FORM     IDENTIFICATION  Patient Name: Ebony Pierce Birthdate: 01/03/1936 Sex: female Admission Date (Current Location): 04/24/2024  [facility] and [place] Number:  Producer, Television/film/video and Address:  [facility],  [address] [place], [place] [address]      Provider Number: [id number]  Attending Physician Name and Address:  Jonel Lonni SQUIBB, *  Relative Name and Phone Number:  Mills,Bo  Daughter, Emergency Contact  [phone] (Mobile)    Current Level of Care: Hospital Recommended Level of Care: Skilled Nursing Facility Prior Approval Number:    Date Approved/Denied:   PASRR Number: pending  Discharge Plan: SNF    Current Diagnoses: Patient Active Problem List   Diagnosis Date Noted   Cellulitis 04/25/2024   Constipation 04/25/2024   Hyponatremia 04/25/2024   Herpetiform eruption 04/25/2024   Disorientation 04/25/2024   Delirium 04/25/2024   Acute encephalopathy 04/24/2024   Urge incontinence of urine 10/27/2023   Orthostatic hypotension 06/11/2022   B12 deficiency 08/28/2021   Biliary obstruction (HCC)    Cholecystitis, acute    RUQ pain 02/16/2021   Elevated liver enzymes 02/16/2021   Abnormal gallbladder ultrasound 02/16/2021   Leukocytosis 02/16/2021   B12 deficiency anemia 10/13/2019   Macrocytic anemia 09/13/2019   Bilateral hearing loss 09/13/2019   Fatigue 09/13/2019   Sundowning 01/05/2019   Caregiver role strain 04/14/2018   Age-related hearing loss 10/19/2016   Vision decreased 09/16/2016   Dementia (HCC) 07/02/2015    Orientation RESPIRATION BLADDER Height & Weight     Self  Normal Incontinent, External catheter Weight: 56.7 kg Height:  5' (152.4 cm)  BEHAVIORAL SYMPTOMS/MOOD NEUROLOGICAL BOWEL NUTRITION STATUS      Continent Diet (regular)  AMBULATORY STATUS COMMUNICATION OF NEEDS Skin   Extensive Assist Verbally Other (Comment) (rash on her buttocks/right vulva)                        Personal Care Assistance Level of Assistance  Bathing, Feeding, Dressing Bathing Assistance: Maximum assistance Feeding assistance: Maximum assistance Dressing Assistance: Maximum assistance     Functional Limitations Info  Sight, Hearing, Speech Sight Info: Impaired Hearing Info: Impaired (hard of hearing) Speech Info: Adequate    SPECIAL CARE FACTORS FREQUENCY  PT (By licensed PT), OT (By licensed OT)     PT Frequency: 5x/wk OT Frequency: 5x/wk            Contractures Contractures Info: Not present    Additional Factors Info  Code Status, Allergies, Psychotropic Code Status Info: DNR Allergies Info: Shellfish Allergy Psychotropic Info: N/A         Current Medications (04/26/2024):  This is the current hospital active medication list Current Facility-Administered Medications  Medication Dose Route Frequency Provider Last Rate Last Admin   0.9 %  sodium chloride  infusion   Intravenous Continuous Mark Bard LABOR, RPH   Stopped at 04/26/24 1123   acetaminophen  (TYLENOL ) tablet 650 mg  650 mg Oral Q6H PRN Rathore, Vasundhra, MD   650 mg at 04/26/24 1122   Or   acetaminophen  (TYLENOL ) suppository 650 mg  650 mg Rectal Q6H PRN Alfornia Madison, MD       acyclovir  (ZOVIRAX ) 500 mg in dextrose  5 % 100 mL IVPB  500 mg Intravenous BID Mark Bard LABOR, RPH 110 mL/hr at 04/25/24 2258 500 mg at 04/25/24 2258   bisacodyl  (DULCOLAX) suppository 10 mg  10 mg Rectal Daily PRN Alfornia Madison, MD  cyanocobalamin  (VITAMIN B12) injection 1,000 mcg  1,000 mcg Subcutaneous Q0600 Rizwan, Saima, MD   1,000 mcg at 04/26/24 9370   enoxaparin  (LOVENOX ) injection 40 mg  40 mg Subcutaneous Q24H Rathore, Vasundhra, MD   40 mg at 04/26/24 1123   haloperidol  lactate (HALDOL ) injection 2 mg  2 mg Intravenous Q6H PRN Rathore, Vasundhra, MD   2 mg at 04/25/24 2044   HYDROcodone -acetaminophen  (NORCO/VICODIN) 5-325 MG per tablet 1 tablet  1 tablet Oral Q4H PRN Rizwan, Saima, MD   1  tablet at 04/26/24 1122   Or   morphine  (PF) 2 MG/ML injection 2 mg  2 mg Intravenous Q4H PRN Rizwan, Saima, MD       melatonin tablet 5 mg  5 mg Oral QHS Rizwan, Saima, MD   5 mg at 04/25/24 2048   mirabegron  ER (MYRBETRIQ ) tablet 25 mg  25 mg Oral Daily Rizwan, Saima, MD   25 mg at 04/26/24 1122   mirtazapine  (REMERON ) tablet 7.5 mg  7.5 mg Oral QPM Rizwan, Saima, MD   7.5 mg at 04/25/24 1900   polyethylene glycol (MIRALAX  / GLYCOLAX ) packet 17 g  17 g Oral Daily Lemly, Tatum N, MD   17 g at 04/25/24 1126   senna-docusate (Senokot-S) tablet 2 tablet  2 tablet Oral QHS Rizwan, Saima, MD   2 tablet at 04/25/24 2048     Discharge Medications: Please see discharge summary for a list of discharge medications.  Relevant Imaging Results:  Relevant Lab Results:   Additional Information SSN: [id number]  Alfonse JONELLE Rex, RN     "

## 2024-04-26 NOTE — Evaluation (Signed)
 Occupational Therapy Evaluation Patient Details Name: Ebony Pierce MRN: [id number] DOB: October 31, 1935 Today's Date: 04/26/2024   History of Present Illness   89 yo female admitted with herpetiform eruption, AMS, fall. Hx of dementia, OAB, B12 defiency, GERD, hearing loss, reduced vision     Clinical Impressions Pt admitted with the above concerns. Pt currently with functional limitations due to the deficits listed below (see OT Problem List). Eval limited d/t pt's increased fatigue. Family not present during session. In-person interpreter used to help with communication. Pt was able to sit EOB with physical assist although declined standing with therapist d/t to fatigue. Per chart review, family wishes to take pt home when discharged.  Pt will benefit from acute skilled OT to increase their safety and independence with ADL and functional mobility for ADL to facilitate discharge. Discharge recommendation is pending d/t to limited participation. Will update at next session.       If plan is discharge home, recommend the following:   A lot of help with walking and/or transfers;A lot of help with bathing/dressing/bathroom;Supervision due to cognitive status;Help with stairs or ramp for entrance;Assistance with cooking/housework;Assist for transportation     Functional Status Assessment   Patient has had a recent decline in their functional status and demonstrates the ability to make significant improvements in function in a reasonable and predictable amount of time.     Equipment Recommendations   Other (comment) (TBD)      Precautions/Restrictions   Precautions Precautions: Fall;Other (comment) Recall of Precautions/Restrictions: Impaired Precaution/Restrictions Comments: dementia Restrictions Weight Bearing Restrictions Per Provider Order: No     Mobility Bed Mobility Overal bed mobility: Needs Assistance Bed Mobility: Supine to Sit, Sit to Supine     Supine to sit:  Total assist, +2 for physical assistance, +2 for safety/equipment, HOB elevated Sit to supine: Total assist, +2 for physical assistance, +2 for safety/equipment, HOB elevated   General bed mobility comments: Assist for trunk and bil LEs. Utilized bedpad to aid with getting pt to EOB. Mod A for static sitting balance.    Transfers  General transfer comment: Pt declined despite encouragement from therapist and interpreter      Balance Overall balance assessment: Needs assistance, History of Falls Sitting-balance support: Bilateral upper extremity supported, Feet supported Sitting balance-Leahy Scale: Poor Sitting balance - Comments: sitting EOB. pt able to briefly hold self upright while seated on EOB although d/t fatigue was provided with min A.       ADL either performed or assessed with clinical judgement   ADL       General ADL Comments: At time of eval, pt requires total x1-2 for BADL's d/t language barrier, cognitive deficits, and fatigue.     Vision   Additional Comments: unable to assess vision or obtain baseline vision     Perception Perception: Not tested       Praxis Praxis: Not tested       Pertinent Vitals/Pain Pain Assessment Pain Assessment: PAINAD Breathing: normal Negative Vocalization: none Facial Expression: smiling or inexpressive Body Language: relaxed Consolability: no need to console PAINAD Score: 0     Extremity/Trunk Assessment Upper Extremity Assessment Upper Extremity Assessment: Difficult to assess due to impaired cognition   Lower Extremity Assessment Lower Extremity Assessment: Difficult to assess due to impaired cognition   Cervical / Trunk Assessment Cervical / Trunk Assessment: Normal   Communication Communication Communication: Impaired Factors Affecting Communication: Hearing impaired   Cognition Arousal: Lethargic Behavior During Therapy: Flat affect Cognition: History of cognitive  impairments             OT -  Cognition Comments: history of dementia      Following commands: Impaired Following commands impaired: Follows one step commands inconsistently     Cueing  General Comments   Cueing Techniques: Visual cues              Home Living Family/patient expects to be discharged to:: Private residence Living Arrangements: Children (daughter, Ebony Pierce) Available Help at Discharge: Family    Home Equipment: Agricultural Consultant (2 wheels)          Prior Functioning/Environment Prior Level of Function : Patient poor historian/Family not available      Mobility Comments: uses RW (per [employer] review)      OT Problem List: Decreased strength;Decreased activity tolerance;Impaired balance (sitting and/or standing);Decreased knowledge of use of DME or AE;Impaired UE functional use   OT Treatment/Interventions: Self-care/ADL training;Therapeutic exercise;Therapeutic activities;DME and/or AE instruction;Patient/family education;Balance training      OT Goals(Current goals can be found in the care plan section)   Acute Rehab OT Goals Patient Stated Goal: to sleep OT Goal Formulation: Patient unable to participate in goal setting Time For Goal Achievement: 05/10/24 Potential to Achieve Goals: Fair   OT Frequency:  Min 2X/week    Co-evaluation PT/OT/SLP Co-Evaluation/Treatment: Yes Reason for Co-Treatment: To address functional/ADL transfers;Necessary to address cognition/behavior during functional activity   OT goals addressed during session: ADL's and self-care;Strengthening/ROM;Proper use of Adaptive equipment and DME      AM-PAC OT 6 Clicks Daily Activity     Outcome Measure Help from another person eating meals?: Total Help from another person taking care of personal grooming?: Total Help from another person toileting, which includes using toliet, bedpan, or urinal?: Total Help from another person bathing (including washing, rinsing, drying)?: Total Help from another person to put  on and taking off regular upper body clothing?: Total Help from another person to put on and taking off regular lower body clothing?: Total 6 Click Score: 6   End of Session    Activity Tolerance: Patient limited by fatigue;Patient limited by lethargy Patient left: in bed;with call bell/phone within reach;with nursing/sitter in room  OT Visit Diagnosis: Muscle weakness (generalized) (M62.81);History of falling (Z91.81)                Time: 8778-8763 OT Time Calculation (min): 15 min Charges:  OT General Charges $OT Visit: 1 Visit OT Evaluation $OT Eval Low Complexity: 1 Low  Ebony Pierce, OTR/L,CBIS  Supplemental OT - [facility] and [facility] Secure Chat Preferred    Ebony Pierce, Ebony BIRCH 04/26/2024, 1:51 PM

## 2024-04-26 NOTE — Progress Notes (Signed)
 "       Subjective:  Patient is delirious   Antibiotics:  Anti-infectives (From admission, onward)    Start     Dose/Rate Route Frequency Ordered Stop   04/25/24 1200  acyclovir  (ZOVIRAX ) 500 mg in dextrose  5 % 100 mL IVPB        500 mg 110 mL/hr over 60 Minutes Intravenous 2 times daily 04/25/24 1042     04/25/24 0545  piperacillin -tazobactam (ZOSYN ) IVPB 3.375 g  Status:  Discontinued        3.375 g 12.5 mL/hr over 240 Minutes Intravenous Every 8 hours 04/25/24 0452 04/25/24 1449   04/24/24 1945  piperacillin -tazobactam (ZOSYN ) IVPB 3.375 g        3.375 g 100 mL/hr over 30 Minutes Intravenous  Once 04/24/24 1936 04/24/24 2030       Medications: Scheduled Meds:  cyanocobalamin   1,000 mcg Subcutaneous Q0600   enoxaparin  (LOVENOX ) injection  40 mg Subcutaneous Q24H   melatonin  5 mg Oral QHS   mirabegron  ER  25 mg Oral Daily   mirtazapine   7.5 mg Oral QPM   polyethylene glycol  17 g Oral Daily   senna-docusate  2 tablet Oral QHS   Continuous Infusions:  sodium chloride  Stopped (04/26/24 1123)   acyclovir  500 mg (04/26/24 1530)   PRN Meds:.acetaminophen  **OR** acetaminophen , bisacodyl , haloperidol  lactate, HYDROcodone -acetaminophen  **OR** morphine  injection    Objective: Weight change:   Intake/Output Summary (Last 24 hours) at 04/26/2024 1617 Last data filed at 04/26/2024 0602 Gross per 24 hour  Intake 2408.8 ml  Output 300 ml  Net 2108.8 ml   Blood pressure 102/68, pulse 73, temperature 98.2 F (36.8 C), temperature source Oral, resp. rate 14, height 5' (1.524 m), weight 56.7 kg, SpO2 96%. Temp:  [97.9 F (36.6 C)-99.2 F (37.3 C)] 98.2 F (36.8 C) (02/04 1540) Pulse Rate:  [67-84] 73 (02/04 1540) Resp:  [14-18] 14 (02/04 1540) BP: (102-172)/(67-85) 102/68 (02/04 1540) SpO2:  [95 %-99 %] 96 % (02/04 1540)  Physical Exam: Physical Exam Cardiovascular:     Rate and Rhythm: Tachycardia present.  Pulmonary:     Effort: Pulmonary effort is normal. No  respiratory distress.     Breath sounds: No wheezing.  Abdominal:     General: There is no distension.  Neurological:     General: No focal deficit present.     Mental Status: She is alert. She is disoriented.  Psychiatric:        Cognition and Memory: Cognition is impaired. Memory is impaired. She exhibits impaired recent memory.        Judgment: Judgment is impulsive.      Buttocks today 04/26/2024: more scabbing of lesions       CBC:    BMET Recent Labs    04/24/24 1846 04/25/24 1040  NA 133* 134*  K 3.8 4.0  CL 97* 101  CO2 25 23  GLUCOSE 100* 107*  BUN 18 15  CREATININE 0.73 0.76  CALCIUM 9.8 9.3     Liver Panel  Recent Labs    04/24/24 1846  PROT 7.3  ALBUMIN  4.1  AST 34  ALT 19  ALKPHOS 76  BILITOT 0.4       Sedimentation Rate No results for input(s): ESRSEDRATE in the last 72 hours. C-Reactive Protein No results for input(s): CRP in the last 72 hours.  Micro Results: Recent Results (from the past 720 hours)  Resp panel by RT-PCR (RSV, Flu A&B, Covid) Anterior Nasal Swab  Status: None   Collection Time: 04/24/24  6:45 PM   Specimen: Anterior Nasal Swab  Result Value Ref Range Status   SARS Coronavirus 2 by RT PCR NEGATIVE NEGATIVE Final    Comment: (NOTE) SARS-CoV-2 target nucleic acids are NOT DETECTED.  The SARS-CoV-2 RNA is generally detectable in upper respiratory specimens during the acute phase of infection. The lowest concentration of SARS-CoV-2 viral copies this assay can detect is 138 copies/mL. A negative result does not preclude SARS-Cov-2 infection and should not be used as the sole basis for treatment or other patient management decisions. A negative result may occur with  improper specimen collection/handling, submission of specimen other than nasopharyngeal swab, presence of viral mutation(s) within the areas targeted by this assay, and inadequate number of viral copies(<138 copies/mL). A negative result must  be combined with clinical observations, patient history, and epidemiological information. The expected result is Negative.  Fact Sheet for Patients:  bloggercourse.com  Fact Sheet for Healthcare Providers:  seriousbroker.it  This test is no t yet approved or cleared by the [place]  FDA and  has been authorized for detection and/or diagnosis of SARS-CoV-2 by FDA under an Emergency Use Authorization (EUA). This EUA will remain  in effect (meaning this test can be used) for the duration of the COVID-19 declaration under Section 564(b)(1) of the Act, 21 U.S.C.section 360bbb-3(b)(1), unless the authorization is terminated  or revoked sooner.       Influenza A by PCR NEGATIVE NEGATIVE Final   Influenza B by PCR NEGATIVE NEGATIVE Final    Comment: (NOTE) The Xpert Xpress SARS-CoV-2/FLU/RSV plus assay is intended as an aid in the diagnosis of influenza from Nasopharyngeal swab specimens and should not be used as a sole basis for treatment. Nasal washings and aspirates are unacceptable for Xpert Xpress SARS-CoV-2/FLU/RSV testing.  Fact Sheet for Patients: bloggercourse.com  Fact Sheet for Healthcare Providers: seriousbroker.it  This test is not yet approved or cleared by the [place]  FDA and has been authorized for detection and/or diagnosis of SARS-CoV-2 by FDA under an Emergency Use Authorization (EUA). This EUA will remain in effect (meaning this test can be used) for the duration of the COVID-19 declaration under Section 564(b)(1) of the Act, 21 U.S.C. section 360bbb-3(b)(1), unless the authorization is terminated or revoked.     Resp Syncytial Virus by PCR NEGATIVE NEGATIVE Final    Comment: (NOTE) Fact Sheet for Patients: bloggercourse.com  Fact Sheet for Healthcare Providers: seriousbroker.it  This test is not  yet approved or cleared by the [place]  FDA and has been authorized for detection and/or diagnosis of SARS-CoV-2 by FDA under an Emergency Use Authorization (EUA). This EUA will remain in effect (meaning this test can be used) for the duration of the COVID-19 declaration under Section 564(b)(1) of the Act, 21 U.S.C. section 360bbb-3(b)(1), unless the authorization is terminated or revoked.  Performed at [facility], [address] [place], [place] [address]   Blood culture (routine x 2)     Status: None (Preliminary result)   Collection Time: 04/24/24  8:02 PM   Specimen: BLOOD LEFT HAND  Result Value Ref Range Status   Specimen Description   Final    BLOOD LEFT HAND Performed at [facility], [address] [place], [place] [address]    Special Requests   Final    BOTTLES DRAWN AEROBIC AND ANAEROBIC Blood Culture adequate volume Performed at [facility], [address] [place], [place] [address]    Culture  Final    NO GROWTH 2 DAYS Performed at [facility], [address], [place], [place] [address]    Report Status PENDING  Incomplete  Culture, blood (Routine X 2) w Reflex to ID Panel     Status: None (Preliminary result)   Collection Time: 04/25/24 10:41 AM   Specimen: BLOOD  Result Value Ref Range Status   Specimen Description   Final    BLOOD BLOOD RIGHT HAND AEROBIC BOTTLE ONLY Performed at [facility], [address] [place], [place] [address]    Special Requests   Final    BOTTLES DRAWN AEROBIC ONLY Blood Culture results may not be optimal due to an inadequate volume of blood received in culture bottles Performed at [facility], [address], [place], [place] [address]    Culture   Final    NO GROWTH < 24 HOURS Performed at [facility], [address] [place], [place] [address]    Report Status PENDING  Incomplete    Studies/Results: CT Head Wo  Contrast Result Date: 04/24/2024 CLINICAL DATA:  Fall. EXAM: CT HEAD WITHOUT CONTRAST TECHNIQUE: Contiguous axial images were obtained from the base of the skull through the vertex without intravenous contrast. RADIATION DOSE REDUCTION: This exam was performed according to the departmental dose-optimization program which includes automated exposure control, adjustment of the mA and/or kV according to patient size and/or use of iterative reconstruction technique. COMPARISON:  None Available. FINDINGS: Brain: No intracranial hemorrhage, mass effect, or midline shift. Age related atrophy. No hydrocephalus. Mild periventricular and deep white matter hypodensity typical of chronic small vessel ischemia. The basilar cisterns are patent. No evidence of territorial infarct or acute ischemia. No extra-axial or intracranial fluid collection. Vascular: Atherosclerosis of skullbase vasculature without hyperdense vessel or abnormal calcification. Skull: No evidence of skull fracture, mild motion artifact limitations. Sinuses/Orbits: Paranasal sinuses and mastoid air cells are clear. The visualized orbits are unremarkable. Other: None. IMPRESSION: 1. No acute intracranial abnormality. No skull fracture. 2. Age related atrophy and chronic small vessel ischemia. Electronically Signed   By: Andrea Gasman M.D.   On: 04/24/2024 21:22   CT ABDOMEN PELVIS W CONTRAST Result Date: 04/24/2024 EXAM: CT ABDOMEN AND PELVIS WITH CONTRAST 04/24/2024 09:04:37 PM TECHNIQUE: CT of the abdomen and pelvis was performed with the administration of 100 mL of iohexol  (OMNIPAQUE ) 300 MG/ML solution. Multiplanar reformatted images are provided for review. Automated exposure control, iterative reconstruction, and/or weight-based adjustment of the mA/kV was utilized to reduce the radiation dose to as low as reasonably achievable. COMPARISON: None available. CLINICAL HISTORY: No bowel movement in 3 days. Febrile. Evaluate for ileus/obstruction. Rash in  the perineum area. No obvious fluctuance or crepitus appreciated. FINDINGS: LOWER CHEST: No acute abnormality. LIVER: Pneumobilia. GALLBLADDER AND BILE DUCTS: Cholecystectomy. No biliary ductal dilatation. SPLEEN: No acute abnormality. PANCREAS: No acute abnormality. ADRENAL GLANDS: No acute abnormality. KIDNEYS, URETERS AND BLADDER: No stones in the kidneys or ureters. No hydronephrosis. No perinephric or periureteral stranding. Urinary bladder is unremarkable. GI AND BOWEL: Stomach demonstrates no acute abnormality. Large stool burden in the sigmoid colon and rectum. Right inguinal hernia containing a knuckle of nonobstructed small bowel. There is no bowel obstruction. PERITONEUM AND RETROPERITONEUM: No ascites. No free air. VASCULATURE: Aorta is normal in caliber. Aortic atherosclerotic calcification. LYMPH NODES: 1.2 cm right inguinal lymph node is likely reactive. Correlate for cellulitis. REPRODUCTIVE ORGANS: No acute abnormality. BONES AND SOFT TISSUES: No acute osseous abnormality. Subcutaneous fat stranding  in the mons pubis extending into the right perineum. IMPRESSION: 1. Subcutaneous fat stranding in the mons pubis extending into the right perineum, c correlate for cellulitis. 2. Large stool burden in the sigmoid colon and rectum. Electronically signed by: Norman Gatlin MD 04/24/2024 09:21 PM EST RP Workstation: [id number]   CT Cervical Spine Wo Contrast Result Date: 04/24/2024 CLINICAL DATA:  Blunt trauma.  Altered mental status. EXAM: CT CERVICAL SPINE WITHOUT CONTRAST TECHNIQUE: Multidetector CT imaging of the cervical spine was performed without intravenous contrast. Multiplanar CT image reconstructions were also generated. RADIATION DOSE REDUCTION: This exam was performed according to the departmental dose-optimization program which includes automated exposure control, adjustment of the mA and/or kV according to patient size and/or use of iterative reconstruction technique. COMPARISON:  None  Available. FINDINGS: Alignment: Straightening of normal lordosis. Trace anterolisthesis of C7 on T1, likely degenerative. Mild broad-based levo scoliotic curvature. No traumatic subluxation. Skull base and vertebrae: No acute fracture. Vertebral body heights are maintained. The dens and skull base are intact. Soft tissues and spinal canal: No prevertebral fluid or swelling. No visible canal hematoma. Disc levels: Disc space narrowing and spurring with posterior disc osteophyte complex at C5-C6 causes mild narrowing of the spinal canal. Mild multilevel facet hypertrophy. Upper chest: No acute findings. Other: Carotid calcifications. IMPRESSION: Degenerative change in the cervical spine without acute fracture or subluxation. Electronically Signed   By: Andrea Gasman M.D.   On: 04/24/2024 21:20   DG Chest Portable 1 View Result Date: 04/24/2024 EXAM: 1 VIEW(S) XRAY OF THE CHEST 04/24/2024 08:20:00 PM COMPARISON: Portable chest 02/13/2021. CLINICAL HISTORY: Fever. Altered mental status. FINDINGS: LUNGS AND PLEURA: Mild symmetric chronic changes of the lungs. No focal pneumonia is evident. No pleural effusion. No pneumothorax. HEART AND MEDIASTINUM: Similar stable mild cardiomegaly without evidence of congestive heart failure although with mild central vascular prominence. Stable mediastinum with tortuous aorta with patchy calcification. BONES AND SOFT TISSUES: Osteopenia and degenerative skeletal changes. IMPRESSION: 1. No acute cardiopulmonary findings. 2. stable cardiomegaly and mild central vascular prominence. Electronically signed by: Francis Quam MD 04/24/2024 08:33 PM EST RP Workstation: [id number]      Assessment/Plan:  INTERVAL HISTORY: VZV PCR sent not sure why the HSV PCR was cancelled   Principal Problem:   Herpetiform eruption Active Problems:   Dementia (HCC)   Acute encephalopathy   Cellulitis   Constipation   Hyponatremia   Disorientation   Delirium    Ebony Pierce is a  89 y.o. female with dementia who has been admitted with a herpetiform rash involving her buttocks right thigh and some area of her vulva concert concerning for potential varicella zoster versus herpes simplex.  #1 herpetiform rash:  Continue acyclovir .  Follow-up VZV PCR as I would like to find out the micro or the lab why the HSV PCR is been canceled in epic perhaps it has been sent by is a different order?  #2 encephalopathy: I do not have a strong suspicion that she has CNS involvement from this herpes infection but rather has superimposed delirium on top of her prior existing dementia.  It may be helpful though as she improves to get an MRI of the brain with and without contrast or CTs of the brain with contrast.  I would not embark on a lumbar puncture at this point  Maintain airborne contact precautions until unless we can prove this is not varicella-zoster OR the lesions have all scabbed over.  I personally spent a total of 50 minutes in the  care of the patient today including preparing to see the patient, getting/reviewing separately obtained history, performing a medically appropriate exam/evaluation, counseling and educating, and placing orders.    Evaluation of the patient requires complex antimicrobial therapy evaluation, counseling , isolation needs to reduce disease transmission and risk assessment and mitigation.      LOS: 1 day   Jomarie Fleeta Rothman 04/26/2024, 4:17 PM  "

## 2024-04-26 NOTE — Hospital Course (Signed)
 89 y/o with dementia, hearing loss, GERD brought to the hospital by family for a diaper rash on the buttocks, generalized weakness, increased confusion and poor oral intake.   In the ED: Temperature 100.6 degrees UA revealed RBCs and WBCs but no bacteria Respiratory panel was negative Chest x-ray clear CT scan of the abdomen and pelvis showed stranding in the mons pubis and right perineum and a large amount of stool CT head showed age-related atrophy Started on Zosyn 

## 2024-04-26 NOTE — TOC PASRR Note (Signed)
 30 Day PASRR Note   Patient Details  Name: Ebony Pierce Date of Birth: [birth year]   Transition of Care [facility]) CM/SW Contact:    Alfonse JONELLE Rex, RN Phone Number: 04/26/2024, 2:07 PM  To Whom It May Concern:  Please be advised that this patient will require a short-term nursing home stay - anticipated 30 days or less for rehabilitation and strengthening.   The plan is for return home.

## 2024-04-26 NOTE — Evaluation (Signed)
 Physical Therapy Evaluation Patient Details Name: Shatonya Passon MRN: [id number] DOB: 05-31-1935 Today's Date: 04/26/2024  History of Present Illness  89 yo female admitted with herpetiform eruption, AMS, fall. Hx of dementia, OAB, B12 defiency, GERD, hearing loss, reduced vision  Clinical Impression  On eval, pt required Total A +2 for mobility. Able to remove mitts for participation-pt was not agitated during session. Able to get pt to EOB to sit for a few minutes. She required Mod A for static sitting balance. Pt declined any standing attempts despite encouragement. Pt stated she was tired and fearful she might fall (translated through in-person interpreter). Assisted pt back to bed. No family present during session. Recommend TOC consult to discuss d/c plans with family. Patient will benefit from continued inpatient follow up therapy, <3 hours/day. If pt returns home, recommend HHPT if family is agreeable.      If plan is discharge home, recommend the following: Two people to help with walking and/or transfers;Two people to help with bathing/dressing/bathroom;Assistance with cooking/housework;Assist for transportation;Help with stairs or ramp for entrance   Can travel by private vehicle        Equipment Recommendations None recommended by PT  Recommendations for Other Services       Functional Status Assessment Patient has had a recent decline in their functional status and demonstrates the ability to make significant improvements in function in a reasonable and predictable amount of time.     Precautions / Restrictions Precautions Precautions: Fall Restrictions Weight Bearing Restrictions Per Provider Order: No      Mobility  Bed Mobility Overal bed mobility: Needs Assistance Bed Mobility: Supine to Sit, Sit to Supine     Supine to sit: Total assist, +2 for physical assistance, +2 for safety/equipment, HOB elevated Sit to supine: Total assist, +2 for physical assistance,  +2 for safety/equipment, HOB elevated   General bed mobility comments: Assist for trunk and bil LEs. Utilized bedpad to aid with getting pt to EOB. Mod A for static sitting balance.    Transfers                   General transfer comment: Pt declined despite encouragement from therapist and interpreter    Ambulation/Gait                  Stairs            Wheelchair Mobility     Tilt Bed    Modified Rankin (Stroke Patients Only)       Balance Overall balance assessment: Needs assistance, History of Falls   Sitting balance-Leahy Scale: Poor                                       Pertinent Vitals/Pain Pain Assessment Pain Assessment: Faces Faces Pain Scale: No hurt    Home Living Family/patient expects to be discharged to:: Private residence Living Arrangements: Children               Home Equipment: Agricultural Consultant (2 wheels)      Prior Function Prior Level of Function : Patient poor historian/Family not available             Mobility Comments: uses RW (per [employer] review)       Extremity/Trunk Assessment   Upper Extremity Assessment Upper Extremity Assessment: Defer to OT evaluation    Lower Extremity Assessment Lower Extremity Assessment: Difficult to  assess due to impaired cognition       Communication   Communication Factors Affecting Communication: Hearing impaired    Cognition Arousal: Lethargic Behavior During Therapy: WFL for tasks assessed/performed   PT - Cognitive impairments: History of cognitive impairments                         Following commands: Impaired Following commands impaired: Follows one step commands inconsistently     Cueing Cueing Techniques: Verbal cues, Tactile cues     General Comments      Exercises     Assessment/Plan    PT Assessment Patient needs continued PT services  PT Problem List Decreased strength;Decreased range of motion;Decreased  activity tolerance;Decreased balance;Decreased mobility;Decreased cognition;Decreased knowledge of use of DME;Pain;Decreased skin integrity       PT Treatment Interventions DME instruction;Gait training;Functional mobility training;Therapeutic activities;Therapeutic exercise;Patient/family education;Balance training    PT Goals (Current goals can be found in the Care Plan section)  Acute Rehab PT Goals PT Goal Formulation: Patient unable to participate in goal setting Time For Goal Achievement: 05/10/24 Potential to Achieve Goals: Good    Frequency Min 2X/week     Co-evaluation               AM-PAC PT 6 Clicks Mobility  Outcome Measure Help needed turning from your back to your side while in a flat bed without using bedrails?: Total Help needed moving from lying on your back to sitting on the side of a flat bed without using bedrails?: Total Help needed moving to and from a bed to a chair (including a wheelchair)?: Total Help needed standing up from a chair using your arms (e.g., wheelchair or bedside chair)?: Total Help needed to walk in hospital room?: Total Help needed climbing 3-5 steps with a railing? : Total 6 Click Score: 6    End of Session   Activity Tolerance: Patient tolerated treatment well;Patient limited by fatigue;Patient limited by lethargy Patient left: in bed;with call bell/phone within reach;with bed alarm set;with nursing/sitter in room   PT Visit Diagnosis: History of falling (Z91.81);Muscle weakness (generalized) (M62.81);Difficulty in walking, not elsewhere classified (R26.2)    Time: 8783-8769 PT Time Calculation (min) (ACUTE ONLY): 14 min   Charges:   PT Evaluation $PT Eval Low Complexity: 1 Low   PT General Charges $$ ACUTE PT VISIT: 1 Visit           Dannial SQUIBB, PT Acute Rehabilitation  Office: [phone]

## 2024-04-26 NOTE — Progress Notes (Signed)
" °  [facility] Progress Note  Patient: Ebony Pierce     FMW:[id number]  DOA: 04/24/2024   PCP: Manon Jester, DO       Brief hospital course: 89 y/o with dementia, hearing loss, GERD brought to the hospital by family for a diaper rash on the buttocks, generalized weakness, increased confusion and poor oral intake.  In the ED: Temperature 100.6 degrees UA revealed RBCs and WBCs but no bacteria Respiratory panel was negative Chest x-ray clear CT scan of the abdomen and pelvis showed stranding in the mons pubis and right perineum and a large amount of stool CT head showed age-related atrophy Started on Zosyn   Subjective:  No verbal response  Assessment and Plan: Principal Problem: Herpes infection   Dementia- Acute metabolic encephalopathy -Continue acyclovir  - Agree with ID, herpes encephalitis unlikely  Vit B 12 deficiency - B 12 < 150 may be contributing to cognitive issues Continue vitamin B12    Constipation - Large stool burden in colon and rectum could be contributing to discomfort, poor oral intake and agitation -Try Dulcolax suppository -Senna also ordered    Hyponatremia, dehydration - According to the history, she has had poor oral intake at home - Would continue normal saline until we can see that she has adequate oral intake  Daughter at bedside      Code Status: Limited: Do not attempt resuscitation (DNR) -DNR-LIMITED -Do Not Intubate/DNI  Total time on patient care: 40 min DVT prophylaxis:  enoxaparin  (LOVENOX ) injection 40 mg Start: 04/25/24 1000     Objective:   Vitals:   04/25/24 2243 04/26/24 0200 04/26/24 0603 04/26/24 1540  BP: (!) 147/82 (!) 153/85 128/67 102/68  Pulse: 69 81 67 73  Resp: 16 18 18 14   Temp:  98.6 F (37 C) 97.9 F (36.6 C) 98.2 F (36.8 C)  TempSrc:  Oral Oral Oral  SpO2: 95% 97% 99% 96%  Weight:      Height:       Filed Weights   04/24/24 1838  Weight: 56.7 kg   Exam: General exam: Appears  comfortable - sedated HEENT: oral mucosa moist Respiratory system: Clear to auscultation.  Cardiovascular system: S1 & S2 heard  Gastrointestinal system: Abdomen soft, non-tender, nondistended. Normal bowel sounds   GU: please see pictures below, mild edema of mons pubis noted Extremities: No cyanosis, clubbing or edema Psychiatry:  Mood & affect appropriate.           CBC: Recent Labs  Lab 04/24/24 1846 04/25/24 1040  WBC 5.0 6.2  NEUTROABS 2.5  --   HGB 14.2 13.4  HCT 43.0 41.1  MCV 89.0 89.2  PLT 207 200   Basic Metabolic Panel: Recent Labs  Lab 04/24/24 1846 04/25/24 1040  NA 133* 134*  K 3.8 4.0  CL 97* 101  CO2 25 23  GLUCOSE 100* 107*  BUN 18 15  CREATININE 0.73 0.76  CALCIUM 9.8 9.3     Scheduled Meds:  cyanocobalamin   1,000 mcg Subcutaneous Q0600   enoxaparin  (LOVENOX ) injection  40 mg Subcutaneous Q24H   melatonin  5 mg Oral QHS   mirabegron  ER  25 mg Oral Daily   mirtazapine   7.5 mg Oral QPM   polyethylene glycol  17 g Oral Daily   senna-docusate  2 tablet Oral QHS    Imaging and lab data personally reviewed   Author: Lonni SHAUNNA Dalton  04/26/2024 5:52 PM   "

## 2024-04-27 LAB — COMPREHENSIVE METABOLIC PANEL WITH GFR
ALT: 12 U/L (ref 0–44)
AST: 24 U/L (ref 15–41)
Albumin: 3.6 g/dL (ref 3.5–5.0)
Alkaline Phosphatase: 66 U/L (ref 38–126)
Anion gap: 11 (ref 5–15)
BUN: 12 mg/dL (ref 8–23)
CO2: 22 mmol/L (ref 22–32)
Calcium: 9.6 mg/dL (ref 8.9–10.3)
Chloride: 105 mmol/L (ref 98–111)
Creatinine, Ser: 0.69 mg/dL (ref 0.44–1.00)
GFR, Estimated: >60 mL/min
Glucose, Bld: 134 mg/dL — ABNORMAL HIGH (ref 70–99)
Potassium: 3.7 mmol/L (ref 3.5–5.1)
Sodium: 138 mmol/L (ref 135–145)
Total Bilirubin: 0.4 mg/dL (ref 0.0–1.2)
Total Protein: 7 g/dL (ref 6.5–8.1)

## 2024-04-27 LAB — CBC
HCT: 43 % (ref 36.0–46.0)
Hemoglobin: 13.8 g/dL (ref 12.0–15.0)
MCH: 28.8 pg (ref 26.0–34.0)
MCHC: 32.1 g/dL (ref 30.0–36.0)
MCV: 89.6 fL (ref 80.0–100.0)
Platelets: 268 10*3/uL (ref 150–400)
RBC: 4.8 MIL/uL (ref 3.87–5.11)
RDW: 13.2 % (ref 11.5–15.5)
WBC: 3.8 10*3/uL — ABNORMAL LOW (ref 4.0–10.5)
nRBC: 0 % (ref 0.0–0.2)

## 2024-04-27 NOTE — TOC Progression Note (Addendum)
 Transition of Care [facility]) - Progression Note    Patient Details  Name: Ebony Pierce MRN: [id number] Date of Birth: 01/08/1936  [facility] CM/SW Contact  Alfonse JONELLE Rex, RN Phone Number: 04/27/2024, 2:33 PM  Clinical Narrative:   Level 2 PASRR- per NCMust - QMHP Assignment  -04/27/24 3:45pm Met with pt's dtr, Brant, on unit, reviewed current SNF bed offers [facility], [facility], [facility]) Bo request CM to extend bed offer search, bed offer referral extended in SNF HUB, await bed offers.     Expected Discharge Plan: Home w Home Health Services                 Expected Discharge Plan and Services       Living arrangements for the past 2 months: Single Family Home                                       Social Drivers of Health (SDOH) Interventions SDOH Screenings   Food Insecurity: No Food Insecurity (04/25/2024)  Housing: Low Risk (04/25/2024)  Transportation Needs: No Transportation Needs (04/25/2024)  Utilities: Not At Risk (04/25/2024)  Alcohol Screen: Low Risk (06/28/2023)  Depression (PHQ2-9): Medium Risk (11/26/2023)  Financial Resource Strain: Low Risk (06/28/2023)  Physical Activity: Inactive (06/28/2023)  Social Connections: Socially Isolated (04/25/2024)  Stress: No Stress Concern Present (06/28/2023)  Tobacco Use: Low Risk (04/24/2024)  Health Literacy: Patient Unable To Answer (06/28/2023)    Readmission Risk Interventions    04/26/2024   11:26 AM  Readmission Risk Prevention Plan  Post Dischage Appt Complete  Medication Screening Complete  Transportation Screening Complete

## 2024-04-27 NOTE — Progress Notes (Signed)
 SLP Cancellation Note  Patient Details Name: Ebony Pierce MRN: [id number] DOB: 02/11/1936   Cancelled treatment:       Reason Eval/Treat Not Completed: Other (comment). Per RN, unfortunately patient speaks a form of Thai however not one that we have available to us  as an in person or video interpreter and family does not come to visit until around 5:00pm. She has baseline dementia and is now encephalopathic. Question if she would benefit from formal SLP services however will plan to f/u next date, as late in the pm as able. If unable to complete evaluation based on above, will contact family via phone to determine how close she is to baseline, etc and determine if she needs full eval.   [name] MA, CCC-SLP    [name] [name] 04/27/2024, 11:18 AM

## 2024-04-27 NOTE — Progress Notes (Signed)
" °  Progress Note   Patient: Ebony Pierce FMW:[id number] DOB: [birth year] DOA: 04/24/2024     2 DOS: the patient was seen and examined on 04/27/2024 10:30AM and 4:20PM      Brief hospital course: 89 y.o. F with dementia, lives with family, presented with 2 days fatigue, confusion and new buttock rash.  Found to have fever 100.3F.  Admitted for work up.      Assessment and Plan: Vesicular rash and fever After admission, it was noted that her rash was vesicular, and appeared to have a dermatomal distribution.  ID were consulted, one of the vesicles was unroofed, and fluid sent for HSV and VZV PCR - Follow-up PCR studies - Continue empiric acyclovir    Encephalopathy, acute metabolic History of dementia At baseline the patient has memory problems, but is interactive and cooperative with cares.  In the hospital she is disoriented and has trouble answering questions.  Remains confused, but more alert than yesterday.  Suspect colopathy is related to VZV infection, not encephalitis - Delirium precautions  Hyponatremia Resolved with fluids  Vitamin B12 deficiency -Continue vitamin B12 injections, day 2 of 3      Subjective: Interpreter unavailable for the patient.  Discussed symptoms with daughter at the bedside, who translated.  Patient reports that she is sore, down there, but unable to localize exactly.  No fever, no respiratory distress.     Physical Exam: BP (!) 155/84 (BP Location: Right Arm)   Pulse 66   Temp 98.1 F (36.7 C) (Axillary)   Resp 17   Ht 5' (1.524 m)   Wt 56.7 kg   SpO2 99%   BMI 24.41 kg/m   Thin elderly female, sitting up in bed, appears weak and tired RRR, no murmurs, no peripheral edema Respiratory normal, lungs clear without rales or wheezes Abdomen soft, no tenderness palpation or guarding, no ascites or distention Makes eye contact, answers some questions, but other answers are nonsensical.  She is oriented to her daughter, but not place or  situation.  Moves all extremities with normal strength and coordination   Data Reviewed: Patient metabolic panel shows normal electrolytes and renal function CBC shows mild leukopenia    Family Communication: Daughter at the bedside    Disposition: Status is: Inpatient         Author: Lonni SHAUNNA Dalton, MD 04/27/2024 4:45 PM  For on call review [email or web address]    "

## 2024-04-27 NOTE — Plan of Care (Signed)
   Problem: Coping: Goal: Level of anxiety will decrease Outcome: Progressing   Problem: Pain Managment: Goal: General experience of comfort will improve and/or be controlled Outcome: Progressing   Problem: Safety: Goal: Ability to remain free from injury will improve Outcome: Progressing

## 2024-04-27 NOTE — Plan of Care (Signed)
  Problem: Clinical Measurements: Goal: Diagnostic test results will improve Outcome: Progressing   Problem: Nutrition: Goal: Adequate nutrition will be maintained Outcome: Progressing   Problem: Skin Integrity: Goal: Risk for impaired skin integrity will decrease Outcome: Progressing   

## 2024-04-28 LAB — MISC LABCORP TEST (SEND OUT): Labcorp test code: 138651

## 2024-04-28 LAB — VARICELLA-ZOSTER BY PCR: Varicella-Zoster, PCR: POSITIVE — AB

## 2024-04-28 LAB — CULTURE, BLOOD (ROUTINE X 2)
Culture: NO GROWTH
Culture: NO GROWTH
Special Requests: ADEQUATE

## 2024-04-28 MED ORDER — VALACYCLOVIR HCL 500 MG PO TABS
1000.0000 mg | ORAL_TABLET | Freq: Two times a day (BID) | ORAL | Status: AC
  Administered 2024-04-28: 1000 mg via ORAL
  Filled 2024-04-28: qty 2

## 2024-04-28 NOTE — Progress Notes (Signed)
 Occupational Therapy Treatment Patient Details Name: Ebony Pierce MRN: [id number] DOB: [birth year] Today's Date: 04/28/2024   History of present illness 89 yo female admitted with herpetiform eruption, AMS, fall. Hx of dementia, OAB, B12 defiency, GERD, hearing loss, reduced vision   OT comments  Pt in bed upon therapy arrival with daughter present assisting with breakfast. In person translator not available although daughter able to translate and help with communication. Pt agreeable to work with therapy. Assisted pt to sit EOB and walk from bed to recliner. Required 2 person assist for mobility d/t to posterior lean and assist with RW management. Discussed with daughter discharge recommendation such as SNF versus home health. At this time, OT will continue to recommend SNF and will change recommendation if appropriate.       If plan is discharge home, recommend the following:  A lot of help with walking and/or transfers;A lot of help with bathing/dressing/bathroom;Supervision due to cognitive status;Help with stairs or ramp for entrance;Assistance with cooking/housework;Assist for transportation   Equipment Recommendations  BSC/3in1       Precautions / Restrictions Precautions Precautions: Fall;Other (comment) Recall of Precautions/Restrictions: Impaired Precaution/Restrictions Comments: dementia Restrictions Weight Bearing Restrictions Per Provider Order: No       Mobility Bed Mobility Overal bed mobility: Needs Assistance Bed Mobility: Supine to Sit     Supine to sit: +2 for physical assistance, +2 for safety/equipment, HOB elevated, Mod assist     General bed mobility comments: mod A x 2 for safety, daughter present and able to facilitate communication and increased time    Transfers Overall transfer level: Needs assistance Equipment used: Rolling walker (2 wheels) Transfers: Sit to/from Stand, Bed to chair/wheelchair/BSC Sit to Stand: Mod assist, +2 physical  assistance, +2 safety/equipment     Step pivot transfers: +2 physical assistance, +2 safety/equipment, Mod assist     General transfer comment: pt exhibited strong posterior lean with initial standing, able to modify posture minimally with cues and facilitation, increased time     Balance Overall balance assessment: Needs assistance, History of Falls Sitting-balance support: Bilateral upper extremity supported, Feet supported Sitting balance-Leahy Scale: Poor Sitting balance - Comments: min A to maintain balance with posterior lean Postural control: Posterior lean Standing balance support: Bilateral upper extremity supported, During functional activity, Reliant on assistive device for balance Standing balance-Leahy Scale: Poor Standing balance comment: min A x 2 for safety and balance posterior lean          ADL either performed or assessed with clinical judgement           Communication Communication Communication: Impaired Factors Affecting Communication: Hearing impaired;Non - English speaking, interpreter not available                              Frequency  Min 2X/week        Progress Toward Goals  OT Goals(current goals can now be found in the care plan section)  Progress towards OT goals: Progressing toward goals         Co-evaluation    PT/OT/SLP Co-Evaluation/Treatment: Yes Reason for Co-Treatment: To address functional/ADL transfers;Necessary to address cognition/behavior during functional activity PT goals addressed during session: Proper use of DME;Mobility/safety with mobility;Balance OT goals addressed during session: Proper use of Adaptive equipment and DME      AM-PAC OT 6 Clicks Daily Activity     Outcome Measure   Help from another person eating meals?: Total Help from another  person taking care of personal grooming?: Total Help from another person toileting, which includes using toliet, bedpan, or urinal?: Total Help from  another person bathing (including washing, rinsing, drying)?: Total Help from another person to put on and taking off regular upper body clothing?: Total Help from another person to put on and taking off regular lower body clothing?: Total 6 Click Score: 6    End of Session Equipment Utilized During Treatment: Gait belt;Rolling walker (2 wheels)  OT Visit Diagnosis: Muscle weakness (generalized) (M62.81);History of falling (Z91.81)   Activity Tolerance Patient tolerated treatment well   Patient Left in chair;with call bell/phone within reach;with nursing/sitter in room;with family/visitor present   Nurse Communication Mobility status        Time: 1002-1030 OT Time Calculation (min): 28 min  Charges: OT General Charges $OT Visit: 1 Visit OT Treatments $Therapeutic Activity: 8-22 mins  Leita Howell, OTR/L,CBIS  Supplemental OT - [facility] and [facility] Secure Chat Preferred    Jeremyah Jelley, Leita BIRCH 04/28/2024, 2:34 PM

## 2024-04-28 NOTE — Progress Notes (Signed)
" °  Progress Note   Patient: Ebony Pierce FMW:[id number] DOB: 12-30-1935 DOA: 04/24/2024     3 DOS: the patient was seen and examined on 04/28/2024 at 1:55PM      Brief hospital course: 89 y.o. F with dementia, lives with family, presented with 2 days fatigue, confusion and new vesicular buttock rash.  Found to have fever 100.37F.        Assessment and Plan: Herpes Zoster After admission, it was noted that her rash was vesicular, and appeared to have a dermatomal distribution.  ID were consulted, one of the vesicles was unroofed, and fluid sent for HSV and VZV PCR - Transition to Valtrex , EOT 2/12   Zoster encephalopathy History of dementia At baseline the patient has memory problems, but is interactive and cooperative with cares.  In the hospital she is disoriented and has trouble answering questions.  Remains confused, but more alert than yesterday.  Suspect colopathy is related to VZV infection, not encephalitis  Improving  Hyponatremia Resolved with fluids  Vitamin B12 deficiency -Continue vitamin B12 injections, day 3 of 3      Subjective: Improving.  Zoster PCR positive.       Physical Exam: BP (!) 154/82   Pulse 70   Temp 98.2 F (36.8 C) (Oral)   Resp 15   Ht 5' (1.524 m)   Wt 56.7 kg   SpO2 100%   BMI 24.41 kg/m   General: Pt is alert, awake, not in acute distress, sitting up being fed lunch Cardiovascular: RRR, nl S1-S2, no murmurs appreciated.   No LE edema.   Respiratory: Normal respiratory rate and rhythm.  CTAB without rales or wheezes. Abdominal: Abdomen soft and non-tender.  No distension or HSM.   Neuro/Psych: Moves upper extremities with normal strength.  Smiles, makes eye contact.  Unfortunately no interpreter available.    Skin: Rash improved   Data Reviewed: Zoster PCR positive Discussed with ID   Family Communication:      Disposition: Status is: Inpatient         Author: Lonni SHAUNNA Dalton, MD 04/28/2024 6:01  PM  For on call review [email or web address]    "

## 2024-04-28 NOTE — Progress Notes (Signed)
 Physical Therapy Treatment Patient Details Name: Ebony Pierce MRN: [id number] DOB: 1936-02-16 Today's Date: 04/28/2024   History of Present Illness 89 yo female admitted with herpetiform eruption, AMS, fall. Hx of dementia, OAB, B12 defiency, GERD, hearing loss, reduced vision    PT Comments   Pt admitted with above diagnosis. Pt in bed when therapist arrived. Daughter present and able to assist with communication, interpreter not available today. Pt agreeable to therapy intervention. Marily nurse present and nurse tech remained throughout intervention. Pt required mod A x 2 for supine to sit, mod A x 2 for sit to stand  from EOB with posterior lean, min A x 2 for safety, RW management and balance for 8 feet with RW to recliner. Pt left seated in recliner, all needs in place, daughter present. PT and OT communicated with daughter per d/c planning HH vs SNF. PT to continue to recommend SNF for short term rehab at this time.  Pt currently with functional limitations due to the deficits listed below (see PT Problem List). Pt will benefit from acute skilled PT to increase their independence and safety with mobility to allow discharge.      If plan is discharge home, recommend the following: Two people to help with walking and/or transfers;Two people to help with bathing/dressing/bathroom;Assistance with cooking/housework;Assist for transportation;Help with stairs or ramp for entrance   Can travel by private vehicle        Equipment Recommendations  None recommended by PT    Recommendations for Other Services       Precautions / Restrictions Precautions Precautions: Fall;Other (comment) Recall of Precautions/Restrictions: Impaired Precaution/Restrictions Comments: dementia Restrictions Weight Bearing Restrictions Per Provider Order: No     Mobility  Bed Mobility Overal bed mobility: Needs Assistance Bed Mobility: Supine to Sit     Supine to sit: +2 for physical assistance, +2 for  safety/equipment, HOB elevated, Mod assist     General bed mobility comments: mod A x 2 for safety, daughter present and able to facilitate communication and increased time    Transfers Overall transfer level: Needs assistance Equipment used: Rolling walker (2 wheels) Transfers: Sit to/from Stand Sit to Stand: Mod assist, +2 physical assistance, +2 safety/equipment           General transfer comment: pt exhibited strong posterior lean with initial standing, able to modify posture minimally with cues and facilitation, increased time    Ambulation/Gait Ambulation/Gait assistance: Min assist, +2 physical assistance, +2 safety/equipment Gait Distance (Feet): 10 Feet Assistive device: Rolling walker (2 wheels) Gait Pattern/deviations: Step-to pattern, Decreased step length - right, Decreased step length - left, Narrow base of support, Shuffle Gait velocity: decreased     General Gait Details: limited foot clearance, short stride length with PT assisting with RW manuvering, daughter providing cues and PT/OT providing A for balance with slight posterior lean   Stairs             Wheelchair Mobility     Tilt Bed    Modified Rankin (Stroke Patients Only)       Balance Overall balance assessment: Needs assistance, History of Falls Sitting-balance support: Bilateral upper extremity supported, Feet supported Sitting balance-Leahy Scale: Poor Sitting balance - Comments: min A to maintain balance with posteiror lean Postural control: Posterior lean Standing balance support: Bilateral upper extremity supported, During functional activity, Reliant on assistive device for balance Standing balance-Leahy Scale: Poor Standing balance comment: min A x 2 for safety and balance posterior lean  Communication Communication Communication: Impaired Factors Affecting Communication: Hearing impaired;Non - English speaking, interpreter not  available  Cognition Arousal: Lethargic Behavior During Therapy: Flat affect   PT - Cognitive impairments: History of cognitive impairments                         Following commands: Impaired Following commands impaired: Follows one step commands inconsistently    Cueing Cueing Techniques: Visual cues  Exercises      General Comments        Pertinent Vitals/Pain Pain Assessment Pain Assessment: No/denies pain    Home Living                          Prior Function            PT Goals (current goals can now be found in the care plan section) Acute Rehab PT Goals PT Goal Formulation: Patient unable to participate in goal setting Time For Goal Achievement: 05/10/24 Potential to Achieve Goals: Good Progress towards PT goals: Progressing toward goals    Frequency    Min 2X/week      PT Plan      Co-evaluation PT/OT/SLP Co-Evaluation/Treatment: Yes Reason for Co-Treatment: To address functional/ADL transfers;Necessary to address cognition/behavior during functional activity PT goals addressed during session: Proper use of DME;Mobility/safety with mobility;Balance OT goals addressed during session: ADL's and self-care;Strengthening/ROM;Proper use of Adaptive equipment and DME      AM-PAC PT 6 Clicks Mobility   Outcome Measure  Help needed turning from your back to your side while in a flat bed without using bedrails?: A Lot Help needed moving from lying on your back to sitting on the side of a flat bed without using bedrails?: A Lot Help needed moving to and from a bed to a chair (including a wheelchair)?: A Lot Help needed standing up from a chair using your arms (e.g., wheelchair or bedside chair)?: A Lot Help needed to walk in hospital room?: A Lot Help needed climbing 3-5 steps with a railing? : Total 6 Click Score: 11    End of Session Equipment Utilized During Treatment: Gait belt Activity Tolerance: Patient tolerated treatment  well;Patient limited by fatigue;No increased pain Patient left: with call bell/phone within reach;with nursing/sitter in room;in chair;with family/visitor present Nurse Communication: Mobility status PT Visit Diagnosis: History of falling (Z91.81);Muscle weakness (generalized) (M62.81);Difficulty in walking, not elsewhere classified (R26.2)     Time: 1002-1030 PT Time Calculation (min) (ACUTE ONLY): 28 min  Charges:    $Therapeutic Activity: 8-22 mins PT General Charges $$ ACUTE PT VISIT: 1 Visit                     [place], PT Acute Rehab    [place] VEAR Drone 04/28/2024, 12:41 PM

## 2024-04-28 NOTE — TOC Progression Note (Signed)
 Transition of Care [facility]) - Progression Note    Patient Details  Name: Ebony Pierce MRN: [id number] Date of Birth: 05/12/1935  Transition of Care [facility]) CM/SW Contact  Alfonse JONELLE Rex, RN Phone Number: 04/28/2024, 10:45 AM  Clinical Narrative:   Teams chat received from OT- thinking with the language barrier and dementia she would do a lot better at home with therapy coming to the home and working with her so family could translate,  home health aide would also be helpful. Daughter and husband work opposite shifts so it sounds like it may be possible for one of them to be home with her at all times. Will keep SNF in place for now.     Expected Discharge Plan: Home w Home Health Services                 Expected Discharge Plan and Services       Living arrangements for the past 2 months: Single Family Home                                       Social Drivers of Health (SDOH) Interventions SDOH Screenings   Food Insecurity: No Food Insecurity (04/25/2024)  Housing: Low Risk (04/25/2024)  Transportation Needs: No Transportation Needs (04/25/2024)  Utilities: Not At Risk (04/25/2024)  Alcohol Screen: Low Risk (06/28/2023)  Depression (PHQ2-9): Medium Risk (11/26/2023)  Financial Resource Strain: Low Risk (06/28/2023)  Physical Activity: Inactive (06/28/2023)  Social Connections: Socially Isolated (04/25/2024)  Stress: No Stress Concern Present (06/28/2023)  Tobacco Use: Low Risk (04/24/2024)  Health Literacy: Patient Unable To Answer (06/28/2023)    Readmission Risk Interventions    04/26/2024   11:26 AM  Readmission Risk Prevention Plan  Post Dischage Appt Complete  Medication Screening Complete  Transportation Screening Complete

## 2024-04-28 NOTE — Progress Notes (Signed)
 "       Subjective:  Patient cooperative and calm   Antibiotics:  Anti-infectives (From admission, onward)    Start     Dose/Rate Route Frequency Ordered Stop   04/28/24 1600  valACYclovir  (VALTREX ) tablet 1,000 mg        1,000 mg Oral 2 times daily 04/28/24 0843 05/05/24 0959   04/25/24 1200  acyclovir  (ZOVIRAX ) 500 mg in dextrose  5 % 100 mL IVPB  Status:  Discontinued        500 mg 110 mL/hr over 60 Minutes Intravenous 2 times daily 04/25/24 1042 04/28/24 0843   04/25/24 0545  piperacillin -tazobactam (ZOSYN ) IVPB 3.375 g  Status:  Discontinued        3.375 g 12.5 mL/hr over 240 Minutes Intravenous Every 8 hours 04/25/24 0452 04/25/24 1449   04/24/24 1945  piperacillin -tazobactam (ZOSYN ) IVPB 3.375 g        3.375 g 100 mL/hr over 30 Minutes Intravenous  Once 04/24/24 1936 04/24/24 2030       Medications: Scheduled Meds:  enoxaparin  (LOVENOX ) injection  40 mg Subcutaneous Q24H   melatonin  5 mg Oral QHS   mirabegron  ER  25 mg Oral Daily   mirtazapine   7.5 mg Oral QPM   polyethylene glycol  17 g Oral Daily   senna-docusate  2 tablet Oral QHS   valACYclovir   1,000 mg Oral BID   Continuous Infusions:   PRN Meds:.acetaminophen  **OR** acetaminophen , bisacodyl , haloperidol  lactate, HYDROcodone -acetaminophen  **OR** morphine  injection    Objective: Weight change:   Intake/Output Summary (Last 24 hours) at 04/28/2024 1258 Last data filed at 04/28/2024 1056 Gross per 24 hour  Intake 2853.81 ml  Output 1430 ml  Net 1423.81 ml   Blood pressure (!) 155/87, pulse 60, temperature 98.3 F (36.8 C), temperature source Oral, resp. rate 16, height 5' (1.524 m), weight 56.7 kg, SpO2 98%. Temp:  [97.9 F (36.6 C)-98.3 F (36.8 C)] 98.3 F (36.8 C) (02/06 0411) Pulse Rate:  [60-72] 60 (02/06 0411) Resp:  [14-17] 16 (02/06 0411) BP: (155-163)/(84-89) 155/87 (02/06 0411) SpO2:  [98 %-100 %] 98 % (02/06 0411)  Physical Exam: Physical Exam Exam conducted with a chaperone  present.  Cardiovascular:     Rate and Rhythm: Normal rate.  Pulmonary:     Effort: Pulmonary effort is normal. No respiratory distress.     Breath sounds: No wheezing.  Abdominal:     General: There is no distension.  Neurological:     General: No focal deficit present.     Mental Status: She is alert. She is disoriented.  Psychiatric:        Cognition and Memory: Cognition is impaired. Memory is impaired. She exhibits impaired recent memory.        Judgment: Judgment is impulsive.      Buttocks today 04/26/2024: more scabbing of lesions      Buttocks area largely bandaged 04/27/24:    04/26/24       CBC:    BMET Recent Labs    04/27/24 0345  NA 138  K 3.7  CL 105  CO2 22  GLUCOSE 134*  BUN 12  CREATININE 0.69  CALCIUM 9.6     Liver Panel  Recent Labs    04/27/24 0345  PROT 7.0  ALBUMIN  3.6  AST 24  ALT 12  ALKPHOS 66  BILITOT 0.4       Sedimentation Rate No results for input(s): ESRSEDRATE in the last 72 hours. C-Reactive Protein No results for input(s):  CRP in the last 72 hours.  Micro Results: Recent Results (from the past 720 hours)  Resp panel by RT-PCR (RSV, Flu A&B, Covid) Anterior Nasal Swab     Status: None   Collection Time: 04/24/24  6:45 PM   Specimen: Anterior Nasal Swab  Result Value Ref Range Status   SARS Coronavirus 2 by RT PCR NEGATIVE NEGATIVE Final    Comment: (NOTE) SARS-CoV-2 target nucleic acids are NOT DETECTED.  The SARS-CoV-2 RNA is generally detectable in upper respiratory specimens during the acute phase of infection. The lowest concentration of SARS-CoV-2 viral copies this assay can detect is 138 copies/mL. A negative result does not preclude SARS-Cov-2 infection and should not be used as the sole basis for treatment or other patient management decisions. A negative result may occur with  improper specimen collection/handling, submission of specimen other than nasopharyngeal swab, presence of viral  mutation(s) within the areas targeted by this assay, and inadequate number of viral copies(<138 copies/mL). A negative result must be combined with clinical observations, patient history, and epidemiological information. The expected result is Negative.  Fact Sheet for Patients:  bloggercourse.com  Fact Sheet for Healthcare Providers:  seriousbroker.it  This test is no t yet approved or cleared by the [place]  FDA and  has been authorized for detection and/or diagnosis of SARS-CoV-2 by FDA under an Emergency Use Authorization (EUA). This EUA will remain  in effect (meaning this test can be used) for the duration of the COVID-19 declaration under Section 564(b)(1) of the Act, 21 U.S.C.section 360bbb-3(b)(1), unless the authorization is terminated  or revoked sooner.       Influenza A by PCR NEGATIVE NEGATIVE Final   Influenza B by PCR NEGATIVE NEGATIVE Final    Comment: (NOTE) The Xpert Xpress SARS-CoV-2/FLU/RSV plus assay is intended as an aid in the diagnosis of influenza from Nasopharyngeal swab specimens and should not be used as a sole basis for treatment. Nasal washings and aspirates are unacceptable for Xpert Xpress SARS-CoV-2/FLU/RSV testing.  Fact Sheet for Patients: bloggercourse.com  Fact Sheet for Healthcare Providers: seriousbroker.it  This test is not yet approved or cleared by the [place]  FDA and has been authorized for detection and/or diagnosis of SARS-CoV-2 by FDA under an Emergency Use Authorization (EUA). This EUA will remain in effect (meaning this test can be used) for the duration of the COVID-19 declaration under Section 564(b)(1) of the Act, 21 U.S.C. section 360bbb-3(b)(1), unless the authorization is terminated or revoked.     Resp Syncytial Virus by PCR NEGATIVE NEGATIVE Final    Comment: (NOTE) Fact Sheet for  Patients: bloggercourse.com  Fact Sheet for Healthcare Providers: seriousbroker.it  This test is not yet approved or cleared by the [place]  FDA and has been authorized for detection and/or diagnosis of SARS-CoV-2 by FDA under an Emergency Use Authorization (EUA). This EUA will remain in effect (meaning this test can be used) for the duration of the COVID-19 declaration under Section 564(b)(1) of the Act, 21 U.S.C. section 360bbb-3(b)(1), unless the authorization is terminated or revoked.  Performed at [facility], [address] [place], [place] [address]   Blood culture (routine x 2)     Status: None (Preliminary result)   Collection Time: 04/24/24  8:02 PM   Specimen: BLOOD LEFT HAND  Result Value Ref Range Status   Specimen Description   Final    BLOOD LEFT HAND Performed at [facility], [address] [place], [place] [address]    Special  Requests   Final    BOTTLES DRAWN AEROBIC AND ANAEROBIC Blood Culture adequate volume Performed at [facility], [address] [place], [place] [address]    Culture   Final    NO GROWTH 4 DAYS Performed at [facility], [address] [place], [place] [address]    Report Status PENDING  Incomplete  Culture, blood (Routine X 2) w Reflex to ID Panel     Status: None (Preliminary result)   Collection Time: 04/25/24 10:41 AM   Specimen: BLOOD RIGHT HAND  Result Value Ref Range Status   Specimen Description   Final    BLOOD RIGHT HAND Performed at [facility], [address], [place], [place] [address]    Special Requests   Final    BOTTLES DRAWN AEROBIC ONLY Blood Culture results may not be optimal due to an inadequate volume of blood received in culture bottles Performed at [facility], [address] [place], [place] [address]    Culture   Final    NO GROWTH 2 DAYS Performed at [facility], [address], [place], [place] [address]    Report Status PENDING  Incomplete  Varicella-zoster by PCR     Status: Abnormal   Collection Time: 04/25/24  6:53 PM  Result Value Ref Range Status   Varicella-Zoster, PCR Positive (A) Negative Final    Comment: (NOTE) Varicella Zoster Virus DNA detected. This test was developed and its performance characteristics determined by [facility]. It has not been cleared or approved by the Food and Drug Administration. Performed At: [facility] [address] [place], [place] [id number] Jennette Shorter MD Ey:[id number]     Studies/Results: No results found.     Assessment/Plan:  INTERVAL HISTORY: VZV PCR +   Principal Problem:   Herpetiform eruption Active Problems:   Dementia (HCC)   Acute encephalopathy   Cellulitis   Constipation   Hyponatremia   Disorientation   Delirium    Ebony Pierce is a 89 y.o. female with dementia who has been admitted with a herpetiform rash involving her buttocks right thigh and some area of her vulva concert concerning for potential varicella zoster versus herpes simplex.  #1 Varicella Zoster/Shingles eruption  Switch from ACV to Valtrex  and complete 10 days of therapy  #2 IP: lesions appear to be scabbing over but would leave in airborne while still in the inpatient world for next few days  IF they scab up completely can DC airborne  Follow-up VZV PCR as I would like to find out the micro or the lab why the HSV PCR is been canceled in epic perhaps it has been sent by is a different order?  #2 encephalopathy: I do not have a strong suspicion that she has CNS involvement from this herpes infection but rather has superimposed delirium on top of her prior existing dementia.  It may be helpful though as she improves to get an MRI of the brain with and without contrast or CTs of the brain with contrast.  I personally spent a total of 50 minutes in the care of the patient today  including preparing to see the patient, getting/reviewing separately obtained history, performing a medically appropriate exam/evaluation, counseling and educating, placing orders, referring and communicating with other health care professionals, documenting clinical information in the EHR, and independently interpreting results.  Evaluation of the patient requires complex antimicrobial therapy evaluation, counseling , isolation needs to reduce disease transmission and risk  assessment and mitigation.       LOS: 3 days   Ebony Pierce 04/28/2024, 12:58 PM  "

## 2024-04-28 NOTE — TOC Progression Note (Addendum)
 Transition of Care [facility]) - Progression Note    Patient Details  Name: Ebony Pierce MRN: [id number] Date of Birth: 1935-04-15  Transition of Care [facility]) CM/SW Contact  Alfonse JONELLE Rex, RN Phone Number: 04/28/2024, 12:23 PM  Clinical Narrative:   met with patient's dtr, Bo, on unit, reviewed SNF bed offers [facility], [place], [facility], [facility], [facility]), Brant states she would like to take list home to discuss with her family, also weighing option of taking patient home with [facility] services PT/OT/RN/HHA, stares would like to have a hospital bed if patient's discharges home as patient's bed is on second level, team updated. CM will continue to follow.   -1:47pm Call from pt's dtr, Bo, accepted bed offer at [facility]. Per Brittany, investment banker, operational, sitter needs to be discontinued x 48 hours before can admit, team notified.     Expected Discharge Plan: Home w Home Health Services                 Expected Discharge Plan and Services       Living arrangements for the past 2 months: Single Family Home                                       Social Drivers of Health (SDOH) Interventions SDOH Screenings   Food Insecurity: No Food Insecurity (04/25/2024)  Housing: Low Risk (04/25/2024)  Transportation Needs: No Transportation Needs (04/25/2024)  Utilities: Not At Risk (04/25/2024)  Alcohol Screen: Low Risk (06/28/2023)  Depression (PHQ2-9): Medium Risk (11/26/2023)  Financial Resource Strain: Low Risk (06/28/2023)  Physical Activity: Inactive (06/28/2023)  Social Connections: Socially Isolated (04/25/2024)  Stress: No Stress Concern Present (06/28/2023)  Tobacco Use: Low Risk (04/24/2024)  Health Literacy: Patient Unable To Answer (06/28/2023)    Readmission Risk Interventions    04/26/2024   11:26 AM  Readmission Risk Prevention Plan  Post Dischage Appt Complete  Medication Screening Complete  Transportation Screening Complete

## 2024-06-29 ENCOUNTER — Encounter
